# Patient Record
Sex: Male | Born: 1980 | Race: Black or African American | Hispanic: No | Marital: Married | Smoking: Current some day smoker
Health system: Southern US, Community
[De-identification: ages and names within clinical notes are randomized; demographics above are authoritative.]

## PROBLEM LIST (undated history)

## (undated) DIAGNOSIS — F419 Anxiety disorder, unspecified: Secondary | ICD-10-CM

## (undated) DIAGNOSIS — I10 Essential (primary) hypertension: Secondary | ICD-10-CM

## (undated) DIAGNOSIS — K219 Gastro-esophageal reflux disease without esophagitis: Secondary | ICD-10-CM

## (undated) DIAGNOSIS — M1990 Unspecified osteoarthritis, unspecified site: Secondary | ICD-10-CM

## (undated) DIAGNOSIS — J45909 Unspecified asthma, uncomplicated: Secondary | ICD-10-CM

## (undated) DIAGNOSIS — R748 Abnormal levels of other serum enzymes: Secondary | ICD-10-CM

## (undated) DIAGNOSIS — D649 Anemia, unspecified: Secondary | ICD-10-CM

## (undated) DIAGNOSIS — T4145XA Adverse effect of unspecified anesthetic, initial encounter: Secondary | ICD-10-CM

## (undated) DIAGNOSIS — F1010 Alcohol abuse, uncomplicated: Secondary | ICD-10-CM

## (undated) DIAGNOSIS — T8859XA Other complications of anesthesia, initial encounter: Secondary | ICD-10-CM

## (undated) HISTORY — PX: EYE SURGERY: SHX253

## (undated) HISTORY — PX: MOUTH SURGERY: SHX715

## (undated) HISTORY — PX: WISDOM TOOTH EXTRACTION: SHX21

## (undated) HISTORY — PX: KNEE SURGERY: SHX244

---

## 2007-09-08 ENCOUNTER — Emergency Department: Payer: Self-pay | Admitting: Unknown Physician Specialty

## 2009-12-26 ENCOUNTER — Inpatient Hospital Stay: Payer: Self-pay | Admitting: Psychiatry

## 2011-06-16 ENCOUNTER — Ambulatory Visit: Payer: Self-pay | Admitting: Sports Medicine

## 2011-11-01 ENCOUNTER — Ambulatory Visit: Payer: Self-pay | Admitting: Orthopedic Surgery

## 2015-02-15 NOTE — Op Note (Signed)
PATIENT NAME:  Russell Hansen, Russell Hansen MR#:  [id number] DATE OF BIRTH:  [birth year]  DATE OF PROCEDURE:  11/01/2011  PREOPERATIVE DIAGNOSIS: Right knee anterior cruciate ligament tear.   POSTOPERATIVE DIAGNOSIS: Right knee anterior cruciate ligament tear.   PROCEDURE: Arthroscopically assisted anterior cruciate ligament reconstruction using autograft patellar bone tendon bone.   SURGEON: Leitha Schuller, MD  ASSISTANT: Thompson Grayer, PA-C.   ANESTHESIA: General.    DESCRIPTION OF PROCEDURE: Patient brought to the Operating Room and after adequate anesthesia was obtained, the leg was placed in the [facility] legholder. After patient identification and timeout procedures were completed, an inferolateral portal was made and the arthroscope was introduced. Inspection revealed normal-appearing patellofemoral joint. No significant arthritis or cartilage wear. Coming around medially, an inferomedial portal was made and on probing the medial meniscus was intact as was the articular cartilage. The anterior cruciate ligament was torn off the femoral attachment with complete tear. It was stuck down to the posterior cruciate ligament and was totally incompetent and had complete rupture. The lateral compartment was also normal with intact lateral meniscus. Gutters had no loose bodies. At this point, the old anterior cruciate ligament was debrided with a small residual stump left for placement of the tibial tunnel subsequently. The lateral wall was also cleaned off of soft tissue attachments. At this point the arthroscope was withdrawn and the patellar tendon autograft obtained. A midline skin incision was made followed by elevation of the peritenon and exposure of the tendon, measured approximately 35 mm at the distal end. A 10 mm graft harvesting scalpel was used then to cut the tendon longitudinally and resection of approximately 20 mm of bone off the tibia and approximately 18 mm off the patella was obtained.  After contouring the graft it fit through 10 mm trial tunnels. Drill holes were placed in the graft for subsequent passage of the graft. Bone graft was obtained from the proximal tibia to be impacted in the space of the patella to try to minimize the defect. There is no penetration into the joint the patellar harvesting. The peritenon was repaired after loosely closing the gap in the tendon followed by 2-0 closure of the skin. After closure of the peritenon the inferior medial aspect of the incision was subsequently used for the tibial tunnel. No additional incision was needed for that. After measuring and seeing that the graft could pass through a 10 mm tunnel guidewire was inserted through the Arthrex guide to the center of the anterior cruciate ligament footprint on the tibia and a 10 mm hole made. The residual anterior cruciate ligament stump was removed to prevent impingement as the graft would be passed. Through the tibial tunnel transtibial guide was inserted and at about a 11 o'clock position going through the old footprint of the anterior cruciate ligament Beath needle was driven through the femur out the anterolateral thigh in an appropriate position. This was then reamed with a 10 mm acorn reamer and the graft then passed. An 8 x 23 mm Milagro interference screw was inserted and this gave stable fixation on the femoral side. On the tibial side a large portion of the bony graft was really sticking outside the bone tunnel and it was felt the interference screw fixation would not be appropriate and so a bone staple was used to fix the graft on the tibial side first using an osteotome to make a slightly recessed bed so that the bone would not be so prominent. With the graft tensioned in extension  the staple was applied and a suture from the tendon was passed around the staple for additional fixation as a post. The Lachman was very stable at this point and the anterior cruciate ligament did not show signs of  impingement in extension. On examination arthroscopically the graft appeared stable and appropriate. The knee was thoroughly irrigated. The skin incision was closed with using 2-0 Vicryl and then skin staples. 20 mL of 0.5% Sensorcaine with epinephrine was infiltrated in the area of the incisions and into the joint. The wound was then dressed with Xeroform, 4 x 4's, ABD, Webril, ace wrap and knee immobilizer along with a Polar Care. The patient was sent to recovery in stable condition. Tourniquet was let down at the close of the case. It was raised harvesting the patellar tendon. Total tourniquet time was 85 minutes at 300 mmHg.   ESTIMATED BLOOD LOSS: 50.   COMPLICATIONS: None.   SPECIMEN: None.   IMPLANTS: 8 x 23 Milagro interference screw from [employer] and one Zimmer bone staple.   ____________________________ Leitha Schuller, MD mjm:cms D: 11/01/2011 21:21:59 ET T: 11/02/2011 08:27:41 ET JOB#: [id number]  cc: Leitha Schuller, MD, <Dictator> Leitha Schuller MD ELECTRONICALLY SIGNED 11/02/2011 12:28

## 2016-08-02 ENCOUNTER — Emergency Department
Admission: EM | Admit: 2016-08-02 | Discharge: 2016-08-02 | Disposition: A | Discharge status: Home / Self Care | Payer: BC Managed Care – PPO | Attending: Emergency Medicine | Admitting: Emergency Medicine

## 2016-08-02 ENCOUNTER — Encounter: Payer: Self-pay | Admitting: Emergency Medicine

## 2016-08-02 DIAGNOSIS — R42 Dizziness and giddiness: Secondary | ICD-10-CM | POA: Diagnosis present

## 2016-08-02 DIAGNOSIS — I10 Essential (primary) hypertension: Secondary | ICD-10-CM | POA: Diagnosis not present

## 2016-08-02 HISTORY — DX: Anxiety disorder, unspecified: F41.9

## 2016-08-02 HISTORY — DX: Essential (primary) hypertension: I10

## 2016-08-02 HISTORY — DX: Alcohol abuse, uncomplicated: F10.10

## 2016-08-02 LAB — COMPREHENSIVE METABOLIC PANEL
ALK PHOS: 98 U/L (ref 38–126)
ALT: 181 U/L — AB (ref 17–63)
ANION GAP: 11 (ref 5–15)
AST: 498 U/L — AB (ref 15–41)
Albumin: 4.3 g/dL (ref 3.5–5.0)
BILIRUBIN TOTAL: 1.3 mg/dL — AB (ref 0.3–1.2)
BUN: 8 mg/dL (ref 6–20)
CHLORIDE: 105 mmol/L (ref 101–111)
CO2: 22 mmol/L (ref 22–32)
Calcium: 9.3 mg/dL (ref 8.9–10.3)
Creatinine, Ser: 1.03 mg/dL (ref 0.61–1.24)
GFR calc Af Amer: >60 mL/min (ref >60)
Glucose, Bld: 115 mg/dL — ABNORMAL HIGH (ref 65–99)
POTASSIUM: 3.2 mmol/L — AB (ref 3.5–5.1)
Sodium: 138 mmol/L (ref 135–145)
TOTAL PROTEIN: 7.7 g/dL (ref 6.5–8.1)

## 2016-08-02 LAB — CBC
HEMATOCRIT: 45.1 % (ref 40.0–52.0)
HEMOGLOBIN: 15.6 g/dL (ref 13.0–18.0)
MCH: 34.6 pg — ABNORMAL HIGH (ref 26.0–34.0)
MCHC: 34.6 g/dL (ref 32.0–36.0)
MCV: 100.2 fL — AB (ref 80.0–100.0)
Platelets: 333 10*3/uL (ref 150–440)
RBC: 4.5 MIL/uL (ref 4.40–5.90)
RDW: 13.2 % (ref 11.5–14.5)
WBC: 7.2 10*3/uL (ref 3.8–10.6)

## 2016-08-02 LAB — TROPONIN I: Troponin I: <0.03 ng/mL (ref <0.03)

## 2016-08-02 MED ORDER — LORAZEPAM 1 MG PO TABS
ORAL_TABLET | ORAL | Status: AC
  Administered 2016-08-02: 1 mg
  Filled 2016-08-02: qty 1

## 2016-08-02 MED ORDER — SODIUM CHLORIDE 0.9 % IV BOLUS (SEPSIS)
1000.0000 mL | Freq: Once | INTRAVENOUS | Status: AC
  Administered 2016-08-02: 1000 mL via INTRAVENOUS

## 2016-08-02 MED ORDER — SODIUM CHLORIDE 0.9 % IV SOLN
1000.0000 mL | Freq: Once | INTRAVENOUS | Status: AC
  Administered 2016-08-02: 1000 mL via INTRAVENOUS

## 2016-08-02 NOTE — ED Provider Notes (Signed)
[facility] Emergency Department Provider Note   ____________________________________________    I have reviewed the triage vital signs and the nursing notes.   HISTORY  Chief Complaint Dizziness    HPI Russell Hansen is a 35 y.o. male who presents with dizziness. Patient reports that over the last 2 days when he has stood up he has felt lightheaded and dizzy. He felt like he may lose his balance. He does have a history of alcohol abuse in the past but denies heavy drinking over the last several days. He reports he drank one beer 4 days ago. No chest pain, no nausea vomiting or palpitations. No history of significant alcohol withdrawal.   Past Medical History:  Diagnosis Date  . Alcohol abuse   . Anxiety   . Hypertension     There are no active problems to display for this patient.   Past Surgical History:  Procedure Laterality Date  . KNEE SURGERY      Prior to Admission medications   Not on File     Allergies Doxycycline  No family history on file.  Social History Social History  Substance Use Topics  . Smoking status: Never Smoker  . Smokeless tobacco: Never Used  . Alcohol use Yes    Review of Systems  Constitutional: No fever/chills Eyes: No visual changes.   Cardiovascular: Denies chest pain.No palpitations Respiratory: Denies shortness of breath. Gastrointestinal: No abdominal pain.  No nausea, no vomiting.   Genitourinary: Negative for hematuria Musculoskeletal: Negative for back pain. Skin: Negative for rash. Neurological: Negative for headaches or weakness  10-point ROS otherwise negative.  ____________________________________________   PHYSICAL EXAM:  VITAL SIGNS: ED Triage Vitals  Enc Vitals Group     BP 08/02/16 0713 (!) 136/118     Pulse Rate 08/02/16 0713 (!) 127     Resp 08/02/16 0713 16     Temp 08/02/16 0713 97.6 F (36.4 C)     Temp Source 08/02/16 0713 Oral     SpO2 08/02/16 0713 96  %     Weight 08/02/16 0714 150 lb (68 kg)     Height 08/02/16 0714 5\' 8"  (1.727 m)     Head Circumference --      Peak Flow --      Pain Score 08/02/16 0718 0     Pain Loc --      Pain Edu? --      Excl. in GC? --     Constitutional: Alert and oriented. No acute distress. Pleasant and interactive Eyes: Conjunctivae are normal. No nystagmus  Nose: No congestion/rhinnorhea. Mouth/Throat: Mucous membranes are moist.    Cardiovascular: Normal rate, regular rhythm. Grossly normal heart sounds.  Good peripheral circulation. Respiratory: Normal respiratory effort.  No retractions. Lungs CTAB. Gastrointestinal: Soft and nontender. No distention.  No CVA tenderness. Genitourinary: deferred Musculoskeletal: No lower extremity tenderness nor edema.  Warm and well perfused Neurologic:  Normal speech and language. No gross focal neurologic deficits are appreciated.  Skin:  Skin is warm, dry and intact. No rash noted. Psychiatric: Mood and affect are normal. Speech and behavior are normal.  ____________________________________________   LABS (all labs ordered are listed, but only abnormal results are displayed)  Labs Reviewed  CBC  COMPREHENSIVE METABOLIC PANEL  TROPONIN I   ____________________________________________  EKG  ED ECG REPORT I, Jene Every, the attending physician, personally viewed and interpreted this ECG.  Date: 08/02/2016 EKG Time: 7:47 AM Rate: 83 Rhythm: normal sinus rhythm  QRS Axis: normal Intervals: normal ST/T Wave abnormalities: normal Conduction Disturbances: none Narrative Interpretation: unremarkable  ____________________________________________  RADIOLOGY  None ____________________________________________   PROCEDURES  Procedure(s) performed: No    Critical Care performed:No ____________________________________________   INITIAL IMPRESSION / ASSESSMENT AND PLAN / ED COURSE  Pertinent labs & imaging results that were available  during my care of the patient were reviewed by me and considered in my medical decision making (see chart for details).  Patient well-appearing and in no distress. Initial heart rate was elevated however when lying in bed patient's heart rate is quite normal. Patient appears calm, no shaking. Blood pressure is elevated but he has a history of hypertension and takes amlodipine for this. We will check orthostatics, labs, give IV fluids and recheck.  Clinical Course  After IV fluids, patient felt significantly better. He no longer has any dizziness upon standing. He continues to have a heart rate which fluctuates rapidly especially when medical provider walks into the room which I suspect is related to anxiety. We discussed outpatient follow-up and need to return if any change in symptoms. He agrees with this plan. No evidence that his symptoms are related to alcohol withdrawal ____________________________________________   FINAL CLINICAL IMPRESSION(S) / ED DIAGNOSES  Final diagnoses:  Dizziness      NEW MEDICATIONS STARTED DURING THIS VISIT:  New Prescriptions   No medications on file     Note:  This document was prepared using Dragon voice recognition software and may include unintentional dictation errors.    Jene Everyobert Arretta Toenjes, MD 08/02/16 [phone]

## 2016-08-02 NOTE — ED Triage Notes (Signed)
Pt reports being in alcohol withdrawal. Pt reports last alcoholic drink. Pt requesting help with sx of withdrawal. Pt denies SI, HI or hallucinations.

## 2016-10-11 ENCOUNTER — Other Ambulatory Visit: Payer: Self-pay | Admitting: Student

## 2016-10-11 DIAGNOSIS — R7989 Other specified abnormal findings of blood chemistry: Secondary | ICD-10-CM

## 2016-10-11 DIAGNOSIS — R945 Abnormal results of liver function studies: Principal | ICD-10-CM

## 2016-10-13 ENCOUNTER — Ambulatory Visit: Payer: BC Managed Care – PPO

## 2016-10-14 ENCOUNTER — Ambulatory Visit
Admission: RE | Admit: 2016-10-14 | Discharge: 2016-10-14 | Disposition: A | Discharge status: Home / Self Care | Payer: BC Managed Care – PPO | Source: Ambulatory Visit | Attending: Student | Admitting: Student

## 2016-10-14 DIAGNOSIS — R7989 Other specified abnormal findings of blood chemistry: Secondary | ICD-10-CM

## 2016-10-14 DIAGNOSIS — R932 Abnormal findings on diagnostic imaging of liver and biliary tract: Secondary | ICD-10-CM | POA: Diagnosis not present

## 2016-10-14 DIAGNOSIS — R945 Abnormal results of liver function studies: Secondary | ICD-10-CM

## 2017-09-04 DIAGNOSIS — E559 Vitamin D deficiency, unspecified: Secondary | ICD-10-CM | POA: Insufficient documentation

## 2017-09-04 HISTORY — DX: Vitamin D deficiency, unspecified: E55.9

## 2017-09-05 DIAGNOSIS — R809 Proteinuria, unspecified: Secondary | ICD-10-CM | POA: Insufficient documentation

## 2017-09-05 HISTORY — DX: Proteinuria, unspecified: R80.9

## 2018-01-01 ENCOUNTER — Other Ambulatory Visit: Payer: Self-pay | Admitting: Orthopedic Surgery

## 2018-01-01 DIAGNOSIS — S83261A Peripheral tear of lateral meniscus, current injury, right knee, initial encounter: Secondary | ICD-10-CM

## 2018-01-17 ENCOUNTER — Ambulatory Visit
Admission: RE | Admit: 2018-01-17 | Discharge: 2018-01-17 | Disposition: A | Discharge status: Home / Self Care | Payer: BC Managed Care – PPO | Source: Ambulatory Visit | Attending: Orthopedic Surgery | Admitting: Orthopedic Surgery

## 2018-01-17 DIAGNOSIS — S83261A Peripheral tear of lateral meniscus, current injury, right knee, initial encounter: Secondary | ICD-10-CM | POA: Diagnosis not present

## 2018-01-17 DIAGNOSIS — Z9889 Other specified postprocedural states: Secondary | ICD-10-CM | POA: Diagnosis not present

## 2018-01-17 DIAGNOSIS — Z9689 Presence of other specified functional implants: Secondary | ICD-10-CM | POA: Insufficient documentation

## 2018-02-26 ENCOUNTER — Encounter
Admission: RE | Admit: 2018-02-26 | Discharge: 2018-02-26 | Disposition: A | Discharge status: Home / Self Care | Payer: BC Managed Care – PPO | Source: Ambulatory Visit | Attending: Orthopedic Surgery | Admitting: Orthopedic Surgery

## 2018-02-26 ENCOUNTER — Other Ambulatory Visit: Payer: Self-pay

## 2018-02-26 HISTORY — DX: Other complications of anesthesia, initial encounter: T88.59XA

## 2018-02-26 HISTORY — DX: Unspecified asthma, uncomplicated: J45.909

## 2018-02-26 HISTORY — DX: Adverse effect of unspecified anesthetic, initial encounter: T41.45XA

## 2018-02-26 HISTORY — DX: Gastro-esophageal reflux disease without esophagitis: K21.9

## 2018-02-26 NOTE — Patient Instructions (Signed)
Your procedure is scheduled on: 03-05-18 MONDAY Report to Same Day Surgery 2nd floor medical mall [facility] Entrance-take elevator on left to 2nd floor.  Check in with surgery information desk.) To find out your arrival time please call [phone] between 1PM - 3PM on 03-02-18 FRIDAY  Remember: Instructions that are not followed completely may result in serious medical risk, up to and including death, or upon the discretion of your surgeon and anesthesiologist your surgery may need to be rescheduled.    _x___ 1. Do not eat food after midnight the night before your procedure. NO GUM OR CANDY AFTER MIDNIGHT.  You may drink clear liquids up to 2 hours before you are scheduled to arrive at the hospital for your procedure.  Do not drink clear liquids within 2 hours of your scheduled arrival to the hospital.  Clear liquids include  --Water or Apple juice without pulp  --Clear carbohydrate beverage such as ClearFast or Gatorade  --Black Coffee or Clear Tea (No milk, no creamers, do not add anything to the coffee or Tea     __x__ 2. No Alcohol for 24 hours before or after surgery.   __x__3. No Smoking or e-cigarettes for 24 prior to surgery.  Do not use any chewable tobacco products for at least 6 hour prior to surgery   ____  4. Bring all medications with you on the day of surgery if instructed.    __x__ 5. Notify your doctor if there is any change in your medical condition     (cold, fever, infections).    x___6. On the morning of surgery brush your teeth with toothpaste and water.  You may rinse your mouth with mouth wash if you wish.  Do not swallow any toothpaste or mouthwash.   Do not wear jewelry, make-up, hairpins, clips or nail polish.  Do not wear lotions, powders, or perfumes. You may wear deodorant.  Do not shave 48 hours prior to surgery. Men may shave face and neck.  Do not bring valuables to the hospital.    [facility] is not responsible for any belongings or  valuables.               Contacts, dentures or bridgework may not be worn into surgery.  Leave your suitcase in the car. After surgery it may be brought to your room.  For patients admitted to the hospital, discharge time is determined by your treatment team.  _  Patients discharged the day of surgery will not be allowed to drive home.  You will need someone to drive you home and stay with you the night of your procedure.    Please read over the following fact sheets that you were given:   [facility] Preparing for Surgery and or MRSA Information   _x___ TAKE THE FOLLOWING MEDICATION THE MORNING OF SURGERY WITH A SMALL SIP OF WATER. These include:  1. AMLODIPINE (NORVASC)  2. PEPCID  3. TAKE A PEPCID THE NIGHT BEFORE YOUR SURGERY  4.  5.  6.  ____Fleets enema or Magnesium Citrate as directed.   _x___ Use CHG Soap or sage wipes as directed on instruction sheet   ____ Use inhalers on the day of surgery and bring to hospital day of surgery  ____ Stop Metformin and Janumet 2 days prior to surgery.    ____ Take 1/2 of usual insulin dose the night before surgery and none on the morning surgery.   ____ Follow recommendations from Cardiologist, Pulmonologist or PCP  regarding stopping Aspirin, Coumadin, Plavix ,Eliquis, Effient, or Pradaxa, and Pletal.  X____Stop Anti-inflammatories such as Advil, Aleve, Ibuprofen, Motrin, Naproxen, Naprosyn, Goodies powders or aspirin products NOW-OK to take Tylenol    ____ Stop supplements until after surgery.     ____ Bring C-Pap to the hospital.

## 2018-02-27 ENCOUNTER — Encounter
Admission: RE | Admit: 2018-02-27 | Discharge: 2018-02-27 | Disposition: A | Discharge status: Home / Self Care | Payer: BC Managed Care – PPO | Source: Ambulatory Visit | Attending: Orthopedic Surgery | Admitting: Orthopedic Surgery

## 2018-02-27 DIAGNOSIS — I119 Hypertensive heart disease without heart failure: Secondary | ICD-10-CM | POA: Insufficient documentation

## 2018-02-27 DIAGNOSIS — X58XXXA Exposure to other specified factors, initial encounter: Secondary | ICD-10-CM | POA: Insufficient documentation

## 2018-02-27 DIAGNOSIS — S83241A Other tear of medial meniscus, current injury, right knee, initial encounter: Secondary | ICD-10-CM | POA: Insufficient documentation

## 2018-03-02 ENCOUNTER — Encounter: Payer: Self-pay | Admitting: *Deleted

## 2018-03-04 MED ORDER — CEFAZOLIN SODIUM-DEXTROSE 1-4 GM/50ML-% IV SOLN
1.0000 g | Freq: Once | INTRAVENOUS | Status: AC
  Administered 2018-03-05: 1 g via INTRAVENOUS

## 2018-03-05 ENCOUNTER — Ambulatory Visit: Payer: BC Managed Care – PPO | Admitting: Anesthesiology

## 2018-03-05 ENCOUNTER — Ambulatory Visit: Payer: BC Managed Care – PPO

## 2018-03-05 ENCOUNTER — Encounter
Admission: RE | Disposition: A | Discharge status: Home / Self Care | Payer: Self-pay | Source: Ambulatory Visit | Attending: Orthopedic Surgery

## 2018-03-05 ENCOUNTER — Other Ambulatory Visit: Payer: Self-pay

## 2018-03-05 ENCOUNTER — Ambulatory Visit
Admission: RE | Admit: 2018-03-05 | Discharge: 2018-03-05 | Disposition: A | Discharge status: Home / Self Care | Payer: BC Managed Care – PPO | Source: Ambulatory Visit | Attending: Orthopedic Surgery | Admitting: Orthopedic Surgery

## 2018-03-05 DIAGNOSIS — K219 Gastro-esophageal reflux disease without esophagitis: Secondary | ICD-10-CM | POA: Insufficient documentation

## 2018-03-05 DIAGNOSIS — Z79899 Other long term (current) drug therapy: Secondary | ICD-10-CM | POA: Diagnosis not present

## 2018-03-05 DIAGNOSIS — M1711 Unilateral primary osteoarthritis, right knee: Secondary | ICD-10-CM | POA: Diagnosis not present

## 2018-03-05 DIAGNOSIS — I10 Essential (primary) hypertension: Secondary | ICD-10-CM | POA: Insufficient documentation

## 2018-03-05 DIAGNOSIS — F419 Anxiety disorder, unspecified: Secondary | ICD-10-CM | POA: Insufficient documentation

## 2018-03-05 DIAGNOSIS — Z881 Allergy status to other antibiotic agents status: Secondary | ICD-10-CM | POA: Diagnosis not present

## 2018-03-05 DIAGNOSIS — M23221 Derangement of posterior horn of medial meniscus due to old tear or injury, right knee: Secondary | ICD-10-CM | POA: Insufficient documentation

## 2018-03-05 DIAGNOSIS — J45990 Exercise induced bronchospasm: Secondary | ICD-10-CM | POA: Diagnosis not present

## 2018-03-05 DIAGNOSIS — R0902 Hypoxemia: Secondary | ICD-10-CM

## 2018-03-05 HISTORY — PX: CHONDROPLASTY: SHX5177

## 2018-03-05 HISTORY — DX: Abnormal levels of other serum enzymes: R74.8

## 2018-03-05 HISTORY — PX: KNEE ARTHROSCOPY WITH MENISCAL REPAIR: SHX5653

## 2018-03-05 SURGERY — ARTHROSCOPY, KNEE, WITH MENISCUS REPAIR
Anesthesia: General | Site: Knee | Laterality: Right | Wound class: Clean

## 2018-03-05 MED ORDER — HYDROMORPHONE HCL 1 MG/ML IJ SOLN
INTRAMUSCULAR | Status: AC
  Filled 2018-03-05: qty 1

## 2018-03-05 MED ORDER — FAMOTIDINE 20 MG PO TABS
ORAL_TABLET | ORAL | Status: AC
  Administered 2018-03-05: 20 mg via ORAL
  Filled 2018-03-05: qty 1

## 2018-03-05 MED ORDER — IPRATROPIUM-ALBUTEROL 0.5-2.5 (3) MG/3ML IN SOLN
RESPIRATORY_TRACT | Status: AC
  Filled 2018-03-05: qty 3

## 2018-03-05 MED ORDER — ALBUTEROL SULFATE HFA 108 (90 BASE) MCG/ACT IN AERS: 2.0000 | INHALATION_SPRAY | Freq: Four times a day (QID) | RESPIRATORY_TRACT | 0 refills | Status: DC | PRN

## 2018-03-05 MED ORDER — KETOROLAC TROMETHAMINE 30 MG/ML IJ SOLN
INTRAMUSCULAR | Status: DC | PRN
  Administered 2018-03-05: 30 mg via INTRAVENOUS

## 2018-03-05 MED ORDER — EPHEDRINE SULFATE 50 MG/ML IJ SOLN
INTRAMUSCULAR | Status: DC | PRN
  Administered 2018-03-05: 10 mg via INTRAVENOUS

## 2018-03-05 MED ORDER — OXYCODONE HCL 5 MG/5ML PO SOLN: 5.0000 mg | Freq: Once | ORAL | Status: DC | PRN

## 2018-03-05 MED ORDER — ONDANSETRON HCL 4 MG/2ML IJ SOLN
INTRAMUSCULAR | Status: AC
  Filled 2018-03-05: qty 2

## 2018-03-05 MED ORDER — HYDROCODONE-ACETAMINOPHEN 5-325 MG PO TABS: 1.0000 | ORAL_TABLET | ORAL | 0 refills | Status: DC | PRN

## 2018-03-05 MED ORDER — ONDANSETRON 4 MG PO TBDP: 4.0000 mg | ORAL_TABLET | Freq: Three times a day (TID) | ORAL | 0 refills | Status: DC | PRN

## 2018-03-05 MED ORDER — FENTANYL CITRATE (PF) 100 MCG/2ML IJ SOLN
INTRAMUSCULAR | Status: DC | PRN
  Administered 2018-03-05: 100 ug via INTRAVENOUS

## 2018-03-05 MED ORDER — BUPIVACAINE HCL (PF) 0.5 % IJ SOLN
INTRAMUSCULAR | Status: AC
  Filled 2018-03-05: qty 30

## 2018-03-05 MED ORDER — FENTANYL CITRATE (PF) 100 MCG/2ML IJ SOLN
INTRAMUSCULAR | Status: AC
  Filled 2018-03-05: qty 2

## 2018-03-05 MED ORDER — FUROSEMIDE 10 MG/ML IJ SOLN
INTRAMUSCULAR | Status: AC
  Filled 2018-03-05: qty 2

## 2018-03-05 MED ORDER — IBUPROFEN 800 MG PO TABS: 800.0000 mg | ORAL_TABLET | Freq: Three times a day (TID) | ORAL | 0 refills | Status: AC

## 2018-03-05 MED ORDER — MIDAZOLAM HCL 2 MG/2ML IJ SOLN
INTRAMUSCULAR | Status: DC | PRN
  Administered 2018-03-05: 2 mg via INTRAVENOUS

## 2018-03-05 MED ORDER — OXYCODONE HCL 5 MG PO TABS: 5.0000 mg | ORAL_TABLET | Freq: Once | ORAL | Status: DC | PRN

## 2018-03-05 MED ORDER — PHENYLEPHRINE HCL 10 MG/ML IJ SOLN
INTRAMUSCULAR | Status: AC
  Filled 2018-03-05: qty 1

## 2018-03-05 MED ORDER — FUROSEMIDE 10 MG/ML IJ SOLN
10.0000 mg | Freq: Once | INTRAMUSCULAR | Status: AC
  Administered 2018-03-05: 10 mg via INTRAVENOUS

## 2018-03-05 MED ORDER — PROPOFOL 10 MG/ML IV BOLUS
INTRAVENOUS | Status: AC
  Filled 2018-03-05: qty 20

## 2018-03-05 MED ORDER — LIDOCAINE-EPINEPHRINE 1 %-1:100000 IJ SOLN
INTRAMUSCULAR | Status: DC | PRN
  Administered 2018-03-05: 3 mL
  Administered 2018-03-05: 10 mL

## 2018-03-05 MED ORDER — LIDOCAINE HCL (CARDIAC) PF 100 MG/5ML IV SOSY
PREFILLED_SYRINGE | INTRAVENOUS | Status: DC | PRN
  Administered 2018-03-05: 100 mg via INTRAVENOUS

## 2018-03-05 MED ORDER — LACTATED RINGERS IV SOLN
INTRAVENOUS | Status: DC
  Administered 2018-03-05 (×2): via INTRAVENOUS

## 2018-03-05 MED ORDER — IPRATROPIUM-ALBUTEROL 0.5-2.5 (3) MG/3ML IN SOLN
3.0000 mL | Freq: Once | RESPIRATORY_TRACT | Status: AC
  Administered 2018-03-05: 3 mL via RESPIRATORY_TRACT

## 2018-03-05 MED ORDER — ASPIRIN EC 325 MG PO TBEC: 325.0000 mg | DELAYED_RELEASE_TABLET | Freq: Every day | ORAL | 0 refills | Status: AC

## 2018-03-05 MED ORDER — DEXAMETHASONE SODIUM PHOSPHATE 10 MG/ML IJ SOLN
INTRAMUSCULAR | Status: DC | PRN
  Administered 2018-03-05: 10 mg via INTRAVENOUS

## 2018-03-05 MED ORDER — FENTANYL CITRATE (PF) 100 MCG/2ML IJ SOLN: 25.0000 ug | INTRAMUSCULAR | Status: DC | PRN

## 2018-03-05 MED ORDER — ONDANSETRON HCL 4 MG/2ML IJ SOLN
INTRAMUSCULAR | Status: DC | PRN
  Administered 2018-03-05: 4 mg via INTRAVENOUS

## 2018-03-05 MED ORDER — LIDOCAINE-EPINEPHRINE 1 %-1:100000 IJ SOLN
INTRAMUSCULAR | Status: AC
  Filled 2018-03-05: qty 1

## 2018-03-05 MED ORDER — HYDROMORPHONE HCL 1 MG/ML IJ SOLN
INTRAMUSCULAR | Status: DC | PRN
  Administered 2018-03-05: 0.5 mg via INTRAVENOUS

## 2018-03-05 MED ORDER — LIDOCAINE HCL (PF) 2 % IJ SOLN
INTRAMUSCULAR | Status: AC
  Filled 2018-03-05: qty 10

## 2018-03-05 MED ORDER — EPHEDRINE SULFATE 50 MG/ML IJ SOLN
INTRAMUSCULAR | Status: AC
  Filled 2018-03-05: qty 1

## 2018-03-05 MED ORDER — ACETAMINOPHEN 10 MG/ML IV SOLN
INTRAVENOUS | Status: AC
  Filled 2018-03-05: qty 100

## 2018-03-05 MED ORDER — FAMOTIDINE 20 MG PO TABS
20.0000 mg | ORAL_TABLET | Freq: Once | ORAL | Status: AC
  Administered 2018-03-05: 20 mg via ORAL

## 2018-03-05 MED ORDER — ACETAMINOPHEN 10 MG/ML IV SOLN
INTRAVENOUS | Status: DC | PRN
  Administered 2018-03-05: 1000 mg via INTRAVENOUS

## 2018-03-05 MED ORDER — MIDAZOLAM HCL 2 MG/2ML IJ SOLN
INTRAMUSCULAR | Status: AC
  Filled 2018-03-05: qty 2

## 2018-03-05 MED ORDER — PROPOFOL 10 MG/ML IV BOLUS
INTRAVENOUS | Status: DC | PRN
  Administered 2018-03-05: 100 mg via INTRAVENOUS
  Administered 2018-03-05: 50 mg via INTRAVENOUS
  Administered 2018-03-05: 60 mg via INTRAVENOUS
  Administered 2018-03-05: 140 mg via INTRAVENOUS
  Administered 2018-03-05: 50 mg via INTRAVENOUS

## 2018-03-05 MED ORDER — DEXAMETHASONE SODIUM PHOSPHATE 10 MG/ML IJ SOLN
INTRAMUSCULAR | Status: AC
  Filled 2018-03-05: qty 1

## 2018-03-05 MED ORDER — SUCCINYLCHOLINE CHLORIDE 20 MG/ML IJ SOLN
INTRAMUSCULAR | Status: DC | PRN
  Administered 2018-03-05: 100 mg via INTRAVENOUS

## 2018-03-05 MED ORDER — CEFAZOLIN SODIUM-DEXTROSE 1-4 GM/50ML-% IV SOLN
INTRAVENOUS | Status: AC
  Filled 2018-03-05: qty 50

## 2018-03-05 MED ORDER — ONDANSETRON HCL 4 MG PO TABS
ORAL_TABLET | ORAL | Status: AC
  Administered 2018-03-05: 4 mg via ORAL
  Filled 2018-03-05: qty 1

## 2018-03-05 MED ORDER — ONDANSETRON HCL 4 MG PO TABS
4.0000 mg | ORAL_TABLET | Freq: Once | ORAL | Status: AC
Start: 2018-03-05 — End: 2018-03-05
  Administered 2018-03-05: 4 mg via ORAL

## 2018-03-05 MED ORDER — KETOROLAC TROMETHAMINE 30 MG/ML IJ SOLN
INTRAMUSCULAR | Status: AC
Start: 2018-03-05 — End: ?
  Filled 2018-03-05: qty 1

## 2018-03-05 SURGICAL SUPPLY — 58 items
ADAPTER IRRIG TUBE 2 SPIKE SOL (ADAPTER) ×6 IMPLANT
BLADE SURG SZ10 CARB STEEL (BLADE) ×3 IMPLANT
BLADE SURG SZ11 CARB STEEL (BLADE) ×3 IMPLANT
BNDG ESMARK 6X12 TAN STRL LF (GAUZE/BANDAGES/DRESSINGS) ×3 IMPLANT
BRUSH SCRUB EZ  4% CHG (MISCELLANEOUS)
BRUSH SCRUB EZ 4% CHG (MISCELLANEOUS) IMPLANT
BUR RADIUS 3.5 (BURR) ×3 IMPLANT
BUR RADIUS 4.0X18.5 (BURR) IMPLANT
CHLORAPREP W/TINT 26ML (MISCELLANEOUS) ×3 IMPLANT
CLOSURE WOUND 1/2 X4 (GAUZE/BANDAGES/DRESSINGS) ×1
COOLER POLAR GLACIER W/PUMP (MISCELLANEOUS) ×3 IMPLANT
CUFF TOURN 24 STER (MISCELLANEOUS) ×3 IMPLANT
CUFF TOURN 30 STER DUAL PORT (MISCELLANEOUS) IMPLANT
CUTTER SUT KNOT PUSHER AIR (CUTTER) ×3 IMPLANT
DEVICE MENISCAL CVD UP (Anchor) ×21 IMPLANT
DRAPE IMP U-DRAPE 54X76 (DRAPES) ×3 IMPLANT
DRAPE LEGGINS SURG 28X43 STRL (DRAPES) ×3 IMPLANT
ELECT REM PT RETURN 9FT ADLT (ELECTROSURGICAL) ×3
ELECTRODE REM PT RTRN 9FT ADLT (ELECTROSURGICAL) ×1 IMPLANT
GAUZE SPONGE 4X4 12PLY STRL (GAUZE/BANDAGES/DRESSINGS) ×3 IMPLANT
GLOVE BIOGEL PI IND STRL 8 (GLOVE) ×1 IMPLANT
GLOVE BIOGEL PI INDICATOR 8 (GLOVE) ×2
GLOVE SURG SYN 7.5  E (GLOVE) ×2
GLOVE SURG SYN 7.5 E (GLOVE) ×1 IMPLANT
GOWN STRL REUS W/ TWL LRG LVL3 (GOWN DISPOSABLE) ×1 IMPLANT
GOWN STRL REUS W/ TWL XL LVL3 (GOWN DISPOSABLE) ×1 IMPLANT
GOWN STRL REUS W/TWL LRG LVL3 (GOWN DISPOSABLE) ×2
GOWN STRL REUS W/TWL XL LVL3 (GOWN DISPOSABLE) ×2
IV LACTATED RINGER IRRG 3000ML (IV SOLUTION) ×8
IV LR IRRIG 3000ML ARTHROMATIC (IV SOLUTION) ×4 IMPLANT
KIT TURNOVER KIT A (KITS) ×3 IMPLANT
MANIFOLD NEPTUNE II (INSTRUMENTS) ×3 IMPLANT
MAT BLUE FLOOR 46X72 FLO (MISCELLANEOUS) ×3 IMPLANT
NEEDLE HYPO 22GX1.5 SAFETY (NEEDLE) ×3 IMPLANT
NEEDLE MAYO 6 CRC TAPER PT (NEEDLE) IMPLANT
PACK ARTHROSCOPY KNEE (MISCELLANEOUS) ×3 IMPLANT
PAD ABD DERMACEA PRESS 5X9 (GAUZE/BANDAGES/DRESSINGS) ×6 IMPLANT
PAD WRAPON POLAR KNEE (MISCELLANEOUS) ×1 IMPLANT
PADDING CAST 6X4YD NS (MISCELLANEOUS) ×2
PADDING CAST COTTON 6X4 NS (MISCELLANEOUS) ×1 IMPLANT
PENCIL ELECTRO HAND CTR (MISCELLANEOUS) IMPLANT
SET TUBE SUCT SHAVER OUTFL 24K (TUBING) ×3 IMPLANT
SET TUBE TIP INTRA-ARTICULAR (MISCELLANEOUS) ×3 IMPLANT
STRIP CLOSURE SKIN 1/2X4 (GAUZE/BANDAGES/DRESSINGS) ×2 IMPLANT
SUT ETHILON 3-0 FS-10 30 BLK (SUTURE) ×3
SUT MNCRL 4-0 (SUTURE) ×2
SUT MNCRL 4-0 27XMFL (SUTURE) ×1
SUT VIC AB 0 SH 27 (SUTURE) ×3 IMPLANT
SUT VIC AB 2-0 SH 27 (SUTURE) ×2
SUT VIC AB 2-0 SH 27XBRD (SUTURE) ×1 IMPLANT
SUTURE EHLN 3-0 FS-10 30 BLK (SUTURE) ×1 IMPLANT
SUTURE MNCRL 4-0 27XMF (SUTURE) ×1 IMPLANT
TAPE TRANSPORE STRL 2 31045 (GAUZE/BANDAGES/DRESSINGS) ×3 IMPLANT
TOWEL OR 17X26 4PK STRL BLUE (TOWEL DISPOSABLE) ×6 IMPLANT
TUBING ARTHRO INFLOW-ONLY STRL (TUBING) ×3 IMPLANT
WAND HAND CNTRL MULTIVAC 50 (MISCELLANEOUS) IMPLANT
WAND HAND CNTRL MULTIVAC 90 (MISCELLANEOUS) ×3 IMPLANT
WRAPON POLAR PAD KNEE (MISCELLANEOUS) ×3

## 2018-03-05 NOTE — H&P (Signed)
Paper H&P to be scanned into permanent record. H&P reviewed. No significant changes noted.  Heart: regular rate and rhythm Lungs: chest sounds clear   

## 2018-03-05 NOTE — Anesthesia Postprocedure Evaluation (Signed)
Anesthesia Post Note  Patient: Russell Hansen  Procedure(s) Performed: KNEE ARTHROSCOPY WITH MENISCAL REPAIR (Right Knee) CHONDROPLASTY (Right Knee)  Patient location during evaluation: PACU Anesthesia Type: General Level of consciousness: awake and alert Pain management: pain level controlled Vital Signs Assessment: post-procedure vital signs reviewed and stable Respiratory status: spontaneous breathing, nonlabored ventilation, respiratory function stable and patient connected to nasal cannula oxygen Cardiovascular status: blood pressure returned to baseline and stable Postop Assessment: no apparent nausea or vomiting Comments: Patient had been evaluated and treated for dyspnea and hypoxia in the PACU. Duoneb and Lasix given in PACU.  CXR normal.  Patient now asymptomatic, saturating 92% on room air during our conversation. Plan to transfer to Phase 2 and continue to evaluate there.     Last Vitals:  Vitals:   03/05/18 1030 03/05/18 1043  BP:  123/79  Pulse: (!) 106 97  Resp: 14 12  Temp:    SpO2: 92% 92%    Last Pain:  Vitals:   03/05/18 1043  TempSrc:   PainSc: 0-No pain                 Cleda Mccreedy Nafisa Olds

## 2018-03-05 NOTE — Transfer of Care (Signed)
Immediate Anesthesia Transfer of Care Note  Patient: Russell Hansen  Procedure(s) Performed: KNEE ARTHROSCOPY WITH MENISCAL REPAIR (Right Knee) CHONDROPLASTY (Right Knee)  Patient Location: PACU  Anesthesia Type:General  Level of Consciousness: awake  Airway & Oxygen Therapy: Patient Spontanous Breathing  Post-op Assessment: Report given to RN  Post vital signs: stable  Last Vitals:  Vitals Value Taken Time  BP 127/96 03/05/2018  9:28 AM  Temp    Pulse 96 03/05/2018  9:28 AM  Resp 17 03/05/2018  9:28 AM  SpO2 100 % 03/05/2018  9:28 AM  Vitals shown include unvalidated device data.  Last Pain:  Vitals:   03/05/18 0628  TempSrc: Tympanic  PainSc: 0-No pain         Complications: No apparent anesthesia complications

## 2018-03-05 NOTE — Anesthesia Procedure Notes (Addendum)
Procedure Name: Intubation Date/Time: 03/05/2018 7:48 AM Performed by: Carron Curie, CRNA Pre-anesthesia Checklist: Patient identified, Patient being monitored, Timeout performed, Emergency Drugs available and Suction available Patient Re-evaluated:Patient Re-evaluated prior to induction Oxygen Delivery Method: Circle system utilized Preoxygenation: Pre-oxygenation with 100% oxygen Induction Type: IV induction Ventilation: Mask ventilation without difficulty Laryngoscope Size: Mac and 3 Grade View: Grade I Tube type: Oral Tube size: 7.5 mm Number of attempts: 1 Airway Equipment and Method: Stylet Placement Confirmation: ETT inserted through vocal cords under direct vision,  positive ETCO2 and breath sounds checked- equal and bilateral Secured at: 22 cm Tube secured with: Tape Dental Injury: Teeth and Oropharynx as per pre-operative assessment

## 2018-03-05 NOTE — Op Note (Addendum)
Operative Note   SURGERY DATE: 03/05/2018  PRE-OP DIAGNOSIS:  1. Right medial meniscus tear  POST-OP DIAGNOSIS: 1. Right medial meniscus tear 2. Right trochlear osteoarthritis  PROCEDURES:  1. Right knee arthroscopy, medial meniscus repair 2. Chondroplasty of patellofemoral joint (trochlea)  SURGEON: Rosealee Albee, MD  ANESTHESIA: Gen  ESTIMATED BLOOD LOSS:minimal  TOTAL IV FLUIDS: per anesthesia  INDICATION(S): Russell Hansen is a 37 y.o. male who has had medial and posterior knee pain that is worse with knee in a flexed position and stairs. He may have had a twisting injury while working on his boat and felt his knee pop. Of note, he had a prior ACL reconstruction in 2013. He was recently given a corticosteroid injection that helped. Clinical exam and MRI were consistent with a medial meniscus tear. After discussion of risks, benefits, and alternatives to surgery, the patient elected to proceed. The patient understands that there is a higher risk of re-tear with meniscus repair, but would prefer repair to maintain the normal biomechanics and structure of the knee. The patient is willing to perform the appropriate rehab and maintain weight-bearing restrictions post-operatively.  OPERATIVE FINDINGS:   Examination under anesthesia:A careful examination under anesthesia was performed. Passive range of motion was: Hyperextension: 2. Extension: 0. Flexion: 140. Lachman: normal. Pivot Shift: subtle pivot shift present. Posterior drawer: normal. Varus stability in full extension: normal. Varus stability in 30 degrees of flexion: normal. Valgus stability in full extension: normal. Valgus stability in 30 degrees of flexion: normal.  Intra-operative findings:A thorough arthroscopic examination of the knee was performed. The findings are: 1. Suprapatellar pouch: Normal 2. Undersurface of median ridge: Normal 3. Medial patellar facet: Normal 4. Lateral patellar  facet: Normal 5. Trochlea: Focal area of grade 2-3 degenerative changes on medial trochlea 6. Lateral gutter/popliteus tendon: Normal 7. Hoffa's fat pad: Inflamed 8. Medial gutter/plica: Normal 9. ACL: degenerated/non-functional posterolateral bundle. Anteromedial bundle intact 10. PCL: Normal 11. Medial meniscus: Small vertical tear at the meniscocapsular junction of the posterior horn from just medial to the root to the posterior horn/body junction. 12. Medial compartment cartilage: Grade 1 changes to MFC 13. Lateral meniscus: Normal 14. Lateral compartment cartilage: Grade 1 changes to LFC  OPERATIVE REPORT:   I identified Russell Hansen in the pre-operative holding area. I marked theoperativeknee with my initials. I reviewed the risks and benefits of the proposed surgical intervention, and the patient (and/or patient's guardian) wished to proceed. The patient was transferred to the operative suite and placed in the supine position with all bony prominences padded. Anesthesia was administered. Appropriate IV antibioticswere administered within 30 minutes of incision. The extremity was then prepped and draped in standard fashion. A time out was performed confirming the correct extremity, correct patient, and correct procedure.  Arthroscopy portals were marked. Local anesthetic was injected to the planned portal sites. The anterolateral portalwasestablished with an 11blade. The arthroscope was placed in the anterolateral portal and theninto the suprapatellar pouch. Next the medial portal was established under needle localization. A spinal needle was used to pie-crust the MCL to allow for better visualization and protect the cartilage surfaces during instrumentation. The medial meniscus tear was identified. A diagnostic knee scope was completed with the above findings.   The edges of the meniscus tear and capsule were roughened with a rasp and shaver to create a more optimal  healing surface.  Stryker AIR all-inside device x 3 were used to reduce the torn meniscus to the stable rim and capsule. Two stitches  were placed on the femoral side, and one was placed on the tibial side. The meniscus was probed and felt to be stable. Chondroplasty was performed of the medial trochlea using an oscillating shaver such that loose edges of cartilage were debrided. Microfracture of the intercondylar notch was then performed to allow for improved meniscus healing. Tourniquet was released with time of 49 minutes. Arthroscopic fluid was removed from the joint.  The portals were closed with 3-0 Nylon suture. Sterile dressings included Xeroform, 4x4s, Sof-Rol, and Bias wrap. A Polarcare was placed. A T-scope hinged knee brace was applied.  The patient was then awakened and taken to the PACU hemodynamically stable without complication.   POSTOPERATIVE PLAN: The patient will be discharged home today once they meet PACU criteria. Aspirin 325 mg daily was prescribed for 2 weeks for DVT prophylaxis. Physical therapy will start on POD#3-4.50%WB x 4 weeks. F/U in 2 weeks.

## 2018-03-05 NOTE — Discharge Instructions (Signed)
Arthroscopic Knee Surgery - Meniscus Repair   Post-Op Instructions   1. Bracing or crutches: Crutches will be provided at the time of discharge from the surgery center. Keep brace locked in extension at all times except as directed by physical therapy.    2. Ice: You may be provided with a device [facility]) that allows you to ice the affected area effectively. Otherwise you can ice manually.    3. Driving:  Plan on not driving for at least four weeks. Please note that you are advised NOT to drive while taking narcotic pain medications as you may be impaired and unsafe to drive.   4. Activity: Ankle pumps several times an hour while awake to prevent blood clots. Weight bearing: 50% WEIGHT BEARING FOR 4 WEEKS. No weight bearing going from sit to stand. Do not bend knee more than 90 degrees. Use crutches for at least 4 weeks, if not 6 based on your surgery. Bending and straightening the knee is unlimited, but do not flex your knee past 90 degrees until cleared by your therapist. Elevate knee above heart level as much as possible for one week. Avoid standing more than 5 minutes (consecutively) for the first week. No exercise involving the knee until cleared by the surgeon or physical therapist.  Avoid long distance travel for 4 weeks.   5. Medications:  - You have been provided a prescription for narcotic pain medicine. After surgery, take 1-2 narcotic tablets every 4 hours if needed for severe pain.  - A prescription for anti-nausea medication will be provided in case the narcotic medicine causes nausea - take 1 tablet every 6 hours only if nauseated.  - Take ibuprofen 800 mg every 8 hours with food to reduce post-operative knee swelling. DO NOT STOP IBUPROFEN POST-OP UNTIL INSTRUCTED TO DO SO at first post-op office visit (10-14 days after surgery).  - Take enteric coated aspirin 325 mg once daily for 4 weeks to prevent blood clots.  -Take tylenol 1000 every 8 hours for pain.  May stop tylenol 3  days after surgery or when you are having minimal pain.   If you are taking prescription medication for anxiety, depression, insomnia, muscle spasm, chronic pain, or for attention deficit disorder you are advised that you are at a higher risk of adverse effects with use of narcotics post-op, including narcotic addiction/dependence, depressed breathing, death. If you use non-prescribed substances: alcohol, marijuana, cocaine, heroin, methamphetamines, etc., you are at a higher risk of adverse effects with use of narcotics post-op, including narcotic addiction/dependence, depressed breathing, death. You are advised that taking > 50 morphine milligram equivalents (MME) of narcotic pain medication per day results in twice the risk of overdose or death. For your prescription provided: oxycodone 5 mg - taking more than 6 tablets per day. Be advised that we will prescribe narcotics short-term, for acute post-operative pain only - 1 week for minor operations such as knee arthroscopy for meniscus tear resection, and 3 weeks for major operations such as knee repair/reconstruction surgeries.   6. Bandages: The physical therapist should change the bandages at the first post-op appointment. If needed, the dressing supplies have been provided to you. You may shower after this with waterproof bandaids covering the incisions.    7. Physical Therapy: 2 times per week for the first 4 weeks, then 1-2 times per week from weeks 4-8 post-op. Therapy typically starts on post operative Day 3 or 4. You have been provided an order for physical therapy. The therapist will  provide home exercises.   8. Work: May return to full work when off of crutches. May do light duty/desk job in approximately 1-2 weeks when off of narcotics, pain is well-controlled, and swelling has decreased.   9. Post-Op Appointments: Your first post-op appointment will be with Dr. Allena Katz in approximately 2 weeks time.    If you find that they have not been  scheduled please call the Orthopaedic Appointment front desk at [phone].    AMBULATORY SURGERY  DISCHARGE INSTRUCTIONS   1) The drugs that you were given will stay in your system until tomorrow so for the next 24 hours you should not:  A) Drive an automobile B) Make any legal decisions C) Drink any alcoholic beverage   2) You may resume regular meals tomorrow.  Today it is better to start with liquids and gradually work up to solid foods.  You may eat anything you prefer, but it is better to start with liquids, then soup and crackers, and gradually work up to solid foods.   3) Please notify your doctor immediately if you have any unusual bleeding, trouble breathing, redness and pain at the surgery site, drainage, fever, or pain not relieved by medication.    4) Additional Instructions:        Please contact your physician with any problems or Same Day Surgery at [phone], Monday through Friday 6 am to 4 pm, or [place] at [facility] number at [phone].

## 2018-03-05 NOTE — Anesthesia Preprocedure Evaluation (Signed)
Anesthesia Evaluation  Patient identified by MRN, date of birth, ID band Patient awake    Reviewed: Allergy & Precautions, H&P , NPO status , Patient's Chart, lab work & pertinent test results  History of Anesthesia Complications (+) history of anesthetic complications  Airway Mallampati: II  TM Distance: >3 FB Neck ROM: full    Dental  (+) Chipped, Poor Dentition, Missing, Partial Upper   Pulmonary neg shortness of breath, asthma ,           Cardiovascular Exercise Tolerance: Good hypertension, (-) angina(-) Past MI      Neuro/Psych PSYCHIATRIC DISORDERS Anxiety negative neurological ROS     GI/Hepatic negative GI ROS, Neg liver ROS, GERD  Medicated and Controlled,  Endo/Other  negative endocrine ROS  Renal/GU      Musculoskeletal   Abdominal   Peds  Hematology negative hematology ROS (+)   Anesthesia Other Findings Past Medical History: No date: Alcohol abuse No date: Anxiety No date: Asthma     Comment:  EXERCISE INDUCED-NO INHALERS No date: Complication of anesthesia     Comment:  PT STATES HE GETS REALLY ANXIOUS WITH ANESTHESIA  AND               HAS TO BE GIVEN SOMETHING PRIOR TO GOING BACK TO OR No date: Elevated liver enzymes No date: GERD (gastroesophageal reflux disease)     Comment:  OCC No date: Hypertension  Past Surgical History: 2003, 2013: KNEE SURGERY; Bilateral     Comment:  ACL REPAIR  BMI    Body Mass Index:  22.81 kg/m      Reproductive/Obstetrics negative OB ROS                             Anesthesia Physical Anesthesia Plan  ASA: III  Anesthesia Plan: General   Post-op Pain Management:    Induction: Intravenous  PONV Risk Score and Plan: Ondansetron, Dexamethasone and Midazolam  Airway Management Planned: LMA  Additional Equipment:   Intra-op Plan:   Post-operative Plan: Extubation in OR  Informed Consent: I have reviewed the patients  History and Physical, chart, labs and discussed the procedure including the risks, benefits and alternatives for the proposed anesthesia with the patient or authorized representative who has indicated his/her understanding and acceptance.   Dental Advisory Given  Plan Discussed with: Anesthesiologist, CRNA and Surgeon  Anesthesia Plan Comments: (Patient consented for risks of anesthesia including but not limited to:  - adverse reactions to medications - damage to teeth, lips or other oral mucosa - sore throat or hoarseness - Damage to heart, brain, lungs or loss of life  Patient voiced understanding.)        Anesthesia Quick Evaluation

## 2018-03-05 NOTE — Anesthesia Post-op Follow-up Note (Signed)
Anesthesia QCDR form completed.        

## 2018-03-06 ENCOUNTER — Emergency Department
Admission: EM | Admit: 2018-03-06 | Discharge: 2018-03-06 | Discharge status: Against Medical Advice | Payer: BC Managed Care – PPO | Attending: Emergency Medicine | Admitting: Emergency Medicine

## 2018-03-06 ENCOUNTER — Other Ambulatory Visit: Payer: Self-pay

## 2018-03-06 ENCOUNTER — Encounter: Payer: Self-pay | Admitting: Emergency Medicine

## 2018-03-06 DIAGNOSIS — Z5321 Procedure and treatment not carried out due to patient leaving prior to being seen by health care provider: Secondary | ICD-10-CM | POA: Insufficient documentation

## 2018-03-06 DIAGNOSIS — T7840XA Allergy, unspecified, initial encounter: Secondary | ICD-10-CM | POA: Insufficient documentation

## 2018-03-06 NOTE — ED Triage Notes (Signed)
Patient ambulatory to triage with steady gait, without difficulty or distress noted; pt reports used pre op clens yesterday, now with generalized itching; denies rash

## 2019-01-09 DIAGNOSIS — E538 Deficiency of other specified B group vitamins: Secondary | ICD-10-CM | POA: Insufficient documentation

## 2019-01-09 HISTORY — DX: Deficiency of other specified B group vitamins: E53.8

## 2019-01-11 ENCOUNTER — Other Ambulatory Visit: Payer: Self-pay | Admitting: Gerontology

## 2019-01-11 DIAGNOSIS — F1010 Alcohol abuse, uncomplicated: Secondary | ICD-10-CM

## 2019-01-11 DIAGNOSIS — R748 Abnormal levels of other serum enzymes: Secondary | ICD-10-CM

## 2019-01-14 ENCOUNTER — Other Ambulatory Visit: Payer: Self-pay | Admitting: Gerontology

## 2019-01-14 DIAGNOSIS — F1010 Alcohol abuse, uncomplicated: Secondary | ICD-10-CM

## 2019-01-14 DIAGNOSIS — R748 Abnormal levels of other serum enzymes: Secondary | ICD-10-CM

## 2019-02-18 ENCOUNTER — Ambulatory Visit
Admission: RE | Admit: 2019-02-18 | Discharge: 2019-02-18 | Disposition: A | Discharge status: Home / Self Care | Payer: BC Managed Care – PPO | Source: Ambulatory Visit | Attending: Neurology | Admitting: Neurology

## 2019-02-18 ENCOUNTER — Other Ambulatory Visit: Payer: Self-pay

## 2019-02-18 DIAGNOSIS — R748 Abnormal levels of other serum enzymes: Secondary | ICD-10-CM | POA: Diagnosis present

## 2019-02-18 DIAGNOSIS — F1010 Alcohol abuse, uncomplicated: Secondary | ICD-10-CM | POA: Diagnosis present

## 2020-11-20 DIAGNOSIS — N529 Male erectile dysfunction, unspecified: Secondary | ICD-10-CM | POA: Insufficient documentation

## 2021-02-15 ENCOUNTER — Other Ambulatory Visit: Payer: Self-pay

## 2021-02-15 ENCOUNTER — Encounter: Payer: Self-pay | Admitting: Emergency Medicine

## 2021-02-15 ENCOUNTER — Emergency Department: Payer: BC Managed Care – PPO

## 2021-02-15 ENCOUNTER — Emergency Department
Admission: EM | Admit: 2021-02-15 | Discharge: 2021-02-15 | Disposition: A | Discharge status: Home / Self Care | Payer: BC Managed Care – PPO | Attending: Emergency Medicine | Admitting: Emergency Medicine

## 2021-02-15 DIAGNOSIS — J45909 Unspecified asthma, uncomplicated: Secondary | ICD-10-CM | POA: Insufficient documentation

## 2021-02-15 DIAGNOSIS — R0781 Pleurodynia: Secondary | ICD-10-CM | POA: Diagnosis not present

## 2021-02-15 DIAGNOSIS — I10 Essential (primary) hypertension: Secondary | ICD-10-CM | POA: Insufficient documentation

## 2021-02-15 DIAGNOSIS — Y92410 Unspecified street and highway as the place of occurrence of the external cause: Secondary | ICD-10-CM | POA: Insufficient documentation

## 2021-02-15 DIAGNOSIS — Z79899 Other long term (current) drug therapy: Secondary | ICD-10-CM | POA: Diagnosis not present

## 2021-02-15 MED ORDER — MELOXICAM 7.5 MG PO TABS
15.0000 mg | ORAL_TABLET | Freq: Once | ORAL | Status: AC
  Administered 2021-02-15: 15 mg via ORAL
  Filled 2021-02-15: qty 2

## 2021-02-15 MED ORDER — LIDOCAINE 5 % EX PTCH
1.0000 | MEDICATED_PATCH | CUTANEOUS | Status: DC
  Administered 2021-02-15: 1 via TRANSDERMAL
  Filled 2021-02-15: qty 1

## 2021-02-15 MED ORDER — ACETAMINOPHEN 325 MG PO TABS
650.0000 mg | ORAL_TABLET | Freq: Once | ORAL | Status: AC
  Administered 2021-02-15: 650 mg via ORAL
  Filled 2021-02-15: qty 2

## 2021-02-15 MED ORDER — MELOXICAM 15 MG PO TABS: 15.0000 mg | ORAL_TABLET | Freq: Every day | ORAL | 0 refills | Status: AC

## 2021-02-15 NOTE — Discharge Instructions (Addendum)
Please take the Mobic  as prescribed.  You may also use Tylenol, up to 1000 mg 4 times daily for pain.  Please also apply extra strength Salonpas which are lidocaine patches that can help with pain.  These are available over-the-counter.  Return to the emergency department if you experience any worsening.  Otherwise, follow-up with primary care.
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
## 2021-07-27 ENCOUNTER — Other Ambulatory Visit: Payer: Self-pay | Admitting: Urology

## 2021-07-27 ENCOUNTER — Other Ambulatory Visit (HOSPITAL_COMMUNITY): Payer: Self-pay | Admitting: Urology

## 2021-07-27 DIAGNOSIS — R972 Elevated prostate specific antigen [PSA]: Secondary | ICD-10-CM

## 2021-08-04 ENCOUNTER — Ambulatory Visit
Admission: RE | Admit: 2021-08-04 | Discharge: 2021-08-04 | Disposition: A | Discharge status: Home / Self Care | Payer: BC Managed Care – PPO | Source: Ambulatory Visit | Attending: Urology | Admitting: Urology

## 2021-08-04 ENCOUNTER — Other Ambulatory Visit: Payer: Self-pay

## 2021-08-04 DIAGNOSIS — R972 Elevated prostate specific antigen [PSA]: Secondary | ICD-10-CM | POA: Diagnosis not present

## 2021-08-04 MED ORDER — GADOBUTROL 1 MMOL/ML IV SOLN
6.0000 mL | Freq: Once | INTRAVENOUS | Status: AC | PRN
  Administered 2021-08-04: 6 mL via INTRAVENOUS

## 2021-08-23 ENCOUNTER — Other Ambulatory Visit: Payer: Self-pay

## 2021-08-23 ENCOUNTER — Encounter
Admission: RE | Admit: 2021-08-23 | Discharge: 2021-08-23 | Disposition: A | Discharge status: Home / Self Care | Payer: BC Managed Care – PPO | Source: Ambulatory Visit | Attending: Urology | Admitting: Urology

## 2021-08-23 DIAGNOSIS — I10 Essential (primary) hypertension: Secondary | ICD-10-CM | POA: Diagnosis not present

## 2021-08-23 DIAGNOSIS — Z01810 Encounter for preprocedural cardiovascular examination: Secondary | ICD-10-CM | POA: Diagnosis not present

## 2021-08-23 DIAGNOSIS — Z01812 Encounter for preprocedural laboratory examination: Secondary | ICD-10-CM | POA: Insufficient documentation

## 2021-08-23 HISTORY — DX: Anemia, unspecified: D64.9

## 2021-08-23 HISTORY — DX: Unspecified osteoarthritis, unspecified site: M19.90

## 2021-08-23 NOTE — Patient Instructions (Signed)
Your procedure is scheduled on:09-02-21 Thursday Report to the Registration Desk on the 1st floor of the Medical Mall.Then proceed to the 2nd floor Surgery Desk  To find out your arrival time, please call [phone] between 1PM - 3PM on:09-01-21 Wednesday  REMEMBER: Instructions that are not followed completely may result in serious medical risk, up to and including death; or upon the discretion of your surgeon and anesthesiologist your surgery may need to be rescheduled.  Do not eat food after midnight the night before surgery.  No gum chewing, lozengers or hard candies.  You may however, drink CLEAR liquids up to 2 hours before you are scheduled to arrive for your surgery. Do not drink anything within 2 hours of your scheduled arrival time.  Clear liquids include: - water  - apple juice without pulp - gatorade (not RED, PURPLE, OR BLUE) - black coffee or tea (Do NOT add milk or creamers to the coffee or tea) Do NOT drink anything that is not on this list.  TAKE THESE MEDICATIONS THE MORNING OF SURGERY WITH A SIP OF WATER: -amLODipine (NORVASC) 10 MG tablet -Nexium (Esomeprazole Magnesium 20 MG )-take one the night before and one on the morning of surgery - helps to prevent nausea after surgery.)  Use your albuterol (PROVENTIL HFA;VENTOLIN HFA) 108 (90 Base) MCG/ACT inhaler the day of surgery and bring inhaler to the hospital  One week prior to surgery: Stop Anti-inflammatories (NSAIDS) such as Advil, Aleve, Ibuprofen, Motrin, Naproxen, Naprosyn and Aspirin based products such as Excedrin, Goodys Powder, BC Powder.You may however, continue to take Tylenol if needed for pain up until the day of surgery.  Stop ANY OVER THE COUNTER supplements/Vitamins 7 days prior to surgery (VITAMIN D PO, Multiple Vitamins-Minerals (MULTIVITAMIN WITH MINERALS) tablet, Cyanocobalamin (B-12 PO)  No Alcohol for 24 hours before or after surgery.  No Smoking including e-cigarettes for 24 hours prior to  surgery.  No chewable tobacco products for at least 6 hours prior to surgery.  No nicotine patches on the day of surgery.  Do not use any "recreational" drugs for at least a week prior to your surgery.  Please be advised that the combination of cocaine and anesthesia may have negative outcomes, up to and including death. If you test positive for cocaine, your surgery will be cancelled.  On the morning of surgery brush your teeth with toothpaste and water, you may rinse your mouth with mouthwash if you wish. Do not swallow any toothpaste or mouthwash.  Do not wear jewelry, make-up, hairpins, clips or nail polish.  Do not wear lotions, powders, or perfumes.   Do not shave body from the neck down 48 hours prior to surgery just in case you cut yourself which could leave a site for infection.  Also, freshly shaved skin may become irritated if using the CHG soap.  Contact lenses, hearing aids and dentures may not be worn into surgery.  Do not bring valuables to the hospital. [facility] is not responsible for any missing/lost belongings or valuables.   Do Fleet Enema at home the morning of surgery 1 hour prior to arrival time-May repeat until clear  Notify your doctor if there is any change in your medical condition (cold, fever, infection).  Wear comfortable clothing (specific to your surgery type) to the hospital.  After surgery, you can help prevent lung complications by doing breathing exercises.  Take deep breaths and cough every 1-2 hours. Your doctor may order a device called an Facilities manager to  help you take deep breaths. When coughing or sneezing, hold a pillow firmly against your incision with both hands. This is called "splinting." Doing this helps protect your incision. It also decreases belly discomfort.  If you are being admitted to the hospital overnight, leave your suitcase in the car. After surgery it may be brought to your room.  If you are being discharged the  day of surgery, you will not be allowed to drive home. You will need a responsible adult (18 years or older) to drive you home and stay with you that night.   If you are taking public transportation, you will need to have a responsible adult (18 years or older) with you. Please confirm with your physician that it is acceptable to use public transportation.   Please call the Pre-admissions Testing Dept. at [phone] if you have any questions about these instructions.  Surgery Visitation Policy:  Patients undergoing a surgery or procedure may have one family member or support person with them as long as that person is not COVID-19 positive or experiencing its symptoms.  That person may remain in the waiting area during the procedure and may rotate out with other people.  Inpatient Visitation:    Visiting hours are 7 a.m. to 8 p.m. Up to two visitors ages 16+ are allowed at one time in a patient room. The visitors may rotate out with other people during the day. Visitors must check out when they leave, or other visitors will not be allowed. One designated support person may remain overnight. The visitor must pass COVID-19 screenings, use hand sanitizer when entering and exiting the patient's room and wear a mask at all times, including in the patient's room. Patients must also wear a mask when staff or their visitor are in the room. Masking is required regardless of vaccination status.

## 2021-08-27 NOTE — H&P (Signed)
NAME: Russell Hansen, MAYBERRY MEDICAL RECORD NO: [id number] ACCOUNT NO: [id number] DATE OF BIRTH: [birth year] PHYSICIAN: Suszanne Conners. Evelene Croon, MD  History and Physical   DATE OF ADMISSION: 09/02/2021  Same day surgery is scheduled for 09/02/2021.  CHIEF COMPLAINT:  Elevated PSA and abnormal prostate gland MRI scan.  HISTORY OF PRESENT ILLNESS:  The patient is a 40 year old African American male, found to have an elevated PSA of 5.0 ng/mL on 07/26/2021.  He was evaluated with prostate gland MRI scan on 10/12 revealing a 21.76 mL prostate with 2 PI-RADS category  lesions involving the peripheral zone.  He comes in now for UroNav fusion prostate biopsy.  PAST MEDICAL HISTORY:  ALLERGIES:  THE PATIENT WAS ALLERGIC TO HYDROCODONE.  MEDICATIONS:  Included Jatenzo, sildenafil, amlodipine, vitamin D, Ventolin inhaler, vitamin C, albuterol inhaler.  PAST SURGICAL HISTORY:  Repair of a right ACL injury in 2013 and again in 2019.  PAST AND CURRENT MEDICAL CONDITIONS: 1.  Hypertension. 2.  Asthma. 3.  Depression with anxiety. 4.  History of elevated liver function studies. 5.  Chronic right knee pain. 6.  Hypogonadism. 7.  Erectile dysfunction.  SOCIAL HISTORY:  The patient denied tobacco use.  Consumes 15-20 alcoholic beverages per week.  FAMILY HISTORY:  Parents have hypertension and hyperlipidemia.  There is no family history of prostate cancer.  REVIEW OF SYSTEMS:  The patient denied chest pain, diabetes, stroke, or heart disease.  PHYSICAL EXAMINATION:   VITAL SIGNS:  Height 5 feet 8 inches, weight 144 pounds. GENERAL:  Well-nourished African American male in no acute distress. HEENT:  Sclerae were clear.  Pupils were equally round, reactive to light and accommodation.  Extraocular movements are intact. NECK:  No palpable masses or tenderness.  No audible carotid bruits. LYMPHATIC:  No palpable cervical or inguinal adenopathy. PULMONARY:  Lungs are clear to  auscultation. CARDIOVASCULAR:  Regular rhythm and rate without audible murmurs. ABDOMEN:  Soft, nontender.  No CVA tenderness. GENITOURINARY:  Circumcised.  Testes atrophic, approximately 12 mL in size each. RECTAL:  20 gram, smooth, nontender prostate. NEUROMUSCULAR:  Alert and oriented x3.  IMPRESSION:    1.  Elevated PSA. 2.  Abnormal prostate gland MRI scan with 2 PI-RADS category 3 lesions of the peripheral zone.  PLAN:  UroNav fusion biopsy of the prostate.   [facility] D: 08/26/2021 10:15:40 am T: 08/26/2021 10:48:00 am  JOB: [id number]/ [id number]

## 2021-09-02 ENCOUNTER — Encounter: Payer: Self-pay | Admitting: Urology

## 2021-09-02 ENCOUNTER — Other Ambulatory Visit: Payer: Self-pay

## 2021-09-02 ENCOUNTER — Encounter
Admission: RE | Disposition: A | Discharge status: Home / Self Care | Payer: Self-pay | Source: Ambulatory Visit | Attending: Urology

## 2021-09-02 ENCOUNTER — Ambulatory Visit: Payer: BC Managed Care – PPO | Admitting: Certified Registered"

## 2021-09-02 ENCOUNTER — Ambulatory Visit
Admission: RE | Admit: 2021-09-02 | Discharge: 2021-09-02 | Disposition: A | Discharge status: Home / Self Care | Payer: BC Managed Care – PPO | Source: Ambulatory Visit | Attending: Urology | Admitting: Urology

## 2021-09-02 ENCOUNTER — Ambulatory Visit: Payer: BC Managed Care – PPO | Admitting: Urgent Care

## 2021-09-02 DIAGNOSIS — R9389 Abnormal findings on diagnostic imaging of other specified body structures: Secondary | ICD-10-CM | POA: Insufficient documentation

## 2021-09-02 DIAGNOSIS — I10 Essential (primary) hypertension: Secondary | ICD-10-CM | POA: Diagnosis not present

## 2021-09-02 DIAGNOSIS — R972 Elevated prostate specific antigen [PSA]: Secondary | ICD-10-CM | POA: Insufficient documentation

## 2021-09-02 DIAGNOSIS — J45909 Unspecified asthma, uncomplicated: Secondary | ICD-10-CM | POA: Insufficient documentation

## 2021-09-02 HISTORY — PX: PROSTATE BIOPSY: SHX241

## 2021-09-02 SURGERY — BIOPSY, PROSTATE
Anesthesia: General

## 2021-09-02 MED ORDER — DEXAMETHASONE SODIUM PHOSPHATE 10 MG/ML IJ SOLN
INTRAMUSCULAR | Status: DC | PRN
  Administered 2021-09-02: 10 mg via INTRAVENOUS

## 2021-09-02 MED ORDER — GENTAMICIN SULFATE 40 MG/ML IJ SOLN
80.0000 mg | Freq: Once | INTRAVENOUS | Status: DC
  Filled 2021-09-02: qty 2

## 2021-09-02 MED ORDER — LACTATED RINGERS IV SOLN: INTRAVENOUS | Status: DC

## 2021-09-02 MED ORDER — OXYCODONE HCL 5 MG PO TABS: 5.0000 mg | ORAL_TABLET | Freq: Once | ORAL | Status: DC | PRN

## 2021-09-02 MED ORDER — ACETAMINOPHEN 10 MG/ML IV SOLN: 1000.0000 mg | Freq: Once | INTRAVENOUS | Status: DC | PRN

## 2021-09-02 MED ORDER — ONDANSETRON HCL 4 MG/2ML IJ SOLN
INTRAMUSCULAR | Status: DC | PRN
  Administered 2021-09-02: 4 mg via INTRAVENOUS

## 2021-09-02 MED ORDER — ONDANSETRON HCL 4 MG/2ML IJ SOLN: 4.0000 mg | Freq: Once | INTRAMUSCULAR | Status: DC | PRN

## 2021-09-02 MED ORDER — FENTANYL CITRATE (PF) 100 MCG/2ML IJ SOLN: 25.0000 ug | INTRAMUSCULAR | Status: DC | PRN

## 2021-09-02 MED ORDER — CEFAZOLIN SODIUM-DEXTROSE 2-4 GM/100ML-% IV SOLN
INTRAVENOUS | Status: AC
  Filled 2021-09-02: qty 100

## 2021-09-02 MED ORDER — ORAL CARE MOUTH RINSE: 15.0000 mL | Freq: Once | OROMUCOSAL | Status: AC

## 2021-09-02 MED ORDER — FENTANYL CITRATE (PF) 100 MCG/2ML IJ SOLN
INTRAMUSCULAR | Status: DC | PRN
  Administered 2021-09-02 (×2): 50 ug via INTRAVENOUS

## 2021-09-02 MED ORDER — DEXMEDETOMIDINE (PRECEDEX) IN NS 20 MCG/5ML (4 MCG/ML) IV SYRINGE
PREFILLED_SYRINGE | INTRAVENOUS | Status: DC | PRN
  Administered 2021-09-02: 8 ug via INTRAVENOUS
  Administered 2021-09-02: 12 ug via INTRAVENOUS

## 2021-09-02 MED ORDER — ACETAMINOPHEN 10 MG/ML IV SOLN
INTRAVENOUS | Status: AC
  Filled 2021-09-02: qty 100

## 2021-09-02 MED ORDER — CHLORHEXIDINE GLUCONATE 0.12 % MT SOLN: 15.0000 mL | Freq: Once | OROMUCOSAL | Status: AC

## 2021-09-02 MED ORDER — CEFAZOLIN SODIUM-DEXTROSE 1-4 GM/50ML-% IV SOLN
1.0000 g | Freq: Once | INTRAVENOUS | Status: AC
  Administered 2021-09-02: 1 g via INTRAVENOUS

## 2021-09-02 MED ORDER — MIDAZOLAM HCL 2 MG/2ML IJ SOLN
INTRAMUSCULAR | Status: DC | PRN
  Administered 2021-09-02: 2 mg via INTRAVENOUS

## 2021-09-02 MED ORDER — GENTAMICIN IN SALINE 1.6-0.9 MG/ML-% IV SOLN
80.0000 mg | INTRAVENOUS | Status: AC
  Administered 2021-09-02: 80 mg via INTRAVENOUS
  Filled 2021-09-02: qty 50

## 2021-09-02 MED ORDER — ACETAMINOPHEN 10 MG/ML IV SOLN
INTRAVENOUS | Status: DC | PRN
  Administered 2021-09-02: 1000 mg via INTRAVENOUS

## 2021-09-02 MED ORDER — LACTATED RINGERS IV SOLN: INTRAVENOUS | Status: DC | PRN

## 2021-09-02 MED ORDER — PROPOFOL 500 MG/50ML IV EMUL
INTRAVENOUS | Status: AC
  Filled 2021-09-02: qty 50

## 2021-09-02 MED ORDER — FENTANYL CITRATE (PF) 100 MCG/2ML IJ SOLN
INTRAMUSCULAR | Status: AC
  Filled 2021-09-02: qty 2

## 2021-09-02 MED ORDER — GLYCOPYRROLATE 0.2 MG/ML IJ SOLN
INTRAMUSCULAR | Status: DC | PRN
  Administered 2021-09-02: .2 mg via INTRAVENOUS

## 2021-09-02 MED ORDER — CEFAZOLIN SODIUM-DEXTROSE 1-4 GM/50ML-% IV SOLN
INTRAVENOUS | Status: AC
  Filled 2021-09-02: qty 50

## 2021-09-02 MED ORDER — LIDOCAINE HCL (CARDIAC) PF 100 MG/5ML IV SOSY
PREFILLED_SYRINGE | INTRAVENOUS | Status: DC | PRN
  Administered 2021-09-02: 100 mg via INTRAVENOUS

## 2021-09-02 MED ORDER — MIDAZOLAM HCL 2 MG/2ML IJ SOLN
INTRAMUSCULAR | Status: AC
  Filled 2021-09-02: qty 2

## 2021-09-02 MED ORDER — LEVOFLOXACIN 500 MG PO TABS: 500.0000 mg | ORAL_TABLET | Freq: Every day | ORAL | 0 refills | Status: DC

## 2021-09-02 MED ORDER — PROPOFOL 10 MG/ML IV BOLUS
INTRAVENOUS | Status: DC | PRN
  Administered 2021-09-02: 200 mg via INTRAVENOUS

## 2021-09-02 MED ORDER — FLEET ENEMA 7-19 GM/118ML RE ENEM: 1.0000 | ENEMA | Freq: Once | RECTAL | Status: DC

## 2021-09-02 MED ORDER — OXYCODONE HCL 5 MG/5ML PO SOLN: 5.0000 mg | Freq: Once | ORAL | Status: DC | PRN

## 2021-09-02 MED ORDER — CHLORHEXIDINE GLUCONATE 0.12 % MT SOLN
OROMUCOSAL | Status: AC
  Administered 2021-09-02: 15 mL via OROMUCOSAL
  Filled 2021-09-02: qty 15

## 2021-09-02 MED ORDER — SUGAMMADEX SODIUM 500 MG/5ML IV SOLN
INTRAVENOUS | Status: DC | PRN
  Administered 2021-09-02: 400 mg via INTRAVENOUS

## 2021-09-02 SURGICAL SUPPLY — 17 items
COVER MAYO STAND REUSABLE (DRAPES) ×2 IMPLANT
COVER TRANSDUCER ULTRASOUND (MISCELLANEOUS) ×1 IMPLANT
DRSG TELFA 3X8 NADH (GAUZE/BANDAGES/DRESSINGS) ×2 IMPLANT
GLOVE SURG ENC MOIS LTX SZ7 (GLOVE) ×4 IMPLANT
INST BIOPSY MAXCORE 18GX25 (NEEDLE) ×2 IMPLANT
MANIFOLD NEPTUNE II (INSTRUMENTS) ×1 IMPLANT
NDL BIO TRUPATH DISP 18GX25 (MISCELLANEOUS) IMPLANT
NEEDLE BIO TRUPATH DISP 18GX25 (MISCELLANEOUS) ×2 IMPLANT
PAD DRESSING TELFA 3X8 NADH (GAUZE/BANDAGES/DRESSINGS) IMPLANT
PROBE URONAV BK 8808E 8818 HLD (MISCELLANEOUS) IMPLANT
STRAP SAFETY 5IN WIDE (MISCELLANEOUS) ×2 IMPLANT
SURGILUBE 2OZ TUBE FLIPTOP (MISCELLANEOUS) ×2 IMPLANT
TOWEL OR 17X26 4PK STRL BLUE (TOWEL DISPOSABLE) ×2 IMPLANT
URONAV BK 8808E 8818 PROBE HLD (MISCELLANEOUS) ×2
URONAV MRI FUSION TWO PATIENTS (MISCELLANEOUS) ×1 IMPLANT
URONAV ULTRASOUND (MISCELLANEOUS) ×1 IMPLANT
WATER STERILE IRR 500ML POUR (IV SOLUTION) ×2 IMPLANT

## 2021-09-02 NOTE — Discharge Instructions (Addendum)
Transrectal Ultrasound-Guided Prostate Biopsy, Care After What can I expect after the procedure? After the procedure, it is common to have: Pain and discomfort near your butt (rectum), especially while sitting. Pink-colored pee (urine). This is due to small amounts of blood in your pee. A burning feeling while peeing. Blood in your poop (stool). Bleeding from your butt. Blood in your semen. Follow these instructions at home: Medicines Take over-the-counter and prescription medicines only as told by your doctor. If you were given a sedative during your procedure, do not drive or use machines until your doctor says that it is safe. A sedative is a medicine that helps you relax. If you were prescribed an antibiotic medicine, take it as told by your doctor. Do not stop taking it even if you start to feel better. Activity   AMBULATORY SURGERY  DISCHARGE INSTRUCTIONS   The drugs that you were given will stay in your system until tomorrow so for the next 24 hours you should not:  Drive an automobile Make any legal decisions Drink any alcoholic beverage   You may resume regular meals tomorrow.  Today it is better to start with liquids and gradually work up to solid foods.  You may eat anything you prefer, but it is better to start with liquids, then soup and crackers, and gradually work up to solid foods.   Please notify your doctor immediately if you have any unusual bleeding, trouble breathing, redness and pain at the surgery site, drainage, fever, or pain not relieved by medication.    Your post-operative visit with Dr.                                       is: Date:                        Time:    Please call to schedule your post-operative visit.  Additional Instructions:  

## 2021-09-02 NOTE — Anesthesia Procedure Notes (Signed)
Procedure Name: Intubation Date/Time: 09/02/2021 7:51 AM Performed by: Mohammed Kindle, CRNA Pre-anesthesia Checklist: Patient identified, Emergency Drugs available, Suction available and Patient being monitored Patient Re-evaluated:Patient Re-evaluated prior to induction Oxygen Delivery Method: Circle system utilized Preoxygenation: Pre-oxygenation with 100% oxygen Induction Type: IV induction Ventilation: Mask ventilation without difficulty Laryngoscope Size: McGraph and 3 Grade View: Grade I Tube type: Oral Tube size: 7.0 mm Number of attempts: 1 Airway Equipment and Method: Stylet and Oral airway Placement Confirmation: ETT inserted through vocal cords under direct vision, positive ETCO2, breath sounds checked- equal and bilateral and CO2 detector Secured at: 21 cm Tube secured with: Tape Dental Injury: Teeth and Oropharynx as per pre-operative assessment

## 2021-09-02 NOTE — Transfer of Care (Signed)
Immediate Anesthesia Transfer of Care Note  Patient: Russell Hansen  Procedure(s) Performed: PROSTATE BIOPSY URONAV  Patient Location: PACU  Anesthesia Type:General  Level of Consciousness: awake, drowsy and patient cooperative  Airway & Oxygen Therapy: Patient Spontanous Breathing and Patient connected to face mask oxygen  Post-op Assessment: Report given to RN and Post -op Vital signs reviewed and stable  Post vital signs: Reviewed and stable  Last Vitals:  Vitals Value Taken Time  BP 107/79 09/02/21 0816  Temp 36.6 C 09/02/21 0816  Pulse 71 09/02/21 0817  Resp 10 09/02/21 0817  SpO2 99 % 09/02/21 0817  Vitals shown include unvalidated device data.  Last Pain:  Vitals:   09/02/21 0816  TempSrc:   PainSc: Asleep      Patients Stated Pain Goal: 0 (09/02/21 1884)  Complications: No notable events documented.

## 2021-09-02 NOTE — Op Note (Signed)
Preoperative diagnosis: 1.  Elevated PSA (R97.2))                                           2.  Abnormal prostate gland MRI scan (D40.0)  Postoperative diagnosis: Same  Procedure: Uronav fusion transrectal ultrasound prostate biopsy (CPT 55700, [phone])  Surgeon: Suszanne Conners. Evelene Croon MD  Anesthesia: General  Indications:See the history and physical. After informed consent the above procedure(s) were requested     Technique and findings: After adequate general anesthesia been obtained the patient was placed into left lateral decubitus position and DRE was performed.  The rectum was noted to be clear.  The ultrasound probe was then placed and ultrasound images acquired.  The ultrasound images were then fused with the MRI images.  Region of interest #1 on the left side was identified and 4 core biopsies taken.  Region of interest #2 on the right side was identified and 4 core biopsies taken here.  At this point standard 12 core systematic biopsies were performed.  The ultrasound probe was then removed.  Blood loss was minimal.  The procedure was then terminated and patient transferred to the recovery room in stable condition.

## 2021-09-02 NOTE — Anesthesia Preprocedure Evaluation (Addendum)
Anesthesia Evaluation  Patient identified by MRN, date of birth, ID band Patient awake    Reviewed: Allergy & Precautions, NPO status , Patient's Chart, lab work & pertinent test results  History of Anesthesia Complications Negative for: history of anesthetic complications  Airway Mallampati: IV   Neck ROM: Full    Dental   Implant; missing molar x1:   Pulmonary asthma ,    Pulmonary exam normal breath sounds clear to auscultation       Cardiovascular hypertension, Normal cardiovascular exam Rhythm:Regular Rate:Normal  ECG 08/23/21:  Normal sinus rhythm Minimal voltage criteria for LVH, may be normal variant ( Sokolow-Lyon )   Neuro/Psych PSYCHIATRIC DISORDERS Anxiety Alcohol use disorder, approx 6 beers per day, last intake 09/01/21    GI/Hepatic GERD  ,  Endo/Other  negative endocrine ROS  Renal/GU negative Renal ROS     Musculoskeletal  (+) Arthritis ,   Abdominal   Peds  Hematology negative hematology ROS (+)   Anesthesia Other Findings   Reproductive/Obstetrics                            Anesthesia Physical Anesthesia Plan  ASA: 2  Anesthesia Plan: General   Post-op Pain Management:    Induction: Intravenous  PONV Risk Score and Plan: 1 and Ondansetron, Dexamethasone and Treatment may vary due to age or medical condition  Airway Management Planned: LMA  Additional Equipment:   Intra-op Plan:   Post-operative Plan: Extubation in OR  Informed Consent: I have reviewed the patients History and Physical, chart, labs and discussed the procedure including the risks, benefits and alternatives for the proposed anesthesia with the patient or authorized representative who has indicated his/her understanding and acceptance.     Dental advisory given  Plan Discussed with: CRNA  Anesthesia Plan Comments: (Patient consented for risks of anesthesia including but not limited to:   - adverse reactions to medications - damage to eyes, teeth, lips or other oral mucosa - nerve damage due to positioning  - sore throat or hoarseness - damage to heart, brain, nerves, lungs, other parts of body or loss of life  Informed patient about role of CRNA in peri- and intra-operative care.  Patient voiced understanding.)        Anesthesia Quick Evaluation

## 2021-09-02 NOTE — Anesthesia Postprocedure Evaluation (Signed)
Anesthesia Post Note  Patient: Russell Hansen  Procedure(s) Performed: PROSTATE BIOPSY URONAV  Patient location during evaluation: PACU Anesthesia Type: General Level of consciousness: awake and alert, oriented and patient cooperative Pain management: pain level controlled Vital Signs Assessment: post-procedure vital signs reviewed and stable Respiratory status: spontaneous breathing, nonlabored ventilation and respiratory function stable Cardiovascular status: blood pressure returned to baseline and stable Postop Assessment: adequate PO intake Anesthetic complications: no   No notable events documented.   Last Vitals:  Vitals:   09/02/21 0837 09/02/21 0848  BP: 116/88 118/80  Pulse: 99 72  Resp: 16 18  Temp: (!) 36.3 C (!) 36.2 C  SpO2: 100% 97%    Last Pain:  Vitals:   09/02/21 0848  TempSrc: Temporal  PainSc: 0-No pain                 Reed Breech

## 2021-09-02 NOTE — H&P (Signed)
Date of Initial H&P: 08/26/21  History reviewed, patient examined, no change in status, stable for surgery. 

## 2021-09-06 LAB — SURGICAL PATHOLOGY

## 2021-09-09 ENCOUNTER — Encounter: Payer: Self-pay | Admitting: Urology

## 2021-10-22 ENCOUNTER — Other Ambulatory Visit: Payer: Self-pay

## 2021-10-22 ENCOUNTER — Emergency Department
Admission: EM | Admit: 2021-10-22 | Discharge: 2021-10-22 | Disposition: A | Discharge status: Home / Self Care | Payer: BC Managed Care – PPO | Attending: Emergency Medicine | Admitting: Emergency Medicine

## 2021-10-22 ENCOUNTER — Encounter: Payer: Self-pay | Admitting: Emergency Medicine

## 2021-10-22 DIAGNOSIS — I10 Essential (primary) hypertension: Secondary | ICD-10-CM | POA: Insufficient documentation

## 2021-10-22 DIAGNOSIS — Z79899 Other long term (current) drug therapy: Secondary | ICD-10-CM | POA: Insufficient documentation

## 2021-10-22 DIAGNOSIS — J45909 Unspecified asthma, uncomplicated: Secondary | ICD-10-CM | POA: Insufficient documentation

## 2021-10-22 DIAGNOSIS — F17290 Nicotine dependence, other tobacco product, uncomplicated: Secondary | ICD-10-CM | POA: Diagnosis not present

## 2021-10-22 DIAGNOSIS — R252 Cramp and spasm: Secondary | ICD-10-CM | POA: Diagnosis not present

## 2021-10-22 DIAGNOSIS — R112 Nausea with vomiting, unspecified: Secondary | ICD-10-CM | POA: Diagnosis present

## 2021-10-22 LAB — MAGNESIUM: Magnesium: 1.5 mg/dL — ABNORMAL LOW (ref 1.7–2.4)

## 2021-10-22 LAB — COMPREHENSIVE METABOLIC PANEL
ALT: 88 U/L — ABNORMAL HIGH (ref 0–44)
AST: 217 U/L — ABNORMAL HIGH (ref 15–41)
Albumin: 4 g/dL (ref 3.5–5.0)
Alkaline Phosphatase: 126 U/L (ref 38–126)
Anion gap: 10 (ref 5–15)
BUN: 8 mg/dL (ref 6–20)
CO2: 24 mmol/L (ref 22–32)
Calcium: 9.1 mg/dL (ref 8.9–10.3)
Chloride: 97 mmol/L — ABNORMAL LOW (ref 98–111)
Creatinine, Ser: 0.83 mg/dL (ref 0.61–1.24)
GFR, Estimated: >60 mL/min (ref >60)
Glucose, Bld: 109 mg/dL — ABNORMAL HIGH (ref 70–99)
Potassium: 3.3 mmol/L — ABNORMAL LOW (ref 3.5–5.1)
Sodium: 131 mmol/L — ABNORMAL LOW (ref 135–145)
Total Bilirubin: 0.9 mg/dL (ref 0.3–1.2)
Total Protein: 6.7 g/dL (ref 6.5–8.1)

## 2021-10-22 LAB — URINE DRUG SCREEN, QUALITATIVE (ARMC ONLY)
Amphetamines, Ur Screen: NOT DETECTED
Barbiturates, Ur Screen: NOT DETECTED
Benzodiazepine, Ur Scrn: NOT DETECTED
Cannabinoid 50 Ng, Ur ~~LOC~~: POSITIVE — AB
Cocaine Metabolite,Ur ~~LOC~~: NOT DETECTED
MDMA (Ecstasy)Ur Screen: NOT DETECTED
Methadone Scn, Ur: NOT DETECTED
Opiate, Ur Screen: NOT DETECTED
Phencyclidine (PCP) Ur S: NOT DETECTED
Tricyclic, Ur Screen: NOT DETECTED

## 2021-10-22 LAB — CBC
HCT: 37.2 % — ABNORMAL LOW (ref 39.0–52.0)
Hemoglobin: 13.1 g/dL (ref 13.0–17.0)
MCH: 35.9 pg — ABNORMAL HIGH (ref 26.0–34.0)
MCHC: 35.2 g/dL (ref 30.0–36.0)
MCV: 101.9 fL — ABNORMAL HIGH (ref 80.0–100.0)
Platelets: 211 10*3/uL (ref 150–400)
RBC: 3.65 MIL/uL — ABNORMAL LOW (ref 4.22–5.81)
RDW: 11.4 % — ABNORMAL LOW (ref 11.5–15.5)
WBC: 6.2 10*3/uL (ref 4.0–10.5)
nRBC: 0 % (ref 0.0–0.2)

## 2021-10-22 LAB — ETHANOL: Alcohol, Ethyl (B): 86 mg/dL — ABNORMAL HIGH (ref <10)

## 2021-10-22 MED ORDER — MAGNESIUM OXIDE 400 MG PO CAPS: 400.0000 mg | ORAL_CAPSULE | Freq: Two times a day (BID) | ORAL | 0 refills | Status: AC

## 2021-10-22 MED ORDER — THIAMINE HCL 100 MG PO TABS: 100.0000 mg | ORAL_TABLET | Freq: Every day | ORAL | 0 refills | Status: AC

## 2021-10-22 MED ORDER — MAGNESIUM OXIDE -MG SUPPLEMENT 400 (240 MG) MG PO TABS
400.0000 mg | ORAL_TABLET | Freq: Once | ORAL | Status: AC
Start: 2021-10-22 — End: 2021-10-22
  Administered 2021-10-22: 14:00:00 400 mg via ORAL
  Filled 2021-10-22: qty 1

## 2021-10-22 NOTE — Discharge Instructions (Addendum)
Your magnesium is low.  Please take the magnesium supplements twice daily for the next 5 days.  Please follow-up with your primary care provider to have a repeat level checked within about 1 week.  Please also start taking the thiamine daily.

## 2021-10-22 NOTE — ED Notes (Signed)
See triage note  presents with some n/v  states feels dehydrated  has been drinking heavily recently

## 2021-10-22 NOTE — ED Provider Notes (Signed)
[facility]  ____________________________________________   Event Date/Time   First MD Initiated Contact with Patient 10/22/21 1207     (approximate)  I have reviewed the triage vital signs and the nursing notes.   HISTORY  Chief Complaint Vomiting    HPI Russell Hansen is a 40 y.o. male with past medical history of alcohol use disorder, asthma, anemia who presents with leg cramps.  Drinks alcohol daily.  He is trying to quit and has scheduled for outpatient rehab next week.  He has been slowly trying to cut back but not stopping completely to avoid withdrawal.  He notes that over the last several days he has had lower extremity cramping and intermittent numbness tingling.  He says he had a similar sensation after surgery several years ago when he had hydrocodone.  He also has intermittent nausea and vomiting but not daily has no nausea vomiting currently.  Denies abdominal pain.  No fevers chills chest pain shortness of breath.  Patient has had difficulty sleeping due to the lower extremity cramping.         Past Medical History:  Diagnosis Date   Alcohol abuse    Anemia    Anxiety    Arthritis    Asthma    EXERCISE INDUCED-NO INHALERS   Complication of anesthesia    PT STATES HE GETS REALLY ANXIOUS WITH ANESTHESIA  AND HAS TO BE GIVEN SOMETHING PRIOR TO GOING BACK TO OR   Elevated liver enzymes    GERD (gastroesophageal reflux disease)    OCC   Hypertension     There are no problems to display for this patient.   Past Surgical History:  Procedure Laterality Date   CHONDROPLASTY Right 03/05/2018   Procedure: CHONDROPLASTY;  Surgeon: Signa Kell, MD;  Location: [facility] ORS;  Service: Orthopedics;  Laterality: Right;   KNEE ARTHROSCOPY WITH MENISCAL REPAIR Right 03/05/2018   Procedure: KNEE ARTHROSCOPY WITH MENISCAL REPAIR;  Surgeon: Signa Kell, MD;  Location: [facility] ORS;  Service: Orthopedics;  Laterality: Right;   KNEE SURGERY Bilateral  2003, 2013   ACL REPAIR   MOUTH SURGERY     PROSTATE BIOPSY N/A 09/02/2021   Procedure: PROSTATE BIOPSY [name];  Surgeon: Orson Ape, MD;  Location: [facility] ORS;  Service: Urology;  Laterality: N/A;   WISDOM TOOTH EXTRACTION      Prior to Admission medications   Medication Sig Start Date End Date Taking? Authorizing Provider  Magnesium Oxide 400 MG CAPS Take 1 capsule (400 mg total) by mouth in the morning and at bedtime for 5 days. 10/22/21 10/27/21 Yes Georga Hacking, MD  thiamine 100 MG tablet Take 1 tablet (100 mg total) by mouth daily. 10/22/21 11/21/21 Yes Georga Hacking, MD  acetaminophen (TYLENOL) 500 MG tablet Take 1,000 mg by mouth every 6 (six) hours as needed for moderate pain.    [provider]  albuterol (PROVENTIL HFA;VENTOLIN HFA) 108 (90 Base) MCG/ACT inhaler Inhale 2 puffs into the lungs every 6 (six) hours as needed for wheezing or shortness of breath. 03/05/18   Signa Kell, MD  amLODipine (NORVASC) 10 MG tablet Take 10 mg by mouth every morning.    [provider]  Cyanocobalamin (B-12 PO) Take 1 tablet by mouth daily.    [provider]  Esomeprazole Magnesium 20 MG TBEC Take 20 mg by mouth daily as needed (acid reflux).    [provider]  HYDROcodone-acetaminophen (NORCO) 5-325 MG tablet Take 1-2 tablets by mouth every  4 (four) hours as needed for moderate pain or severe pain. Patient not taking: No sig reported 03/05/18   Signa Kell, MD  levofloxacin (LEVAQUIN) 500 MG tablet Take 1 tablet (500 mg total) by mouth daily. 09/02/21   Orson Ape, MD  Multiple Vitamins-Minerals (MULTIVITAMIN WITH MINERALS) tablet Take 1 tablet by mouth daily.    [provider]  ondansetron (ZOFRAN ODT) 4 MG disintegrating tablet Take 1 tablet (4 mg total) by mouth every 8 (eight) hours as needed for nausea or vomiting. Patient not taking: No sig reported 03/05/18   Signa Kell, MD  sildenafil (VIAGRA) 100 MG tablet Take 100 mg by  mouth daily as needed for erectile dysfunction.     [provider]  VITAMIN D PO Take 1 tablet by mouth daily.    [provider]    Allergies Doxycycline  No family history on file.  Social History Social History   Tobacco Use   Smoking status: Some Days    Types: Cigars   Smokeless tobacco: Never  Vaping Use   Vaping Use: Former  Substance Use Topics   Alcohol use: Yes    Comment: BEER AND WINE OCC-STOPPED HARD LIQOUR MARCH 2018   Drug use: Yes    Types: Marijuana    Comment: only in the vape    Review of Systems   Review of Systems  Constitutional:  Positive for appetite change. Negative for chills and fever.  Respiratory:  Negative for shortness of breath.   Cardiovascular:  Negative for chest pain.  Gastrointestinal:  Positive for nausea and vomiting. Negative for abdominal pain.  Musculoskeletal:  Positive for arthralgias and myalgias.  All other systems reviewed and are negative.  Physical Exam Updated Vital Signs BP 110/78    Pulse 100    Temp 98.4 F (36.9 C) (Oral)    Resp 18    Ht 5\' 8"  (1.727 m)    Wt 66.7 kg    SpO2 97%    BMI 22.35 kg/m   Physical Exam Vitals and nursing note reviewed.  Constitutional:      General: He is not in acute distress.    Appearance: Normal appearance.  HENT:     Head: Normocephalic and atraumatic.  Eyes:     General: No scleral icterus.    Conjunctiva/sclera: Conjunctivae normal.  Cardiovascular:     Rate and Rhythm: Normal rate and regular rhythm.  Pulmonary:     Effort: Pulmonary effort is normal. No respiratory distress.     Breath sounds: Normal breath sounds. No wheezing.  Abdominal:     General: Abdomen is flat. There is no distension.     Palpations: Abdomen is soft.     Tenderness: There is no abdominal tenderness.  Musculoskeletal:        General: No tenderness, deformity or signs of injury.     Cervical back: Normal range of motion.  Skin:    Coloration: Skin is not jaundiced or pale.   Neurological:     General: No focal deficit present.     Mental Status: He is alert and oriented to person, place, and time. Mental status is at baseline.  Psychiatric:        Mood and Affect: Mood normal.        Behavior: Behavior normal.     LABS (all labs ordered are listed, but only abnormal results are displayed)  Labs Reviewed  COMPREHENSIVE METABOLIC PANEL - Abnormal; Notable for the following components:  Result Value   Sodium 131 (*)    Potassium 3.3 (*)    Chloride 97 (*)    Glucose, Bld 109 (*)    AST 217 (*)    ALT 88 (*)    All other components within normal limits  ETHANOL - Abnormal; Notable for the following components:   Alcohol, Ethyl (B) 86 (*)    All other components within normal limits  CBC - Abnormal; Notable for the following components:   RBC 3.65 (*)    HCT 37.2 (*)    MCV 101.9 (*)    MCH 35.9 (*)    RDW 11.4 (*)    All other components within normal limits  URINE DRUG SCREEN, QUALITATIVE ([facility] ONLY) - Abnormal; Notable for the following components:   Cannabinoid 50 Ng, Ur  POSITIVE (*)    All other components within normal limits  MAGNESIUM - Abnormal; Notable for the following components:   Magnesium 1.5 (*)    All other components within normal limits   ____________________________________________  EKG   ____________________________________________  RADIOLOGY I, Randol Kern, personally viewed and evaluated these images (plain radiographs) as part of my medical decision making, as well as reviewing the written report by the radiologist.  ED MD interpretation:      ____________________________________________   PROCEDURES  Procedure(s) performed (including Critical Care):  Procedures   ____________________________________________   INITIAL IMPRESSION / ASSESSMENT AND PLAN / ED COURSE     Patient is a 40 year old male with a history of alcohol use disorder presents with lower abdominal cramping intermittent  nausea and vomiting over the last several days.  Patient is scheduled to go to outpatient detox next week and has been slowly cutting back his alcohol.  Has had difficulty sleeping due to cramping which she describes as a intermittent pricking sensation in his lower extremities, which she describes as similar when he had hydrocodone several years ago.  He appears comfortable on exam.  Heart rate slightly elevated on arrival but normalized.  Does not appear tremulous or having active withdrawal.  His abdomen is benign.  Extremities are nontender throughout.  Labs are notable for mildly elevated AST and ALT actually improved from prior.  His potassium is mildly low at 3.3.  Sodium is normal.  We will check magnesium as this could be causing his cramping.  Otherwise I advised that he continue to take his multivitamin and will prescribe thiamine.  We discussed not stopping alcohol intake completely in order to avoid withdrawal symptoms prior to getting into rehab next week.  Patient's mag is slightly low at 1.5.  We will supplement twice daily for the next 5 days.  Advised that he follow-up with his primary care provider to have a repeat level done in about 1 week.      ____________________________________________   FINAL CLINICAL IMPRESSION(S) / ED DIAGNOSES  Final diagnoses:  Muscle cramping  Hypomagnesemia     ED Discharge Orders          Ordered    Magnesium Oxide 400 MG CAPS  2 times daily        10/22/21 1347    thiamine 100 MG tablet  Daily        10/22/21 1347             Note:  This document was prepared using Dragon voice recognition software and may include unintentional dictation errors.    Georga Hacking, MD 10/22/21 [phone]

## 2021-10-22 NOTE — ED Triage Notes (Addendum)
Patient to ER for c/o "dehydration". Patient states he has had the same for approx one month, been seen in the past for the same. States he knows the cause, that he has had "a rough last few months and has been drinking more than usual". Patient reports drinking 1/2 a fifth of liquor per day some days. Denies any h/o seizure activity with cessation. Last drink was day before yesterday, had some "shakes and balance problems" yesterday. Reports vomiting many mornings (one per day max).

## 2022-11-04 IMAGING — MR MR PROSTATE WO/W CM
56 series · 56 of 56 positions shown · IV contrast (6ml Gadavist)
Comparison: None.

CLINICAL DATA: Elevated PSA level of 5.0 07/26/2021

EXAM:
MR PROSTATE WITHOUT AND WITH CONTRAST
TECHNIQUE: Multiplanar multisequence MRI images were obtained of the pelvis
centered about the prostate. Pre and post contrast images were
obtained.
CONTRAST:  6mL GADAVIST GADOBUTROL 1 MMOL/ML IV SOLN

[Series 4: ax in&out whole · axial · 3.0mm · 1.19mm/px · 1 of 88 slices shown (1 of 2)]
[im 1/88]
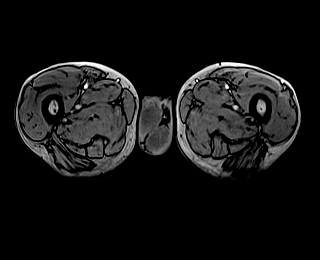

[Series 5: ax in&out whole · axial · 3.0mm · 1.19mm/px · 1 of 88 slices shown (2 of 2)]
[im 1/88]
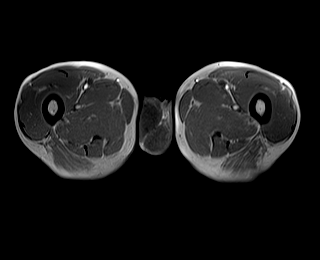

[Series 6: T2 · coronal · 3.0mm · 0.70mm/px · 1 of 35 slices shown (1 of 3)]
[im 1/35]
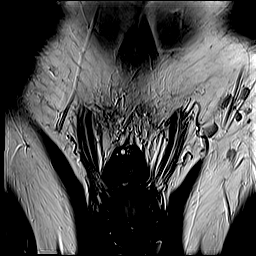

[Series 7: T2 · axial · 3.0mm · 0.56mm/px · 1 of 25 slices shown (2 of 3)]
[im 1/25]
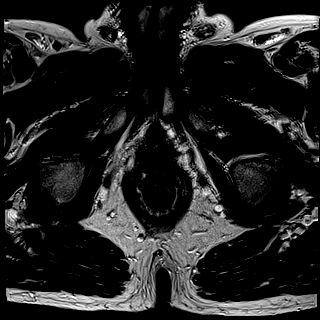

[Series 8: DWI · axial · 3.0mm · 0.86mm/px · 1 of 75 slices shown (1 of 3)]
[im 1/75]
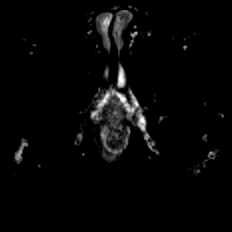

[Series 9: DWI · axial · 3.0mm · 0.86mm/px · 1 of 25 slices shown (2 of 3)]
[im 1/25]
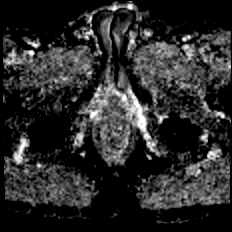

[Series 10: DWI · axial · 3.0mm · 0.86mm/px · 1 of 25 slices shown (3 of 3)]
[im 1/25]
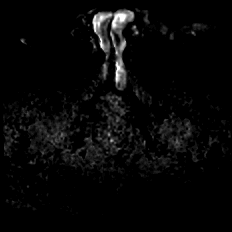

[Series 11: T2 · axial · 1.0mm · 1.04mm/px · 1 of 72 slices shown (3 of 3)]
[im 1/72]
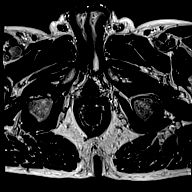

[Series 13: T1 · axial · 3.0mm · 1.15mm/px · 1 of 28 slices shown (1 of 48)]
[im 1/28]
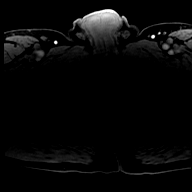

[Series 14: T1 · axial · 3.0mm · 1.15mm/px · 1 of 28 slices shown (2 of 48)]
[im 1/28]
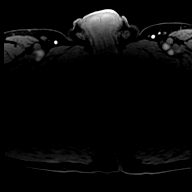

[Series 15: T1 · axial · 3.0mm · 1.15mm/px · 1 of 28 slices shown (3 of 48)]
[im 1/28]
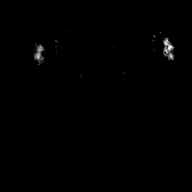

[Series 16: T1 · axial · 3.0mm · 1.15mm/px · 1 of 28 slices shown (4 of 48)]
[im 1/28]
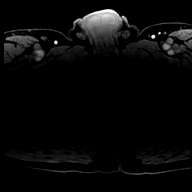

[Series 17: T1 · axial · 3.0mm · 1.15mm/px · 1 of 28 slices shown (5 of 48)]
[im 1/28]
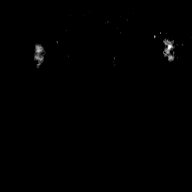

[Series 18: T1 · axial · 3.0mm · 1.15mm/px · 1 of 28 slices shown (6 of 48)]
[im 1/28]
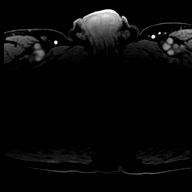

[Series 19: T1 · axial · 3.0mm · 1.15mm/px · 1 of 28 slices shown (7 of 48)]
[im 1/28]
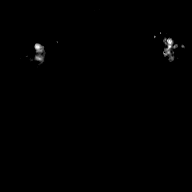

[Series 20: T1 · axial · 3.0mm · 1.15mm/px · 1 of 28 slices shown (8 of 48)]
[im 1/28]
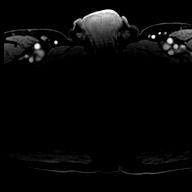

[Series 21: T1 · axial · 3.0mm · 1.15mm/px · 1 of 28 slices shown (9 of 48)]
[im 1/28]
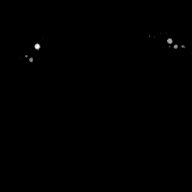

[Series 22: T1 · axial · 3.0mm · 1.15mm/px · 1 of 28 slices shown (10 of 48)]
[im 1/28]
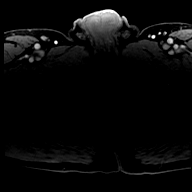

[Series 23: T1 · axial · 3.0mm · 1.15mm/px · 1 of 28 slices shown (11 of 48)]
[im 1/28]
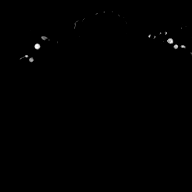

[Series 24: T1 · axial · 3.0mm · 1.15mm/px · 1 of 28 slices shown (12 of 48)]
[im 1/28]
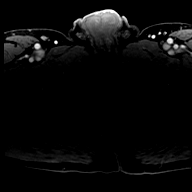

[Series 25: T1 · axial · 3.0mm · 1.15mm/px · 1 of 28 slices shown (13 of 48)]
[im 1/28]
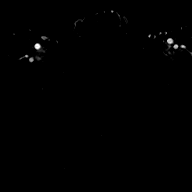

[Series 26: T1 · axial · 3.0mm · 1.15mm/px · 1 of 28 slices shown (14 of 48)]
[im 1/28]
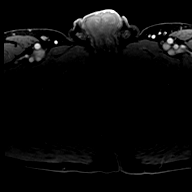

[Series 27: T1 · axial · 3.0mm · 1.15mm/px · 1 of 28 slices shown (15 of 48)]
[im 1/28]
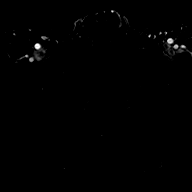

[Series 28: T1 · axial · 3.0mm · 1.15mm/px · 1 of 28 slices shown (16 of 48)]
[im 1/28]
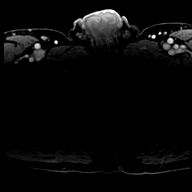

[Series 29: T1 · axial · 3.0mm · 1.15mm/px · 1 of 28 slices shown (17 of 48)]
[im 1/28]
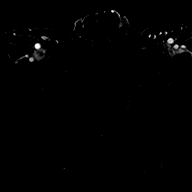

[Series 30: T1 · axial · 3.0mm · 1.15mm/px · 1 of 28 slices shown (18 of 48)]
[im 1/28]
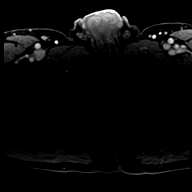

[Series 31: T1 · axial · 3.0mm · 1.15mm/px · 1 of 28 slices shown (19 of 48)]
[im 1/28]
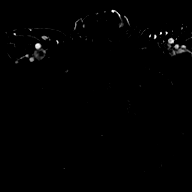

[Series 32: T1 · axial · 3.0mm · 1.15mm/px · 1 of 28 slices shown (20 of 48)]
[im 1/28]
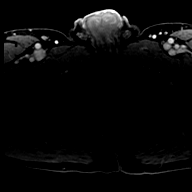

[Series 33: T1 · axial · 3.0mm · 1.15mm/px · 1 of 28 slices shown (21 of 48)]
[im 1/28]
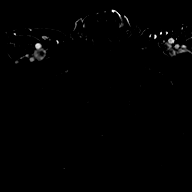

[Series 34: T1 · axial · 3.0mm · 1.15mm/px · 1 of 28 slices shown (22 of 48)]
[im 1/28]
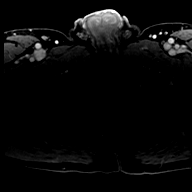

[Series 35: T1 · axial · 3.0mm · 1.15mm/px · 1 of 28 slices shown (23 of 48)]
[im 1/28]
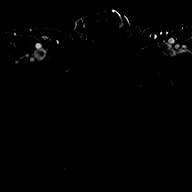

[Series 36: T1 · axial · 3.0mm · 1.15mm/px · 1 of 28 slices shown (24 of 48)]
[im 1/28]
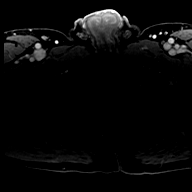

[Series 37: T1 · axial · 3.0mm · 1.15mm/px · 1 of 28 slices shown (25 of 48)]
[im 1/28]
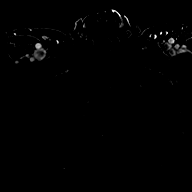

[Series 38: T1 · axial · 3.0mm · 1.15mm/px · 1 of 28 slices shown (26 of 48)]
[im 1/28]
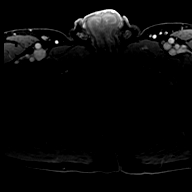

[Series 39: T1 · axial · 3.0mm · 1.15mm/px · 1 of 28 slices shown (27 of 48)]
[im 1/28]
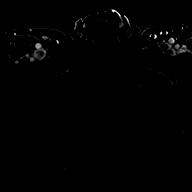

[Series 40: T1 · axial · 3.0mm · 1.15mm/px · 1 of 28 slices shown (28 of 48)]
[im 1/28]
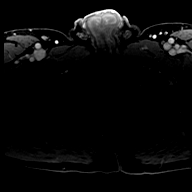

[Series 41: T1 · axial · 3.0mm · 1.15mm/px · 1 of 28 slices shown (29 of 48)]
[im 1/28]
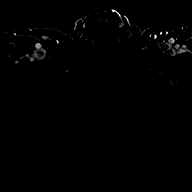

[Series 42: T1 · axial · 3.0mm · 1.15mm/px · 1 of 28 slices shown (30 of 48)]
[im 1/28]
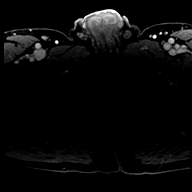

[Series 43: T1 · axial · 3.0mm · 1.15mm/px · 1 of 28 slices shown (31 of 48)]
[im 1/28]
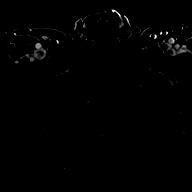

[Series 44: T1 · axial · 3.0mm · 1.15mm/px · 1 of 28 slices shown (32 of 48)]
[im 1/28]
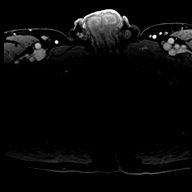

[Series 45: T1 · axial · 3.0mm · 1.15mm/px · 1 of 28 slices shown (33 of 48)]
[im 1/28]
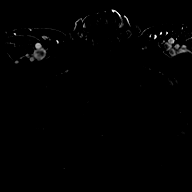

[Series 46: T1 · axial · 3.0mm · 1.15mm/px · 1 of 28 slices shown (34 of 48)]
[im 1/28]
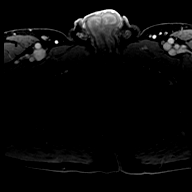

[Series 47: T1 · axial · 3.0mm · 1.15mm/px · 1 of 28 slices shown (35 of 48)]
[im 1/28]
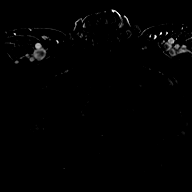

[Series 48: T1 · axial · 3.0mm · 1.15mm/px · 1 of 28 slices shown (36 of 48)]
[im 1/28]
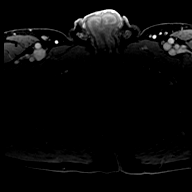

[Series 49: T1 · axial · 3.0mm · 1.15mm/px · 1 of 28 slices shown (37 of 48)]
[im 1/28]
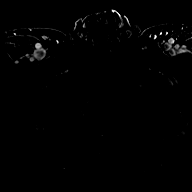

[Series 50: T1 · axial · 3.0mm · 1.15mm/px · 1 of 28 slices shown (38 of 48)]
[im 1/28]
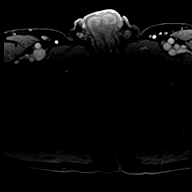

[Series 51: T1 · axial · 3.0mm · 1.15mm/px · 1 of 28 slices shown (39 of 48)]
[im 1/28]
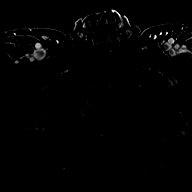

[Series 52: T1 · axial · 3.0mm · 1.15mm/px · 1 of 28 slices shown (40 of 48)]
[im 1/28]
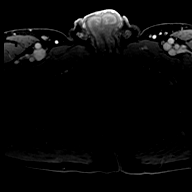

[Series 53: T1 · axial · 3.0mm · 1.15mm/px · 1 of 28 slices shown (41 of 48)]
[im 1/28]
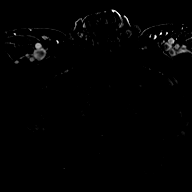

[Series 54: T1 · axial · 3.0mm · 1.15mm/px · 1 of 28 slices shown (42 of 48)]
[im 1/28]
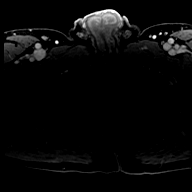

[Series 55: T1 · axial · 3.0mm · 1.15mm/px · 1 of 28 slices shown (43 of 48)]
[im 1/28]
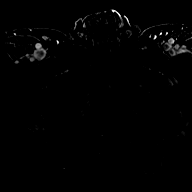

[Series 56: T1 · axial · 3.0mm · 1.15mm/px · 1 of 28 slices shown (44 of 48)]
[im 1/28]
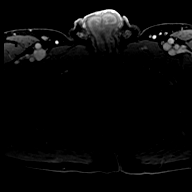

[Series 57: T1 · axial · 3.0mm · 1.15mm/px · 1 of 28 slices shown (45 of 48)]
[im 1/28]
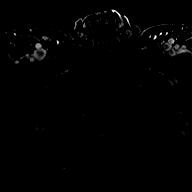

[Series 58: T1 · axial · 3.0mm · 1.15mm/px · 1 of 28 slices shown (46 of 48)]
[im 1/28]
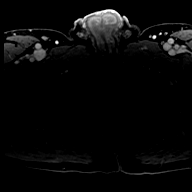

[Series 59: T1 · axial · 3.0mm · 1.15mm/px · 1 of 28 slices shown (47 of 48)]
[im 1/28]
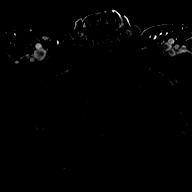

[Series 60: T1 · axial · 3.0mm · 1.15mm/px · 1 of 28 slices shown (48 of 48)]
[im 1/28]
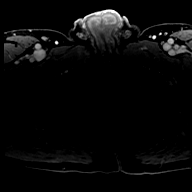

[56 of 56 positions shown; findings below may reference images not displayed]

FINDINGS: Prostate:

There is diffuse low T2 signal stranding throughout the peripheral
zones is likely postinflammatory considered PI-RADS category 2.

Region of interest # 1: PI-RADS category 3 lesion of the left
posterolateral peripheral zone mid gland apex, with reduced T2
signal no focal early enhancement. This measures 0.94 cc (1.4 by
by 1.4 cm) and is shown for example on image 48 series 11.

Region of interest # 2: PI-RADS category 3 lesion the posteromedial
peripheral zones, right greater than, in the mid gland and apex
without associated focal early enhancement. This measures 0.45 cc
(1.4 by 0.4 by 0.6 cm) and is shown for example on image 55 series
11.

Volume: 3D volumetric analysis: Prostate volume 21.76 cc (4.9 by
by 3.2 cm).

Transcapsular spread:  Absent

Seminal vesicle involvement: Absent

Neurovascular bundle involvement: Absent

Pelvic adenopathy: Absent

Bone metastasis: Absent

Other findings: No supplemental non-categorized findings.
IMPRESSION: 1. Two PI-RADS category 3 lesions are present in the peripheral
zone. Targeting data sent to [facility].
2. Background low T2 signal stranding in the peripheral zone is
considered PI-RADS category 2 and likely postinflammatory.

## 2023-02-16 DIAGNOSIS — G4739 Other sleep apnea: Secondary | ICD-10-CM

## 2023-02-16 HISTORY — DX: Other sleep apnea: G47.39

## 2023-05-25 ENCOUNTER — Other Ambulatory Visit: Payer: Self-pay | Admitting: Orthopedic Surgery

## 2023-05-25 DIAGNOSIS — M4807 Spinal stenosis, lumbosacral region: Secondary | ICD-10-CM

## 2023-05-25 DIAGNOSIS — G8929 Other chronic pain: Secondary | ICD-10-CM

## 2023-05-25 DIAGNOSIS — M5416 Radiculopathy, lumbar region: Secondary | ICD-10-CM

## 2023-06-05 ENCOUNTER — Encounter: Payer: Self-pay | Admitting: Orthopedic Surgery

## 2023-06-07 ENCOUNTER — Ambulatory Visit
Admission: RE | Admit: 2023-06-07 | Discharge: 2023-06-07 | Disposition: A | Discharge status: Home / Self Care | Payer: BC Managed Care – PPO | Source: Ambulatory Visit | Attending: Orthopedic Surgery | Admitting: Orthopedic Surgery

## 2023-06-07 DIAGNOSIS — M5416 Radiculopathy, lumbar region: Secondary | ICD-10-CM

## 2023-06-07 DIAGNOSIS — M4807 Spinal stenosis, lumbosacral region: Secondary | ICD-10-CM

## 2023-06-07 DIAGNOSIS — G8929 Other chronic pain: Secondary | ICD-10-CM

## 2023-06-15 ENCOUNTER — Other Ambulatory Visit: Payer: Self-pay | Admitting: Orthopedic Surgery

## 2023-06-15 DIAGNOSIS — M7061 Trochanteric bursitis, right hip: Secondary | ICD-10-CM

## 2023-06-15 DIAGNOSIS — G8929 Other chronic pain: Secondary | ICD-10-CM

## 2023-06-22 ENCOUNTER — Other Ambulatory Visit: Payer: Self-pay | Admitting: Orthopedic Surgery

## 2023-07-01 ENCOUNTER — Ambulatory Visit
Admission: RE | Admit: 2023-07-01 | Discharge: 2023-07-01 | Disposition: A | Discharge status: Home / Self Care | Payer: BC Managed Care – PPO | Source: Ambulatory Visit | Attending: Orthopedic Surgery | Admitting: Orthopedic Surgery

## 2023-07-01 DIAGNOSIS — M7061 Trochanteric bursitis, right hip: Secondary | ICD-10-CM

## 2023-07-01 DIAGNOSIS — G8929 Other chronic pain: Secondary | ICD-10-CM

## 2023-07-14 ENCOUNTER — Other Ambulatory Visit: Payer: Self-pay

## 2023-07-14 ENCOUNTER — Encounter: Payer: Self-pay | Admitting: Emergency Medicine

## 2023-07-14 ENCOUNTER — Emergency Department: Payer: BC Managed Care – PPO

## 2023-07-14 ENCOUNTER — Emergency Department
Admission: EM | Admit: 2023-07-14 | Discharge: 2023-07-14 | Disposition: A | Discharge status: Home / Self Care | Payer: BC Managed Care – PPO | Attending: Emergency Medicine | Admitting: Emergency Medicine

## 2023-07-14 DIAGNOSIS — D696 Thrombocytopenia, unspecified: Secondary | ICD-10-CM | POA: Diagnosis not present

## 2023-07-14 DIAGNOSIS — F1090 Alcohol use, unspecified, uncomplicated: Secondary | ICD-10-CM | POA: Insufficient documentation

## 2023-07-14 DIAGNOSIS — Z789 Other specified health status: Secondary | ICD-10-CM

## 2023-07-14 DIAGNOSIS — J069 Acute upper respiratory infection, unspecified: Secondary | ICD-10-CM | POA: Insufficient documentation

## 2023-07-14 DIAGNOSIS — R059 Cough, unspecified: Secondary | ICD-10-CM | POA: Diagnosis present

## 2023-07-14 DIAGNOSIS — Z1152 Encounter for screening for COVID-19: Secondary | ICD-10-CM | POA: Insufficient documentation

## 2023-07-14 LAB — PROTIME-INR
INR: 1 (ref 0.8–1.2)
Prothrombin Time: 12.8 seconds (ref 11.4–15.2)

## 2023-07-14 LAB — URINALYSIS, ROUTINE W REFLEX MICROSCOPIC
Bacteria, UA: NONE SEEN
Bilirubin Urine: NEGATIVE
Glucose, UA: NEGATIVE mg/dL
Hgb urine dipstick: NEGATIVE
Ketones, ur: NEGATIVE mg/dL
Leukocytes,Ua: NEGATIVE
Nitrite: NEGATIVE
Protein, ur: 100 mg/dL — AB
Specific Gravity, Urine: 1.025 (ref 1.005–1.030)
pH: 6 (ref 5.0–8.0)

## 2023-07-14 LAB — CBC WITH DIFFERENTIAL/PLATELET
Abs Immature Granulocytes: 0.04 10*3/uL (ref 0.00–0.07)
Basophils Absolute: 0 10*3/uL (ref 0.0–0.1)
Basophils Relative: 0 %
Eosinophils Absolute: 0 10*3/uL (ref 0.0–0.5)
Eosinophils Relative: 0 %
HCT: 36.3 % — ABNORMAL LOW (ref 39.0–52.0)
Hemoglobin: 13 g/dL (ref 13.0–17.0)
Immature Granulocytes: 0 %
Lymphocytes Relative: 8 %
Lymphs Abs: 0.8 10*3/uL (ref 0.7–4.0)
MCH: 36.9 pg — ABNORMAL HIGH (ref 26.0–34.0)
MCHC: 35.8 g/dL (ref 30.0–36.0)
MCV: 103.1 fL — ABNORMAL HIGH (ref 80.0–100.0)
Monocytes Absolute: 0.6 10*3/uL (ref 0.1–1.0)
Monocytes Relative: 6 %
Neutro Abs: 8.5 10*3/uL — ABNORMAL HIGH (ref 1.7–7.7)
Neutrophils Relative %: 86 %
Platelets: 142 10*3/uL — ABNORMAL LOW (ref 150–400)
RBC: 3.52 MIL/uL — ABNORMAL LOW (ref 4.22–5.81)
RDW: 12.1 % (ref 11.5–15.5)
WBC: 9.9 10*3/uL (ref 4.0–10.5)
nRBC: 0 % (ref 0.0–0.2)

## 2023-07-14 LAB — BASIC METABOLIC PANEL
Anion gap: 17 — ABNORMAL HIGH (ref 5–15)
BUN: 15 mg/dL (ref 6–20)
CO2: 23 mmol/L (ref 22–32)
Calcium: 9.5 mg/dL (ref 8.9–10.3)
Chloride: 96 mmol/L — ABNORMAL LOW (ref 98–111)
Creatinine, Ser: 0.72 mg/dL (ref 0.61–1.24)
GFR, Estimated: >60 mL/min (ref >60)
Glucose, Bld: 108 mg/dL — ABNORMAL HIGH (ref 70–99)
Potassium: 3.7 mmol/L (ref 3.5–5.1)
Sodium: 136 mmol/L (ref 135–145)

## 2023-07-14 LAB — TROPONIN I (HIGH SENSITIVITY): Troponin I (High Sensitivity): 8 ng/L (ref <18)

## 2023-07-14 LAB — LIPASE, BLOOD: Lipase: 46 U/L (ref 11–51)

## 2023-07-14 LAB — RESP PANEL BY RT-PCR (RSV, FLU A&B, COVID)  RVPGX2
Influenza A by PCR: NEGATIVE
Influenza B by PCR: NEGATIVE
Resp Syncytial Virus by PCR: NEGATIVE
SARS Coronavirus 2 by RT PCR: NEGATIVE

## 2023-07-14 MED ORDER — SODIUM CHLORIDE 0.9 % IV BOLUS
1000.0000 mL | Freq: Once | INTRAVENOUS | Status: AC
  Administered 2023-07-14: 1000 mL via INTRAVENOUS

## 2023-07-14 MED ORDER — ONDANSETRON 4 MG PO TBDP: 4.0000 mg | ORAL_TABLET | Freq: Three times a day (TID) | ORAL | 0 refills | Status: DC | PRN

## 2023-07-14 MED ORDER — BENZONATATE 100 MG PO CAPS: 100.0000 mg | ORAL_CAPSULE | Freq: Three times a day (TID) | ORAL | 0 refills | Status: DC | PRN

## 2023-07-14 MED ORDER — THIAMINE HCL 100 MG PO TABS: 100.0000 mg | ORAL_TABLET | Freq: Every day | ORAL | 11 refills | Status: DC

## 2023-07-14 MED ORDER — OMEPRAZOLE MAGNESIUM 20 MG PO TBEC
20.0000 mg | DELAYED_RELEASE_TABLET | Freq: Every day | ORAL | 0 refills | Status: DC
Start: 2023-07-14 — End: 2023-11-08

## 2023-07-14 MED ORDER — PANTOPRAZOLE SODIUM 40 MG IV SOLR
40.0000 mg | Freq: Once | INTRAVENOUS | Status: AC
  Administered 2023-07-14: 40 mg via INTRAVENOUS
  Filled 2023-07-14: qty 10

## 2023-07-14 MED ORDER — FOLIC ACID 1 MG PO TABS
1.0000 mg | ORAL_TABLET | Freq: Every day | ORAL | 0 refills | Status: DC
Start: 2023-07-14 — End: 2024-05-17

## 2023-07-14 NOTE — ED Notes (Signed)
Pt had blood work at [facility] this morning.

## 2023-07-14 NOTE — ED Provider Notes (Signed)
[facility] Provider Note    Event Date/Time   First MD Initiated Contact with Patient 07/14/23 1108     (approximate)   History   Cough and Hemoptysis   HPI  Russell Hansen is a 42 y.o. male past medical history significant for tobacco use, daily alcohol use, who presents to the emergency department with cough sent from urgent care.  Patient states over the past couple of days has been having a cough with sinus congestion.  Denies any significant chest pain or shortness of breath.  No fever or chills.  States that yesterday had an episode whenever he was coughing so hard and then he threw up and noted a small amount of blood in his vomit.  No episodes of vomiting since that time.  Denies any abdominal pain.  Evaluated at urgent care and sent to the emergency department given his low platelet count which was new.  Does have an significant amount of alcohol use on a regular basis.  Denies any blood in his stool or black dark tarry stool.  Denies any history of cirrhosis.  Prior endoscopy in 2018 that was unremarkable.  Denies any history of DVT or PE.  No leg swelling.  No recent travel or surgery.  Denies any shortness of breath or chest pain at this time.  States that whenever he coughs he has coughs up white phlegm but has not coughed up any blood.     Physical Exam   Triage Vital Signs: ED Triage Vitals  Encounter Vitals Group     BP 07/14/23 1006 123/88     Systolic BP Percentile --      Diastolic BP Percentile --      Pulse Rate 07/14/23 1006 (!) 105     Resp 07/14/23 1006 17     Temp 07/14/23 1008 98.2 F (36.8 C)     Temp src --      SpO2 07/14/23 1006 100 %     Weight 07/14/23 1007 147 lb 0.8 oz (66.7 kg)     Height 07/14/23 1007 5\' 8"  (1.727 m)     Head Circumference --      Peak Flow --      Pain Score 07/14/23 1006 6     Pain Loc --      Pain Education --      Exclude from Growth Chart --     Most recent vital signs: Vitals:    07/14/23 1006 07/14/23 1008  BP: 123/88   Pulse: (!) 105   Resp: 17   Temp:  98.2 F (36.8 C)  SpO2: 100%     Physical Exam Constitutional:      Appearance: He is well-developed.  HENT:     Head: Atraumatic.  Eyes:     Conjunctiva/sclera: Conjunctivae normal.  Cardiovascular:     Rate and Rhythm: Regular rhythm. Tachycardia present.  Pulmonary:     Effort: No respiratory distress.     Breath sounds: No wheezing or rales.  Abdominal:     Tenderness: There is no abdominal tenderness.  Musculoskeletal:        General: Normal range of motion.     Cervical back: Normal range of motion.     Right lower leg: No edema.     Left lower leg: No edema.  Skin:    General: Skin is warm.     Capillary Refill: Capillary refill takes less than 2 seconds.  Neurological:  Mental Status: He is alert. Mental status is at baseline.  Psychiatric:        Mood and Affect: Mood normal.     IMPRESSION / MDM / ASSESSMENT AND PLAN / ED COURSE  I reviewed the triage vital signs and the nursing notes.  Differential diagnosis including bronchitis, pneumonia, viral illness including COVID/influenza, gastritis/PUD, progression to cirrhosis, electrolyte abnormality, pancreatitis  On chart review elevated anion gap and thrombocytopenia.  EKG  I, Corena Herter, the attending physician, personally viewed and interpreted this ECG.  Sinus tachycardia with heart rate of 108.  Normal intervals.  No obvious chamber enlargement.  T waves that are inverted to the lateral and inferior leads.  No significant ST elevation.  This is change when compared to prior EKG  Sinus tachycardia while on cardiac telemetry.  RADIOLOGY I independently reviewed imaging, my interpretation of imaging: Chest x-ray with no signs of pneumonia  LABS (all labs ordered are listed, but only abnormal results are displayed) Labs interpreted as -    Labs Reviewed  URINALYSIS, ROUTINE W REFLEX MICROSCOPIC - Abnormal; Notable  for the following components:      Result Value   Color, Urine YELLOW (*)    APPearance CLEAR (*)    Protein, ur 100 (*)    All other components within normal limits  CBC WITH DIFFERENTIAL/PLATELET - Abnormal; Notable for the following components:   RBC 3.52 (*)    HCT 36.3 (*)    MCV 103.1 (*)    MCH 36.9 (*)    Platelets 142 (*)    Neutro Abs 8.5 (*)    All other components within normal limits  BASIC METABOLIC PANEL - Abnormal; Notable for the following components:   Chloride 96 (*)    Glucose, Bld 108 (*)    Anion gap 17 (*)    All other components within normal limits  RESP PANEL BY RT-PCR (RSV, FLU A&B, COVID)  RVPGX2  LIPASE, BLOOD  PROTIME-INR  CBG MONITORING, ED  TROPONIN I (HIGH SENSITIVITY)     MDM  COVID and influenza testing are negative.  Patient with no leukocytosis.  No significant anemia.  Does have an elevated MCV.  Platelets are low at 142 which is decreased when compared to his platelets of 200s 1 year ago.  Creatinine at baseline.  Does have an elevated anion gap but no signs of acidosis.  No significant electrolyte abnormalities.  Did have diarrhea, likely the cause of his elevated anion gap some starvation ketosis given that he has not been eating.  Low risk Wells criteria, no active shortness of breath or chest pain, have a low suspicion for acute pulmonary embolism, no hemoptysis.  Troponin is negative, no chest pain at this time, have a low suspicion for ACS.  Heart score of 3.  Most likely gastritis/PUD in the setting of alcohol use, also recently had a steroid injection.  Given IV fluids, IV Protonix  On reevaluation patient is feeling better.  Long discussion about alcohol cessation.  Will restart the patient on a PPI.  Given a prescription for vitamins, PPI, Zofran and Tessalon Perles.  Discussed return precautions for any worsening symptoms, shortness of breath or chest pain.  Given information to follow-up with gastroenterology given concern for  peptic ulcer disease and possible cirrhosis.  Discussed close follow-up with primary care physician and further workup for thrombocytopenia.  Given outpatient resources for detox programs.     PROCEDURES:  Critical Care performed: No  Procedures  Patient's presentation  is most consistent with acute presentation with potential threat to life or bodily function.   MEDICATIONS ORDERED IN ED: Medications  sodium chloride 0.9 % bolus 1,000 mL (1,000 mLs Intravenous New Bag/Given 07/14/23 1305)  pantoprazole (PROTONIX) injection 40 mg (40 mg Intravenous Given 07/14/23 1306)    FINAL CLINICAL IMPRESSION(S) / ED DIAGNOSES   Final diagnoses:  Viral URI with cough  Thrombocytopenia (HCC)  Alcohol use     Rx / DC Orders   ED Discharge Orders          Ordered    omeprazole (PRILOSEC OTC) 20 MG tablet  Daily        07/14/23 1242    ondansetron (ZOFRAN-ODT) 4 MG disintegrating tablet  Every 8 hours PRN        07/14/23 1242    folic acid (FOLVITE) 1 MG tablet  Daily        07/14/23 1335    thiamine (VITAMIN B1) 100 MG tablet  Daily        07/14/23 1335    benzonatate (TESSALON PERLES) 100 MG capsule  3 times daily PRN        07/14/23 1346             Note:  This document was prepared using Dragon voice recognition software and may include unintentional dictation errors.   Corena Herter, MD 07/14/23 1348

## 2023-07-14 NOTE — ED Triage Notes (Signed)
Pt here from [facility] with abnormal labs and coughing, Pt states he coughed up blood once today. Pt platelet count low, 137. Pt states he has been a little SOB on yesterday. Pt endorsed chills as well.

## 2023-07-14 NOTE — Discharge Instructions (Addendum)
You were seen in the emergency department for cough and not feeling well.  You had a chest x-ray did not show any signs of pneumonia.  Your COVID and influenza testing were negative.  Most likely have a viral illness.  Do not feel that you need antibiotics at this time.  On lab work you are found to have a low platelet count, concerned this is from the significant amount of alcohol that you drink.  It is importantly follow-up with your primary care physician so they can continue to workup her platelet disorder.  You are given information for some resources to try to decrease/stop drinking alcohol.  You are also started on an acid reducing medication given concern for gastritis/peptic ulcer disease.  You are given information to follow-up with gastroenterology for possible endoscopy given your blood in your vomit and upper abdominal pain.  Return to the emergency department if you have worsening symptoms.  Stay hydrated and drink plenty of fluids.  Return to the emergency department if you have worsening shortness of breath, vomiting or not feeling well.  Thank you for choosing [place] for your health care, it was my pleasure to care for you today!  Corena Herter, MD

## 2023-07-26 ENCOUNTER — Encounter: Payer: Self-pay | Admitting: Oncology

## 2023-07-26 ENCOUNTER — Inpatient Hospital Stay: Payer: BC Managed Care – PPO | Attending: Oncology | Admitting: Oncology

## 2023-07-26 ENCOUNTER — Inpatient Hospital Stay: Payer: BC Managed Care – PPO

## 2023-07-26 ENCOUNTER — Other Ambulatory Visit: Payer: Self-pay | Admitting: Orthopedic Surgery

## 2023-07-26 VITALS — BP 107/86 | HR 105 | Temp 97.6°F | Resp 18 | Wt 132.6 lb

## 2023-07-26 DIAGNOSIS — Z79899 Other long term (current) drug therapy: Secondary | ICD-10-CM | POA: Diagnosis not present

## 2023-07-26 DIAGNOSIS — R7989 Other specified abnormal findings of blood chemistry: Secondary | ICD-10-CM

## 2023-07-26 DIAGNOSIS — D696 Thrombocytopenia, unspecified: Secondary | ICD-10-CM

## 2023-07-26 DIAGNOSIS — F17290 Nicotine dependence, other tobacco product, uncomplicated: Secondary | ICD-10-CM

## 2023-07-26 DIAGNOSIS — Z789 Other specified health status: Secondary | ICD-10-CM | POA: Insufficient documentation

## 2023-07-26 LAB — HEPATITIS PANEL, ACUTE
HCV Ab: NONREACTIVE
Hep A IgM: NONREACTIVE
Hep B C IgM: NONREACTIVE
Hepatitis B Surface Ag: NONREACTIVE

## 2023-07-26 LAB — CBC WITH DIFFERENTIAL/PLATELET
Abs Immature Granulocytes: 0.11 10*3/uL — ABNORMAL HIGH (ref 0.00–0.07)
Basophils Absolute: 0.1 10*3/uL (ref 0.0–0.1)
Basophils Relative: 0 %
Eosinophils Absolute: 0.1 10*3/uL (ref 0.0–0.5)
Eosinophils Relative: 0 %
HCT: 35.2 % — ABNORMAL LOW (ref 39.0–52.0)
Hemoglobin: 12.4 g/dL — ABNORMAL LOW (ref 13.0–17.0)
Immature Granulocytes: 1 %
Lymphocytes Relative: 12 %
Lymphs Abs: 1.8 10*3/uL (ref 0.7–4.0)
MCH: 37.2 pg — ABNORMAL HIGH (ref 26.0–34.0)
MCHC: 35.2 g/dL (ref 30.0–36.0)
MCV: 105.7 fL — ABNORMAL HIGH (ref 80.0–100.0)
Monocytes Absolute: 1.3 10*3/uL — ABNORMAL HIGH (ref 0.1–1.0)
Monocytes Relative: 8 %
Neutro Abs: 11.9 10*3/uL — ABNORMAL HIGH (ref 1.7–7.7)
Neutrophils Relative %: 79 %
Platelets: 470 10*3/uL — ABNORMAL HIGH (ref 150–400)
RBC: 3.33 MIL/uL — ABNORMAL LOW (ref 4.22–5.81)
RDW: 11.7 % (ref 11.5–15.5)
WBC: 15.2 10*3/uL — ABNORMAL HIGH (ref 4.0–10.5)
nRBC: 0 % (ref 0.0–0.2)

## 2023-07-26 LAB — HEPATIC FUNCTION PANEL
ALT: 121 U/L — ABNORMAL HIGH (ref 0–44)
AST: 108 U/L — ABNORMAL HIGH (ref 15–41)
Albumin: 4.3 g/dL (ref 3.5–5.0)
Alkaline Phosphatase: 71 U/L (ref 38–126)
Bilirubin, Direct: 0.1 mg/dL (ref 0.0–0.2)
Indirect Bilirubin: 0.5 mg/dL (ref 0.3–0.9)
Total Bilirubin: 0.6 mg/dL (ref 0.3–1.2)
Total Protein: 7.1 g/dL (ref 6.5–8.1)

## 2023-07-26 LAB — APTT: aPTT: 28 s (ref 24–36)

## 2023-07-26 LAB — LACTATE DEHYDROGENASE: LDH: 133 U/L (ref 98–192)

## 2023-07-26 LAB — HIV ANTIBODY (ROUTINE TESTING W REFLEX): HIV Screen 4th Generation wRfx: NONREACTIVE

## 2023-07-26 LAB — FOLATE: Folate: 10.1 ng/mL (ref >5.9)

## 2023-07-26 LAB — TECHNOLOGIST SMEAR REVIEW: Plt Morphology: INCREASED

## 2023-07-26 LAB — IMMATURE PLATELET FRACTION: Immature Platelet Fraction: 2.1 % (ref 1.2–8.6)

## 2023-07-26 LAB — VITAMIN B12: Vitamin B-12: 457 pg/mL (ref 180–914)

## 2023-07-26 NOTE — Progress Notes (Signed)
Hematology/Oncology Consult Note [phone] [phone]     REFERRING PROVIDER: Luciana Axe, NP  CHIEF COMPLAINTS/REASON FOR VISIT:  Evaluation of thrombocytopenia  ASSESSMENT & PLAN:   Thrombocytopenia (HCC) New onset, mild thrombocytopenia.  For the work up of patient's thrombocytopenia, I recommend checking CBC;CMP, LDH; smear review, folate, Vitamin B12, hepatitis, HIV, flowcytometry Check US abdomen  Alcohol use Recommend alcohol cessation.   Elevated LFTs Likely due to alcohol use.  Check US abdomen   Orders Placed This Encounter  Procedures   US Abdomen Complete    Standing Status:   Future    Standing Expiration Date:   07/25/2024    Order Specific Question:   Reason for Exam (SYMPTOM  OR DIAGNOSIS REQUIRED)    Answer:   thrombocytopenia, alcohol use.    Order Specific Question:   Preferred imaging location?    Answer:   [facility]   CBC with Differential/Platelet    Standing Status:   Future    Number of Occurrences:   1    Standing Expiration Date:   07/25/2024   Immature Platelet Fraction    Standing Status:   Future    Number of Occurrences:   1    Standing Expiration Date:   07/25/2024   Vitamin B12    Standing Status:   Future    Number of Occurrences:   1    Standing Expiration Date:   07/25/2024   Lactate dehydrogenase    Standing Status:   Future    Number of Occurrences:   1    Standing Expiration Date:   07/25/2024   Flow cytometry panel-leukemia/lymphoma work-up    Standing Status:   Future    Number of Occurrences:   1    Standing Expiration Date:   07/25/2024   Hepatic function panel    Standing Status:   Future    Number of Occurrences:   1    Standing Expiration Date:   07/25/2024   Hepatitis panel, acute    Standing Status:   Future    Number of Occurrences:   1    Standing Expiration Date:   07/25/2024   HIV Antibody (routine testing w rflx)    Standing Status:   Future    Number of Occurrences:   1     Standing Expiration Date:   07/25/2024   APTT    Standing Status:   Future    Number of Occurrences:   1    Standing Expiration Date:   07/25/2024   Folate    Standing Status:   Future    Number of Occurrences:   1    Standing Expiration Date:   07/25/2024   Technologist smear review    Standing Status:   Future    Number of Occurrences:   1    Standing Expiration Date:   07/25/2024    Order Specific Question:   Clinical information:    Answer:   t   Follow up in 3-4 weeks  All questions were answered. The patient knows to call the clinic with any problems, questions or concerns.  Rickard Patience, MD, PhD [facility] Hematology Oncology 07/26/2023   HISTORY OF PRESENTING ILLNESS:  Russell Hansen is a 42 y.o. male who was seen in consultation at the request of Luciana Axe, NP for evaluation of thrombocytopenia  07/14/23 He recently presented to ER for evaluation of cough, sinus congestion, one episode of vomiting, with small amount of  blood in his vomit. Patient had abnormal Labs which showed decreased platelet counts at  142,000, Hb 13, mcv 103.1, normal wbc with elevated ANC.   Reviewed patient's previous labs. Thrombocytopenia is new onset.   Denies weight loss, fever, chills, fatigue, night sweats.  Patient has no easy bruising.    Denies history hepatitis or HIV infection Denies history of chronic liver disease Patient drinks alcohol daily, currently 4 shots of hard liquor mixed with juice. He used to drink about 6 shots.  He is Engineer, site.    MEDICAL HISTORY:  Past Medical History:  Diagnosis Date   Alcohol abuse    Anemia    Anxiety    Arthritis    Asthma    EXERCISE INDUCED-NO INHALERS   Complication of anesthesia    PT STATES HE GETS REALLY ANXIOUS WITH ANESTHESIA  AND HAS TO BE GIVEN SOMETHING PRIOR TO GOING BACK TO OR   Elevated liver enzymes    GERD (gastroesophageal reflux disease)    OCC   Hypertension     SURGICAL HISTORY: Past Surgical  History:  Procedure Laterality Date   CHONDROPLASTY Right 03/05/2018   Procedure: CHONDROPLASTY;  Surgeon: Signa Kell, MD;  Location: [facility] ORS;  Service: Orthopedics;  Laterality: Right;   KNEE ARTHROSCOPY WITH MENISCAL REPAIR Right 03/05/2018   Procedure: KNEE ARTHROSCOPY WITH MENISCAL REPAIR;  Surgeon: Signa Kell, MD;  Location: [facility] ORS;  Service: Orthopedics;  Laterality: Right;   KNEE SURGERY Bilateral 2003, 2013   ACL REPAIR   MOUTH SURGERY     PROSTATE BIOPSY N/A 09/02/2021   Procedure: PROSTATE BIOPSY [name];  Surgeon: Orson Ape, MD;  Location: [facility] ORS;  Service: Urology;  Laterality: N/A;   WISDOM TOOTH EXTRACTION      SOCIAL HISTORY: Social History   Socioeconomic History   Marital status: Married    Spouse name: Not on file   Number of children: Not on file   Years of education: Not on file   Highest education level: Not on file  Occupational History   Not on file  Tobacco Use   Smoking status: Some Days    Types: Cigars   Smokeless tobacco: Never  Vaping Use   Vaping status: Never Used  Substance and Sexual Activity   Alcohol use: Yes    Comment: BEER AND WINE OCC-STOPPED HARD LIQOUR MARCH 2018   Drug use: Not Currently    Types: Marijuana    Comment: only in the vape   Sexual activity: Not on file  Other Topics Concern   Not on file  Social History Narrative   Not on file   Social Determinants of Health   Financial Resource Strain: Low Risk  (09/04/2017)   Received from [facility], [facility]   Overall Financial Resource Strain (CARDIA)    Difficulty of Paying Living Expenses: Not hard at all  Food Insecurity: No Food Insecurity (07/26/2023)   Hunger Vital Sign    Worried About Running Out of Food in the Last Year: Never true    Ran Out of Food in the Last Year: Never true  Transportation Needs: No Transportation Needs (07/26/2023)   PRAPARE - Administrator, Civil Service (Medical): No     Lack of Transportation (Non-Medical): No  Physical Activity: Sufficiently Active (09/04/2017)   Received from [facility], [facility]   Exercise Vital Sign    Days of Exercise per Week: 7 days  Minutes of Exercise per Session: 30 min  Stress: No Stress Concern Present (09/04/2017)   Received from [facility], [facility]   [employer] of Occupational Health - Occupational Stress Questionnaire    Feeling of Stress : Only a little  Social Connections: Moderately Integrated (09/04/2017)   Received from [facility], [facility]   Social Connection and Isolation Panel [NHANES]    Frequency of Communication with Friends and Family: More than three times a week    Frequency of Social Gatherings with Friends and Family: More than three times a week    Attends Religious Services: More than 4 times per year    Active Member of [employer] or Organizations: Yes    Attends Engineer, structural: More than 4 times per year    Marital Status: Divorced  Intimate Partner Violence: Not At Risk (07/26/2023)   Humiliation, Afraid, Rape, and Kick questionnaire    Fear of Current or Ex-Partner: No    Emotionally Abused: No    Physically Abused: No    Sexually Abused: No    FAMILY HISTORY: Family History  Problem Relation Age of Onset   Kidney disease Father     ALLERGIES:  is allergic to doxycycline.  MEDICATIONS:  Current Outpatient Medications  Medication Sig Dispense Refill   acetaminophen (TYLENOL) 500 MG tablet Take 1,000 mg by mouth every 6 (six) hours as needed for moderate pain.     albuterol (PROVENTIL HFA;VENTOLIN HFA) 108 (90 Base) MCG/ACT inhaler Inhale 2 puffs into the lungs every 6 (six) hours as needed for wheezing or shortness of breath. 1 Inhaler 0   amLODipine (NORVASC) 10 MG tablet Take 10 mg by mouth every morning.     benzonatate (TESSALON PERLES) 100 MG  capsule Take 1 capsule (100 mg total) by mouth 3 (three) times daily as needed for cough. 30 capsule 0   Cyanocobalamin (B-12 PO) Take 1 tablet by mouth daily.     Esomeprazole Magnesium 20 MG TBEC Take 20 mg by mouth daily as needed (acid reflux).     folic acid (FOLVITE) 1 MG tablet Take 1 tablet (1 mg total) by mouth daily. 360 tablet 0   Multiple Vitamins-Minerals (MULTIVITAMIN WITH MINERALS) tablet Take 1 tablet by mouth daily.     omeprazole (PRILOSEC OTC) 20 MG tablet Take 1 tablet (20 mg total) by mouth daily. 90 tablet 0   ondansetron (ZOFRAN-ODT) 4 MG disintegrating tablet Take 1 tablet (4 mg total) by mouth every 8 (eight) hours as needed for nausea or vomiting. 30 tablet 0   sildenafil (VIAGRA) 100 MG tablet Take 100 mg by mouth daily as needed for erectile dysfunction.      thiamine (VITAMIN B1) 100 MG tablet Take 1 tablet (100 mg total) by mouth daily. 30 tablet 11   VITAMIN D PO Take 1 tablet by mouth daily.     No current facility-administered medications for this visit.    Review of Systems  Constitutional:  Negative for appetite change, chills, fatigue, fever and unexpected weight change.  HENT:   Negative for hearing loss and voice change.   Eyes:  Negative for eye problems and icterus.  Respiratory:  Negative for chest tightness, cough and shortness of breath.   Cardiovascular:  Negative for chest pain and leg swelling.  Gastrointestinal:  Negative for abdominal distention and abdominal pain.  Endocrine: Negative for hot flashes.  Genitourinary:  Negative for difficulty urinating, dysuria and frequency.  Musculoskeletal:  Negative for arthralgias.  Skin:  Negative for itching and rash.  Neurological:  Negative for light-headedness and numbness.  Hematological:  Negative for adenopathy. Does not bruise/bleed easily.  Psychiatric/Behavioral:  Negative for confusion.     PHYSICAL EXAMINATION: ECOG PERFORMANCE STATUS: 1 - Symptomatic but completely ambulatory Vitals:    07/26/23 1127  BP: 107/86  Pulse: (!) 105  Resp: 18  Temp: 97.6 F (36.4 C)   Filed Weights   07/26/23 1127  Weight: 132 lb 9.6 oz (60.1 kg)    Physical Exam Constitutional:      General: He is not in acute distress. HENT:     Head: Normocephalic and atraumatic.  Eyes:     General: No scleral icterus. Cardiovascular:     Rate and Rhythm: Normal rate and regular rhythm.     Heart sounds: Normal heart sounds.  Pulmonary:     Effort: Pulmonary effort is normal. No respiratory distress.     Breath sounds: No wheezing.  Abdominal:     General: Bowel sounds are normal. There is no distension.     Palpations: Abdomen is soft.  Musculoskeletal:        General: No deformity. Normal range of motion.     Cervical back: Normal range of motion and neck supple.  Skin:    General: Skin is warm and dry.     Findings: No erythema or rash.  Neurological:     Mental Status: He is alert and oriented to person, place, and time. Mental status is at baseline.     Cranial Nerves: No cranial nerve deficit.     Coordination: Coordination normal.  Psychiatric:        Mood and Affect: Mood normal.      LABORATORY DATA:  I have reviewed the data as listed    Latest Ref Rng & Units 07/26/2023   11:51 AM 07/14/2023   12:42 PM 10/22/2021    8:40 AM  CBC  WBC 4.0 - 10.5 K/uL 15.2  9.9  6.2   Hemoglobin 13.0 - 17.0 g/dL 41.3  24.4  01.0   Hematocrit 39.0 - 52.0 % 35.2  36.3  37.2   Platelets 150 - 400 K/uL 470  142  211       Latest Ref Rng & Units 07/26/2023   11:51 AM 07/14/2023   12:42 PM 10/22/2021    8:40 AM  CMP  Glucose 70 - 99 mg/dL  272  536   BUN 6 - 20 mg/dL  15  8   Creatinine 6.44 - 1.24 mg/dL  0.34  7.42   Sodium 595 - 145 mmol/L  136  131   Potassium 3.5 - 5.1 mmol/L  3.7  3.3   Chloride 98 - 111 mmol/L  96  97   CO2 22 - 32 mmol/L  23  24   Calcium 8.9 - 10.3 mg/dL  9.5  9.1   Total Protein 6.5 - 8.1 g/dL 7.1   6.7   Total Bilirubin 0.3 - 1.2 mg/dL 0.6   0.9    Alkaline Phos 38 - 126 U/L 71   126   AST 15 - 41 U/L 108   217   ALT 0 - 44 U/L 121   88      RADIOGRAPHIC STUDIES: I have personally reviewed the radiological images as listed and agreed with the findings in the report.  DG Chest 2 View  Result Date: 07/14/2023 CLINICAL DATA:  Cough. Hemoptysis. Intermittent shortness  of breath. EXAM: CHEST - 2 VIEW COMPARISON:  02/15/2021 FINDINGS: Unchanged cardiac silhouette and mediastinal contours. No focal parenchymal opacities. No pleural effusion or pneumothorax. No evidence of edema. No acute osseous abnormalities. IMPRESSION: No acute cardiopulmonary disease. Specifically, no evidence of pneumonia. Electronically Signed   By: Simonne Come M.D.   On: 07/14/2023 12:46   MR HIP RIGHT WO CONTRAST  Result Date: 07/06/2023 CLINICAL DATA:  Right hip pain EXAM: MR OF THE RIGHT HIP WITHOUT CONTRAST TECHNIQUE: Multiplanar, multisequence MR imaging was performed. No intravenous contrast was administered. COMPARISON:  None Available. FINDINGS: Bones: No acute fracture. No dislocation. No femoral head avascular necrosis. Bony pelvis intact without diastasis. No significant arthropathy of the contralateral left hip. SI joints and pubic symphysis within normal limits. No bone marrow edema. No marrow replacing bone lesion. Articular cartilage and labrum Articular cartilage: No focal cartilage defect. No subchondral marrow signal changes. Labrum: Anterosuperior labrum appears torn (series 8, images 12-13), evaluation of which is somewhat limited in the absence of intra-articular contrast. No paralabral cyst. Joint or bursal effusion Joint effusion:  Trace right hip joint effusion. Bursae: Mild bilateral peritrochanteric bursal edema. 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 The hamstring, iliopsoas, rectus femoris, and adductor tendons appear intact without tear or significant tendinosis. Grade 2  muscle tear at the left iliopsoas myotendinous junction (series 6, image 19). Otherwise normal muscle bulk and signal intensity without edema, atrophy, or fatty infiltration. Other findings Miscellaneous: No soft tissue edema or fluid collection. No inguinal lymphadenopathy. Large amount of stool within the rectal vault. IMPRESSION: 1. Anterosuperior labral tear of the right hip. 2. 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 3. Grade 2 muscle tear at the LEFT iliopsoas myotendinous junction, likely acute or subacute. Electronically Signed   By: Duanne Guess D.O.   On: 07/06/2023 10:02   MR LUMBAR SPINE WO CONTRAST  Result Date: 06/07/2023 CLINICAL DATA:  Left-sided low back pain. Bilateral sciatica. Changes in bowel and bladder. EXAM: MRI LUMBAR SPINE WITHOUT CONTRAST TECHNIQUE: Multiplanar, multisequence MR imaging of the lumbar spine was performed. No intravenous contrast was administered. COMPARISON:  None Available. FINDINGS: Segmentation:  Standard. Alignment:  Physiologic. Vertebrae: No fracture, evidence of discitis, or bone lesion. L5 limbus vertebra the anterior superior corner. Conus medullaris and cauda equina: Conus extends to the L1 level. Conus and cauda equina appear normal. Paraspinal and other soft tissues: Unremarkable Disc levels: Diffusely preserved disc height and hydration. No herniation or facet spurring. IMPRESSION: No specific cause for symptoms. No degeneration or impingement noted throughout the lumbar spine. Electronically Signed   By: Tiburcio Pea M.D.   On: 06/07/2023 08:03

## 2023-07-26 NOTE — Assessment & Plan Note (Addendum)
New onset, mild thrombocytopenia.  For the work up of patient's thrombocytopenia, I recommend checking CBC;CMP, LDH; smear review, folate, Vitamin B12, hepatitis, HIV, flowcytometry Check US abdomen

## 2023-07-26 NOTE — Assessment & Plan Note (Signed)
Recommend alcohol cessation. 

## 2023-07-26 NOTE — Assessment & Plan Note (Signed)
Likely due to alcohol use.  Check US abdomen

## 2023-07-27 ENCOUNTER — Encounter: Payer: BC Managed Care – PPO | Admitting: Oncology

## 2023-07-27 ENCOUNTER — Other Ambulatory Visit: Payer: BC Managed Care – PPO

## 2023-07-28 ENCOUNTER — Other Ambulatory Visit: Payer: Self-pay

## 2023-07-28 ENCOUNTER — Encounter
Admission: RE | Admit: 2023-07-28 | Discharge: 2023-07-28 | Disposition: A | Discharge status: Home / Self Care | Payer: BC Managed Care – PPO | Source: Ambulatory Visit | Attending: Orthopedic Surgery | Admitting: Orthopedic Surgery

## 2023-07-28 NOTE — Patient Instructions (Addendum)
Your procedure is scheduled on: Tuesday 08/01/23 To find out your arrival time, please call [phone] between 1PM - 3PM on:   Monday 07/31/23 Report to the Registration Desk on the 1st floor of the [facility]. FREE Valet parking is available.  If your arrival time is 6:00 am, do not arrive before that time as the [facility] entrance doors do not open until 6:00 am.  REMEMBER: Instructions that are not followed completely may result in serious medical risk, up to and including death; or upon the discretion of your surgeon and anesthesiologist your surgery may need to be rescheduled.  Do not eat food after midnight the night before surgery.  No gum chewing or hard candies.  You may however, drink CLEAR liquids up to 2 hours before you are scheduled to arrive for your surgery. Do not drink anything within 2 hours of your scheduled arrival time.  Clear liquids include: - water  - apple juice without pulp - gatorade (not RED colors) - black coffee or tea (Do NOT add milk or creamers to the coffee or tea) Do NOT drink anything that is not on this list.  Type 1 and Type 2 diabetics should only drink water.  One week prior to surgery: Stop Anti-inflammatories (NSAIDS) such as Advil, Aleve, Ibuprofen, Motrin, Naproxen, Naprosyn and Aspirin based products such as Excedrin, Goody's Powder, BC Powder. You may however, continue to take Tylenol if needed for pain up until the day of surgery.  Stop ANY OVER THE COUNTER supplements or vitamins until after surgery.  Continue taking all prescribed medications.   TAKE ONLY THESE MEDICATIONS THE MORNING OF SURGERY WITH A SIP OF WATER:  amLODipine (NORVASC) 10 MG tablet   Use inhalers on the day of surgery and bring to the hospital.  No Alcohol for 24 hours before or after surgery.  No Smoking including e-cigarettes for 24 hours before surgery.  No chewable tobacco products for at least 6 hours before surgery.  No nicotine patches on the  day of surgery.  Do not use any "recreational" drugs for at least a week (preferably 2 weeks) before your surgery.  Please be advised that the combination of cocaine and anesthesia may have negative outcomes, up to and including death. If you test positive for cocaine, your surgery will be cancelled.  On the morning of surgery brush your teeth with toothpaste and water, you may rinse your mouth with mouthwash if you wish. Do not swallow any toothpaste or mouthwash.  Shower prior to arrival for surgery..  Do not wear lotions, powders, or perfumes.   Do not shave body hair from the neck down 48 hours before surgery. You may shave facial hair  Wear comfortable clothing (specific to your surgery type) to the hospital.  Do not wear jewelry, make-up, hairpins, clips or nail polish.  For welded (permanent) jewelry: bracelets, anklets, waist bands, etc.  Please have this removed prior to surgery.  If it is not removed, there is a chance that hospital personnel will need to cut it off on the day of surgery. Contact lenses, hearing aids and dentures may not be worn into surgery.  Do not bring valuables to the hospital. [facility] is not responsible for any missing/lost belongings or valuables.   Notify your doctor if there is any change in your medical condition (cold, fever, infection).  If you are being discharged the day of surgery, you will not be allowed to drive home. You will need a responsible individual  to drive you home and stay with you for 24 hours after surgery.   If you are taking public transportation, you will need to have a responsible individual with you.  If you are being admitted to the hospital overnight, leave your suitcase in the car. After surgery it may be brought to your room.  In case of increased patient census, it may be necessary for you, the patient, to continue your postoperative care in the Same Day Surgery department.  After surgery, you can help prevent  lung complications by doing breathing exercises.  Take deep breaths and cough every 1-2 hours. Your doctor may order a device called an Incentive Spirometer to help you take deep breaths. When coughing or sneezing, hold a pillow firmly against your incision with both hands. This is called "splinting." Doing this helps protect your incision. It also decreases belly discomfort.  Surgery Visitation Policy:  Patients undergoing a surgery or procedure may have two family members or support persons with them as long as the person is not COVID-19 positive or experiencing its symptoms.   Inpatient Visitation:    Visiting hours are 7 a.m. to 8 p.m. Up to four visitors are allowed at one time in a patient room. The visitors may rotate out with other people during the day. One designated support person (adult) may remain overnight.  Please call the Pre-admissions Testing Dept. at [phone] if you have any questions about these instructions.

## 2023-07-31 LAB — COMP PANEL: LEUKEMIA/LYMPHOMA

## 2023-07-31 MED ORDER — LACTATED RINGERS IV SOLN: INTRAVENOUS | Status: DC

## 2023-07-31 MED ORDER — CHLORHEXIDINE GLUCONATE 0.12 % MT SOLN
15.0000 mL | Freq: Once | OROMUCOSAL | Status: AC
  Administered 2023-08-01: 15 mL via OROMUCOSAL

## 2023-07-31 MED ORDER — ORAL CARE MOUTH RINSE: 15.0000 mL | Freq: Once | OROMUCOSAL | Status: AC

## 2023-07-31 MED ORDER — CEFAZOLIN SODIUM-DEXTROSE 2-4 GM/100ML-% IV SOLN
2.0000 g | INTRAVENOUS | Status: AC
  Administered 2023-08-01: 2 g via INTRAVENOUS

## 2023-07-31 MED ORDER — FAMOTIDINE 20 MG PO TABS
20.0000 mg | ORAL_TABLET | Freq: Once | ORAL | Status: AC
  Administered 2023-08-01: 20 mg via ORAL

## 2023-08-01 ENCOUNTER — Ambulatory Visit: Payer: BC Managed Care – PPO

## 2023-08-01 ENCOUNTER — Other Ambulatory Visit: Payer: Self-pay

## 2023-08-01 ENCOUNTER — Encounter
Admission: RE | Disposition: A | Discharge status: Home / Self Care | Payer: Self-pay | Source: Home / Self Care | Attending: Orthopedic Surgery

## 2023-08-01 ENCOUNTER — Ambulatory Visit: Payer: BC Managed Care – PPO | Admitting: Certified Registered"

## 2023-08-01 ENCOUNTER — Ambulatory Visit
Admission: RE | Admit: 2023-08-01 | Discharge: 2023-08-01 | Disposition: A | Discharge status: Home / Self Care | Payer: BC Managed Care – PPO | Attending: Orthopedic Surgery | Admitting: Orthopedic Surgery

## 2023-08-01 ENCOUNTER — Encounter: Payer: Self-pay | Admitting: Orthopedic Surgery

## 2023-08-01 ENCOUNTER — Encounter: Payer: BC Managed Care – PPO | Admitting: Oncology

## 2023-08-01 DIAGNOSIS — Z79899 Other long term (current) drug therapy: Secondary | ICD-10-CM | POA: Diagnosis not present

## 2023-08-01 DIAGNOSIS — F17210 Nicotine dependence, cigarettes, uncomplicated: Secondary | ICD-10-CM | POA: Diagnosis not present

## 2023-08-01 DIAGNOSIS — M7062 Trochanteric bursitis, left hip: Secondary | ICD-10-CM | POA: Diagnosis present

## 2023-08-01 DIAGNOSIS — I10 Essential (primary) hypertension: Secondary | ICD-10-CM | POA: Insufficient documentation

## 2023-08-01 DIAGNOSIS — K219 Gastro-esophageal reflux disease without esophagitis: Secondary | ICD-10-CM | POA: Insufficient documentation

## 2023-08-01 SURGERY — RELEASE, ILIOTIBIAL BAND, ARTHROSCOPIC
Anesthesia: Spinal | Laterality: Left

## 2023-08-01 MED ORDER — ACETAMINOPHEN 500 MG PO TABS
1000.0000 mg | ORAL_TABLET | Freq: Three times a day (TID) | ORAL | 2 refills | Status: DC
Start: 2023-08-01 — End: 2024-05-15

## 2023-08-01 MED ORDER — KETAMINE HCL 50 MG/5ML IJ SOSY
PREFILLED_SYRINGE | INTRAMUSCULAR | Status: AC
  Filled 2023-08-01: qty 5

## 2023-08-01 MED ORDER — FAMOTIDINE 20 MG PO TABS
ORAL_TABLET | ORAL | Status: AC
  Filled 2023-08-01: qty 1

## 2023-08-01 MED ORDER — PROPOFOL 500 MG/50ML IV EMUL
INTRAVENOUS | Status: DC | PRN
  Administered 2023-08-01: 150 ug/kg/min via INTRAVENOUS

## 2023-08-01 MED ORDER — OXYCODONE HCL 5 MG/5ML PO SOLN: 5.0000 mg | Freq: Once | ORAL | Status: DC | PRN

## 2023-08-01 MED ORDER — ONDANSETRON 4 MG PO TBDP: 4.0000 mg | ORAL_TABLET | Freq: Three times a day (TID) | ORAL | 0 refills | Status: DC | PRN

## 2023-08-01 MED ORDER — KETAMINE HCL 50 MG/5ML IJ SOSY
PREFILLED_SYRINGE | INTRAMUSCULAR | Status: DC | PRN
Start: 2023-08-01 — End: 2023-08-01
  Administered 2023-08-01 (×3): 10 mg via INTRAVENOUS

## 2023-08-01 MED ORDER — ONDANSETRON HCL 4 MG/2ML IJ SOLN
INTRAMUSCULAR | Status: DC | PRN
  Administered 2023-08-01: 4 mg via INTRAVENOUS

## 2023-08-01 MED ORDER — LACTATED RINGERS IV SOLN: INTRAVENOUS | Status: DC | PRN

## 2023-08-01 MED ORDER — CEFAZOLIN SODIUM-DEXTROSE 2-4 GM/100ML-% IV SOLN
INTRAVENOUS | Status: AC
  Filled 2023-08-01: qty 100

## 2023-08-01 MED ORDER — MIDAZOLAM HCL 2 MG/2ML IJ SOLN
INTRAMUSCULAR | Status: DC | PRN
  Administered 2023-08-01: 2 mg via INTRAVENOUS

## 2023-08-01 MED ORDER — CHLORHEXIDINE GLUCONATE 0.12 % MT SOLN
OROMUCOSAL | Status: AC
  Filled 2023-08-01: qty 15

## 2023-08-01 MED ORDER — ASPIRIN 325 MG PO TBEC: 325.0000 mg | DELAYED_RELEASE_TABLET | Freq: Every day | ORAL | 0 refills | Status: AC

## 2023-08-01 MED ORDER — FENTANYL CITRATE (PF) 100 MCG/2ML IJ SOLN
INTRAMUSCULAR | Status: DC | PRN
  Administered 2023-08-01: 50 ug via INTRAVENOUS
  Administered 2023-08-01 (×2): 25 ug via INTRAVENOUS

## 2023-08-01 MED ORDER — EPINEPHRINE PF 1 MG/ML IJ SOLN
INTRAMUSCULAR | Status: AC
  Filled 2023-08-01: qty 4

## 2023-08-01 MED ORDER — DIPHENHYDRAMINE HCL 25 MG PO CAPS
25.0000 mg | ORAL_CAPSULE | Freq: Four times a day (QID) | ORAL | Status: DC | PRN
  Administered 2023-08-01: 25 mg via ORAL

## 2023-08-01 MED ORDER — PROPOFOL 10 MG/ML IV BOLUS
INTRAVENOUS | Status: AC
  Filled 2023-08-01: qty 20

## 2023-08-01 MED ORDER — DEXAMETHASONE SODIUM PHOSPHATE 10 MG/ML IJ SOLN
INTRAMUSCULAR | Status: DC | PRN
  Administered 2023-08-01: 10 mg via INTRAVENOUS

## 2023-08-01 MED ORDER — PHENYLEPHRINE HCL (PRESSORS) 10 MG/ML IV SOLN
INTRAVENOUS | Status: DC | PRN
Start: 2023-08-01 — End: 2023-08-01
  Administered 2023-08-01: 80 ug via INTRAVENOUS

## 2023-08-01 MED ORDER — TRAMADOL HCL 50 MG PO TABS
50.0000 mg | ORAL_TABLET | ORAL | 0 refills | Status: DC | PRN
Start: 2023-08-01 — End: 2024-05-15

## 2023-08-01 MED ORDER — MIDAZOLAM HCL 2 MG/2ML IJ SOLN
INTRAMUSCULAR | Status: AC
  Filled 2023-08-01: qty 2

## 2023-08-01 MED ORDER — DIPHENHYDRAMINE HCL 25 MG PO CAPS
ORAL_CAPSULE | ORAL | Status: AC
  Filled 2023-08-01: qty 1

## 2023-08-01 MED ORDER — BUPIVACAINE HCL (PF) 0.5 % IJ SOLN
INTRAMUSCULAR | Status: AC
  Filled 2023-08-01: qty 30

## 2023-08-01 MED ORDER — FENTANYL CITRATE (PF) 100 MCG/2ML IJ SOLN: 25.0000 ug | INTRAMUSCULAR | Status: DC | PRN

## 2023-08-01 MED ORDER — BUPIVACAINE HCL (PF) 0.5 % IJ SOLN
INTRAMUSCULAR | Status: DC | PRN
Start: 2023-08-01 — End: 2023-08-01
  Administered 2023-08-01: 3 mL

## 2023-08-01 MED ORDER — OXYCODONE HCL 5 MG PO TABS: 5.0000 mg | ORAL_TABLET | Freq: Once | ORAL | Status: DC | PRN

## 2023-08-01 MED ORDER — LIDOCAINE-EPINEPHRINE (PF) 1 %-1:200000 IJ SOLN
INTRAMUSCULAR | Status: DC | PRN
  Administered 2023-08-01: 10 mL

## 2023-08-01 MED ORDER — LACTATED RINGERS IR SOLN
Status: DC | PRN
  Administered 2023-08-01: 12004 mL

## 2023-08-01 MED ORDER — LIDOCAINE-EPINEPHRINE 1 %-1:100000 IJ SOLN
INTRAMUSCULAR | Status: AC
  Filled 2023-08-01: qty 1

## 2023-08-01 MED ORDER — FENTANYL CITRATE (PF) 100 MCG/2ML IJ SOLN
INTRAMUSCULAR | Status: AC
  Filled 2023-08-01: qty 2

## 2023-08-01 SURGICAL SUPPLY — 44 items
ADAPTER IRRIG TUBE 2 SPIKE SOL (ADAPTER) ×2 IMPLANT
ADPR TBG 2 SPK PMP STRL ASCP (ADAPTER) ×2
APL PRP STRL LF DISP 70% ISPRP (MISCELLANEOUS) ×1
BLADE FULL RADIUS 3.5 (BLADE) IMPLANT
BLADE SHAVER 4.5X7 STR FR (MISCELLANEOUS) IMPLANT
BUR BR 5.5 WIDE MOUTH (BURR) IMPLANT
CANNULA TWIST IN 8.25X7CM (CANNULA) ×1 IMPLANT
CHLORAPREP W/TINT 26 (MISCELLANEOUS) ×1 IMPLANT
COOLER POLAR GLACIER W/PUMP (MISCELLANEOUS) IMPLANT
DRAPE C-ARM XRAY 36X54 (DRAPES) ×1 IMPLANT
DRAPE IMP U-DRAPE 54X76 (DRAPES) ×1 IMPLANT
DRAPE SURG 17X11 SM STRL (DRAPES) ×2 IMPLANT
DRAPE U-SHAPE 47X51 STRL (DRAPES) ×2 IMPLANT
GAUZE SPONGE 4X4 12PLY STRL (GAUZE/BANDAGES/DRESSINGS) ×1 IMPLANT
GAUZE XEROFORM 1X8 LF (GAUZE/BANDAGES/DRESSINGS) ×1 IMPLANT
GLOVE BIO SURGEON STRL SZ7.5 (GLOVE) ×2 IMPLANT
GLOVE BIO SURGEON STRL SZ8 (GLOVE) ×2 IMPLANT
GLOVE BIOGEL PI IND STRL 8 (GLOVE) ×1 IMPLANT
GLOVE INDICATOR 8.0 STRL GRN (GLOVE) ×1 IMPLANT
GLOVE SURG ORTHO 8.0 STRL STRW (GLOVE) ×1 IMPLANT
GLOVE SURG SYN 7.5 E (GLOVE) ×1 IMPLANT
GLOVE SURG SYN 7.5 PF PI (GLOVE) ×1 IMPLANT
GOWN STRL REUS W/ TWL LRG LVL3 (GOWN DISPOSABLE) ×1 IMPLANT
GOWN STRL REUS W/ TWL XL LVL3 (GOWN DISPOSABLE) ×1 IMPLANT
GOWN STRL REUS W/TWL LRG LVL3 (GOWN DISPOSABLE) ×1
GOWN STRL REUS W/TWL XL LVL3 (GOWN DISPOSABLE) ×1
IV NS IRRIG 3000ML ARTHROMATIC (IV SOLUTION) ×4 IMPLANT
KIT TURNOVER KIT A (KITS) ×1 IMPLANT
MANIFOLD NEPTUNE II (INSTRUMENTS) ×1 IMPLANT
MAT ABSORB FLUID 56X50 GRAY (MISCELLANEOUS) ×1 IMPLANT
NDL HYPO 22X1.5 SAFETY MO (MISCELLANEOUS) ×1 IMPLANT
NEEDLE HYPO 22X1.5 SAFETY MO (MISCELLANEOUS) ×1 IMPLANT
PACK ARTHROSCOPY SHOULDER (MISCELLANEOUS) ×1 IMPLANT
PAD ABD DERMACEA PRESS 5X9 (GAUZE/BANDAGES/DRESSINGS) ×1 IMPLANT
PAD WRAPON POLAR SHDR UNIV (MISCELLANEOUS) IMPLANT
SPONGE T-LAP 18X18 ~~LOC~~+RFID (SPONGE) ×1 IMPLANT
SUT ETHILON 3-0 FS-10 30 BLK (SUTURE) ×1
SUT VIC AB 2-0 CT2 27 (SUTURE) ×1 IMPLANT
SUTURE EHLN 3-0 FS-10 30 BLK (SUTURE) ×1 IMPLANT
SYR 10ML LL (SYRINGE) ×1 IMPLANT
SYR 20ML LL LF (SYRINGE) ×1 IMPLANT
TUBE SET DOUBLEFLO INFLOW (TUBING) ×1 IMPLANT
TUBE SET DOUBLEFLO OUTFLOW (TUBING) ×1 IMPLANT
WRAPON POLAR PAD SHDR UNIV (MISCELLANEOUS) ×1

## 2023-08-01 NOTE — Discharge Instructions (Addendum)
Endoscopic Trochanteric Bursectomy   Post-Op Instructions   1. Bracing or crutches: Crutches will be provided at the time of discharge from the surgery center if you do not already have them. You can use a walker if you prefer.    2. Ice: You may be provided with a device [facility]) that allows you to ice the affected area effectively. Otherwise you can ice manually.    3. Driving:  Plan on not driving for at least one week. Please note that you are advised NOT to drive while taking narcotic pain medications as you may be impaired and unsafe to drive.   4. Activity: Ankle pumps several times an hour while awake to prevent blood clots. Weight bearing: as tolerated. Use crutches/walker (for 1-2 weeks) until pain allows you to ambulate without a limp. Avoid standing more than 5 minutes (consecutively) for the first week.  Avoid long distance travel for 2 weeks.   5. Medications:  - You have been provided a prescription for narcotic pain medicine. After surgery, take 1 tablet every 4 hours if needed for severe pain.  - You may take up to 3000mg /day of tylenol (acetaminophen). You can take 1000mg  3x/day. Please check your narcotic. If you have acetaminophen in your narcotic (each tablet will be 325mg ), be careful not to exceed a total of 3000mg /day of acetaminophen.  - A prescription for anti-nausea medication will be provided in case the narcotic medicine causes nausea - take 1 tablet every 6 hours only if nauseated.  - Take enteric coated aspirin 325 mg once daily for 2 weeks to prevent blood clots.   6. Bandages: You may remove the bandages after 3 days. Drainage from the bandages (clear/reddish) can frequently occur. If this does occur, you may remove the dressing and apply another sterile dressing. You can shower after bandages are removed.    7. Physical Therapy: not needed   8. Work: May return to full work usually around 2 weeks after 1st post-operative visit. May do light duty/desk job in  approximately 1-2 weeks when off of narcotics, pain is well-controlled, and swelling has decreased. Labor intensive jobs may require 4-6 weeks to return.      9. Post-Op Appointments: Your first post-op appointment will be in approximately 2 weeks time.    If you find that they have not been scheduled please call the Orthopaedic Appointment front desk at [phone].   AMBULATORY SURGERY  DISCHARGE INSTRUCTIONS   The drugs that you were given will stay in your system until tomorrow so for the next 24 hours you should not:  Drive an automobile Make any legal decisions Drink any alcoholic beverage   You may resume regular meals tomorrow.  Today it is better to start with liquids and gradually work up to solid foods.  You may eat anything you prefer, but it is better to start with liquids, then soup and crackers, and gradually work up to solid foods.   Please notify your doctor immediately if you have any unusual bleeding, trouble breathing, redness and pain at the surgery site, drainage, fever, or pain not relieved by medication.    Additional Instructions:   POLAR CARE INFORMATION  MassAdvertisement.it  How to use [employer] Polar Care [facility] Therapy System?  YouTube   [email or web address].co.[email or web address]  OPERATING INSTRUCTIONS  Start the product With dry hands, connect the transformer to the electrical connection located on the top of the cooler. Next, plug the transformer into an appropriate electrical outlet. The unit will automatically  start running at this point.  To stop the pump, disconnect electrical power.  Unplug to stop the product when not in use. Unplugging the Polar Care unit turns it off. Always unplug immediately after use. Never leave it plugged in while unattended. Remove pad.    FIRST ADD WATER TO FILL LINE, THEN ICE---Replace ice when existing ice is almost melted  1 Discuss Treatment with your Licensed Health Care Practitioner and Use Only as  Prescribed 2 Apply Insulation Barrier & Cold Therapy Pad 3 Check for Moisture 4 Inspect Skin Regularly  Tips and Trouble Shooting Usage Tips 1. Use cubed or chunked ice for optimal performance. 2. It is recommended to drain the Pad between uses. To drain the pad, hold the Pad upright with the hose pointed toward the ground. Depress the black plunger and allow water to drain out. 3. You may disconnect the Pad from the unit without removing the pad from the affected area by depressing the silver tabs on the hose coupling and gently pulling the hoses apart. The Pad and unit will seal itself and will not leak. Note: Some dripping during release is normal. 4. DO NOT RUN PUMP WITHOUT WATER! The pump in this unit is designed to run with water. Running the unit without water will cause permanent damage to the pump. 5. Unplug unit before removing lid.  TROUBLESHOOTING GUIDE Pump not running, Water not flowing to the pad, Pad is not getting cold 1. Make sure the transformer is plugged into the wall outlet. 2. Confirm that the ice and water are filled to the indicated levels. 3. Make sure there are no kinks in the pad. 4. Gently pull on the blue tube to make sure the tube/pad junction is straight. 5. Remove the pad from the treatment site and ll it while the pad is lying at; then reapply. 6. Confirm that the pad couplings are securely attached to the unit. Listen for the double clicks (Figure 1) to confirm the pad couplings are securely attached.  Leaks    Note: Some condensation on the lines, controller, and pads is unavoidable, especially in warmer climates. 1. If using a B[employer] Polar Care Cold Therapy unit with a detachable Cold Therapy Pad, and a leak exists (other than condensation on the lines) disconnect the pad couplings. Make sure the silver tabs on the couplings are depressed before reconnecting the pad to the pump hose; then confirm both sides of the coupling are properly clicked in. 2. If the  coupling continues to leak or a leak is detected in the pad itself, stop using it and call [employer] Customer Care at [phone].  Cleaning After use, empty and dry the unit with a soft cloth. Warm water and mild detergent may be used occasionally to clean the pump and tubes.  WARNING: The Polar Care Cube can be cold enough to cause serious injury, including full skin necrosis. Follow these Operating Instructions, and carefully read the Product Insert (see pouch on side of unit) and the Cold Therapy Pad Fitting Instructions (provided with each Cold Therapy Pad) prior to use.

## 2023-08-01 NOTE — Op Note (Signed)
DATE OF SURGERY:  08/01/2023   PREOPERATIVE DIAGNOSIS:  1. Left hip trochanteric bursitis 2. Left hip iliotibial band tightness   POSTOPERATIVE DIAGNOSIS:  1. Left hip trochanteric bursitis 2. Left hip iliotibial band tightness   PROCEDURE:  1. Left hip endoscopic trochanteric bursectomy 2. Left hip endoscopic iliotibial band release  SURGEON: Rosealee Albee, MD  ASSISTANT: Sonny Dandy, PA    EBL: 5cc  ANESTHESIA: Gen  IMPLANTS: none  INDICATION(S): The patient is a 42 y.o. male who presents with persistent lateral sided hip pain. The MRI revealed significant trochanteric bursitis without significant hip abductor tearing.  The patient has failed all non-operative care to date including multiple corticosteroid injections, physical therapy/exercises, medications, and activity modification.  Please see the preoperative notes for further detail.   The patient elected to undergo the above mentioned procedure after detailed explanation of the expected outcomes and recovery path and after discussion of risks, benefits, and alternatives to surgery  OPERATIVE FINDINGS:  Significant trochanteric bursitis. Intact gluteus medius/minimus tendon insertion.  OPERATIVE REPORT:   [name] was brought to the operating room and underwent anesthesia. The patient was placed in a supine fashion on the Hana table.  The operative extremity was flexed approximately 10 degrees and abducted approximately 30 degrees.  The foot was internally rotated. All bony prominences were padded.  Appropriate IV antibiotics were administered.  The patient was prepped and draped in a sterile fashion.  Time-out was performed and landmarks were identified with fluoroscopic assistance.  Needle localization with fluoroscopy was used to make an anterior portal.  A standard anterolateral portal at the level of the tip of the greater trochanter was made.  The blunt trocar was inserted deep to the IT band and along the  greater trochanter.  The peritrochanteric space was opened with a blunt trocar.  Appropriate positioning was confirmed with fluoroscopy.  A 70 degree knee arthroscope was used for this procedure, and it was inserted.  Next a distal anterolateral portal was established approximately 6 cm distal to the anterior portal just anterior to the IT band and greater trochanter. This was also done under needle localization to ensure appropriate trajectory.  A switching stick was inserted and appropriate positioning was confirmed with fluoroscopy. A shaver was introduced.  Using combination of oscillating shaver and electrocautery wand, the significant amount of trochanteric bursa was excised.  After excision and debridement of the bursa, the gluteal sling, vastus lateralis, and gluteus medius/minimus insertion were well visualized.  There was no significant gluteus medius tear upon probing the insertion.  The leg was then placed in an adducted position.  The most prominent portion of the greater trochanter was adjacent to the IT band.  The IT band was then released via a cruciate type incision using an ArthroCare wand to reduce friction and irritation in this region of prominence.  Arthroscopic fluid was then evacuated from the joint.    Local anesthetic was injected.  Portal sites were closed with 2-0 Vicryl and 3-0 nylon sutures.  Sterile dressing was applied.  The patient was awakened from anesthesia without difficulty and transferred to PACU in stable condition.    POSTOPERATIVE PLAN:  FFWB x 1 week, WBAT with walker/crutches for 2nd week.  Can wean from assistive device afterwards.  ASA 325 mg daily x2 weeks for DVT prophylaxis.  Follow-up in approximately 2 weeks for postoperative visit.

## 2023-08-01 NOTE — H&P (Signed)
Paper H&P to be scanned into permanent record. H&P reviewed. No significant changes noted.  

## 2023-08-01 NOTE — Anesthesia Postprocedure Evaluation (Deleted)
Anesthesia Post Note  Patient: CRISTOPHER CICCARELLI  Procedure(s) Performed: Left hip endoscopic trochanteric bursectomy, IT band release (Left)  Patient location during evaluation: PACU Anesthesia Type: Spinal Level of consciousness: awake and alert Pain management: pain level controlled Vital Signs Assessment: post-procedure vital signs reviewed and stable Respiratory status: spontaneous breathing, nonlabored ventilation, respiratory function stable and patient connected to nasal cannula oxygen Cardiovascular status: blood pressure returned to baseline and stable Postop Assessment: no apparent nausea or vomiting Anesthetic complications: no   There were no known notable events for this encounter.   Last Vitals:  Vitals:   08/01/23 0915 08/01/23 0944  BP: (!) 122/94 (!) 164/86  Pulse: 87 65  Resp: 14 16  Temp: (!) 36.4 C 36.5 C  SpO2: 100% 100%    Last Pain:  Vitals:   08/01/23 0944  TempSrc: Temporal  PainSc: 0-No pain                 Louie Boston

## 2023-08-01 NOTE — Anesthesia Preprocedure Evaluation (Addendum)
Anesthesia Evaluation  Patient identified by MRN, date of birth, ID band Patient awake    Reviewed: Allergy & Precautions, NPO status , Patient's Chart, lab work & pertinent test results  History of Anesthesia Complications Negative for: history of anesthetic complications  Airway Mallampati: I  TM Distance: >3 FB Neck ROM: full    Dental no notable dental hx.    Pulmonary asthma , Current Smoker   Pulmonary exam normal        Cardiovascular hypertension, On Medications Normal cardiovascular exam     Neuro/Psych  PSYCHIATRIC DISORDERS Anxiety     negative neurological ROS     GI/Hepatic Neg liver ROS,GERD  Controlled and Medicated,,  Endo/Other  negative endocrine ROS    Renal/GU      Musculoskeletal  (+) Arthritis ,    Abdominal   Peds  Hematology  (+) Blood dyscrasia, anemia   Anesthesia Other Findings Past Medical History: No date: Alcohol abuse No date: Anemia No date: Anxiety No date: Arthritis No date: Asthma     Comment:  EXERCISE INDUCED-NO INHALERS No date: Complication of anesthesia     Comment:  PT STATES HE GETS REALLY ANXIOUS WITH ANESTHESIA  AND               HAS TO BE GIVEN SOMETHING PRIOR TO GOING BACK TO OR No date: Elevated liver enzymes No date: GERD (gastroesophageal reflux disease)     Comment:  OCC No date: Hypertension  Past Surgical History: 03/05/2018: CHONDROPLASTY; Right     Comment:  Procedure: CHONDROPLASTY;  Surgeon: Signa Kell, MD;                Location: [facility] ORS;  Service: Orthopedics;  Laterality:               Right; No date: EYE SURGERY 03/05/2018: KNEE ARTHROSCOPY WITH MENISCAL REPAIR; Right     Comment:  Procedure: KNEE ARTHROSCOPY WITH MENISCAL REPAIR;                Surgeon: Signa Kell, MD;  Location: [facility] ORS;  Service:              Orthopedics;  Laterality: Right; 2003, 2013: KNEE SURGERY; Bilateral     Comment:  ACL REPAIR No date: MOUTH SURGERY      Comment:  dental implant 09/02/2021: PROSTATE BIOPSY; N/A     Comment:  Procedure: PROSTATE BIOPSY URONAV;  Surgeon: Orson Ape, MD;  Location: [facility] ORS;  Service: Urology;                Laterality: N/A; No date: WISDOM TOOTH EXTRACTION     Reproductive/Obstetrics negative OB ROS                             Anesthesia Physical Anesthesia Plan  ASA: 2  Anesthesia Plan: Spinal   Post-op Pain Management: Regional block*, Toradol IV (intra-op)* and Ofirmev IV (intra-op)*   Induction: Intravenous  PONV Risk Score and Plan: 1 and Propofol infusion, TIVA and Treatment may vary due to age or medical condition  Airway Management Planned: Natural Airway and Nasal Cannula  Additional Equipment:   Intra-op Plan:   Post-operative Plan:   Informed Consent: I have reviewed the patients History and Physical, chart, labs and discussed the procedure including the risks, benefits and alternatives for the proposed anesthesia with the  patient or authorized representative who has indicated his/her understanding and acceptance.     Dental Advisory Given  Plan Discussed with: Anesthesiologist, CRNA and Surgeon  Anesthesia Plan Comments: (Patient reports no bleeding problems and no anticoagulant use.  Plan for spinal with backup GA  Patient consented for risks of anesthesia including but not limited to:  - adverse reactions to medications - damage to eyes, teeth, lips or other oral mucosa - nerve damage due to positioning  - risk of bleeding, infection and or nerve damage from spinal that could lead to paralysis - risk of headache or failed spinal - damage to teeth, lips or other oral mucosa - sore throat or hoarseness - damage to heart, brain, nerves, lungs, other parts of body or loss of life  Patient voiced understanding and assent.)       Anesthesia Quick Evaluation

## 2023-08-01 NOTE — Transfer of Care (Signed)
Immediate Anesthesia Transfer of Care Note  Patient: Russell Hansen  Procedure(s) Performed: Left hip endoscopic trochanteric bursectomy, IT band release (Left)  Patient Location: PACU  Anesthesia Type:General  Level of Consciousness: awake, alert , and oriented  Airway & Oxygen Therapy: Patient Spontanous Breathing  Post-op Assessment: Report given to RN and Post -op Vital signs reviewed and stable  Post vital signs: stable  Last Vitals:  Vitals Value Taken Time  BP 97/77 08/01/23 0853  Temp    Pulse 93 08/01/23 0855  Resp 11 08/01/23 0855  SpO2 100 % 08/01/23 0855  Vitals shown include unfiled device data.  Last Pain:  Vitals:   08/01/23 0618  TempSrc: Temporal  PainSc: 6          Complications: No notable events documented.

## 2023-08-01 NOTE — Anesthesia Procedure Notes (Signed)
Spinal  Patient location during procedure: OR Start time: 08/01/2023 7:36 AM End time: 08/01/2023 7:42 AM Reason for block: surgical anesthesia Staffing Anesthesiologist: Louie Boston, MD Resident/CRNA: Maryla Morrow., CRNA Performed by: Maryla Morrow., CRNA Authorized by: Louie Boston, MD   Preanesthetic Checklist Completed: patient identified, IV checked, site marked, risks and benefits discussed, surgical consent, monitors and equipment checked, pre-op evaluation and timeout performed Spinal Block Patient position: sitting Prep: Betadine Patient monitoring: heart rate, continuous pulse ox, blood pressure and cardiac monitor Approach: midline Location: L4-5 Injection technique: single-shot Needle Needle type: Whitacre and Introducer  Needle gauge: 24 G Needle length: 9 cm Assessment Events: CSF return Additional Notes Negative paresthesia. Negative blood return. Positive free-flowing CSF. Expiration date of kit checked and confirmed. Patient tolerated procedure well, without complications.

## 2023-08-02 ENCOUNTER — Ambulatory Visit: Payer: BC Managed Care – PPO

## 2023-08-03 NOTE — Anesthesia Postprocedure Evaluation (Signed)
Anesthesia Post Note  Patient: Russell Hansen  Procedure(s) Performed: Left hip endoscopic trochanteric bursectomy, IT band release (Left)  Patient location during evaluation: PACU Anesthesia Type: Spinal Level of consciousness: awake and alert Pain management: pain level controlled Vital Signs Assessment: post-procedure vital signs reviewed and stable Respiratory status: spontaneous breathing and respiratory function stable Cardiovascular status: blood pressure returned to baseline and stable Postop Assessment: spinal receding Anesthetic complications: no   There were no known notable events for this encounter.   Last Vitals:  Vitals:   08/01/23 0944 08/01/23 1042  BP: (!) 164/86 (!) 132/94  Pulse: 65 78  Resp: 16 16  Temp: 36.5 C   SpO2: 100% 100%    Last Pain:  Vitals:   08/02/23 0821  TempSrc:   PainSc: 0-No pain                 Louie Boston

## 2023-08-11 ENCOUNTER — Ambulatory Visit
Admission: RE | Admit: 2023-08-11 | Discharge: 2023-08-11 | Disposition: A | Discharge status: Home / Self Care | Payer: BC Managed Care – PPO | Source: Ambulatory Visit | Attending: Oncology

## 2023-08-11 DIAGNOSIS — D696 Thrombocytopenia, unspecified: Secondary | ICD-10-CM | POA: Insufficient documentation

## 2023-08-23 ENCOUNTER — Inpatient Hospital Stay: Payer: BC Managed Care – PPO | Admitting: Oncology

## 2023-08-23 ENCOUNTER — Encounter: Payer: Self-pay | Admitting: Oncology

## 2023-08-23 DIAGNOSIS — D696 Thrombocytopenia, unspecified: Secondary | ICD-10-CM

## 2023-08-23 DIAGNOSIS — Z789 Other specified health status: Secondary | ICD-10-CM

## 2023-08-23 DIAGNOSIS — N289 Disorder of kidney and ureter, unspecified: Secondary | ICD-10-CM

## 2023-08-23 DIAGNOSIS — K760 Fatty (change of) liver, not elsewhere classified: Secondary | ICD-10-CM

## 2023-08-23 DIAGNOSIS — R7989 Other specified abnormal findings of blood chemistry: Secondary | ICD-10-CM | POA: Diagnosis not present

## 2023-08-23 DIAGNOSIS — R972 Elevated prostate specific antigen [PSA]: Secondary | ICD-10-CM

## 2023-08-23 NOTE — Progress Notes (Addendum)
HEMATOLOGY-ONCOLOGY TeleHEALTH VISIT PROGRESS NOTE  I connected with Russell Hansen on 08/23/23  at  3:30 PM EDT by video enabled telemedicine visit and verified that I am speaking with the correct person using two identifiers. I discussed the limitations, risks, security and privacy concerns of performing an evaluation and management service by telemedicine and the availability of in-person appointments. The patient expressed understanding and agreed to proceed.   Other persons participating in the visit and their role in the encounter:  None  Patient's location: Home  Provider's location: office Chief Complaint: Thrombocytopenia   INTERVAL HISTORY Russell Hansen is a 42 y.o. male who has above history reviewed by me today presents for follow up visit for management of Thrombocytopenia He presents to discuss results.   Review of Systems  Constitutional:  Negative for appetite change, chills, fatigue, fever and unexpected weight change.  HENT:   Negative for hearing loss and voice change.   Eyes:  Negative for eye problems and icterus.  Respiratory:  Negative for chest tightness, cough and shortness of breath.   Cardiovascular:  Negative for chest pain and leg swelling.  Gastrointestinal:  Negative for abdominal distention and abdominal pain.  Endocrine: Negative for hot flashes.  Genitourinary:  Negative for difficulty urinating, dysuria and frequency.   Musculoskeletal:  Negative for arthralgias.  Skin:  Negative for itching and rash.  Neurological:  Negative for light-headedness and numbness.  Hematological:  Negative for adenopathy. Does not bruise/bleed easily.  Psychiatric/Behavioral:  Negative for confusion.     Past Medical History:  Diagnosis Date   Alcohol abuse    Anemia    Anxiety    Arthritis    Asthma    EXERCISE INDUCED-NO INHALERS   Complication of anesthesia    PT STATES HE GETS REALLY ANXIOUS WITH ANESTHESIA  AND HAS TO BE GIVEN SOMETHING PRIOR TO  GOING BACK TO OR   Elevated liver enzymes    GERD (gastroesophageal reflux disease)    OCC   Hypertension    Past Surgical History:  Procedure Laterality Date   CHONDROPLASTY Right 03/05/2018   Procedure: CHONDROPLASTY;  Surgeon: Signa Kell, MD;  Location: [facility];  Service: Orthopedics;  Laterality: Right;   EYE SURGERY     KNEE ARTHROSCOPY WITH MENISCAL REPAIR Right 03/05/2018   Procedure: KNEE ARTHROSCOPY WITH MENISCAL REPAIR;  Surgeon: Signa Kell, MD;  Location: [facility];  Service: Orthopedics;  Laterality: Right;   KNEE SURGERY Bilateral 2003, 2013   ACL REPAIR   MOUTH SURGERY     dental implant   PROSTATE BIOPSY N/A 09/02/2021   Procedure: PROSTATE BIOPSY [name];  Surgeon: Orson Ape, MD;  Location: [facility];  Service: Urology;  Laterality: N/A;   WISDOM TOOTH EXTRACTION      Family History  Problem Relation Age of Onset   Kidney disease Father     Social History   Socioeconomic History   Marital status: Married    Spouse name: Not on file   Number of children: Not on file   Years of education: Not on file   Highest education level: Not on file  Occupational History   Not on file  Tobacco Use   Smoking status: Some Days    Types: Cigars   Smokeless tobacco: Never  Vaping Use   Vaping status: Never Used  Substance and Sexual Activity   Alcohol use: Yes    Alcohol/week: 5.0 standard drinks of alcohol    Types: 5 Shots of liquor per week  Comment: BEER AND WINE OCC-STOPPED HARD LIQOUR MARCH 2018   Drug use: Not Currently    Types: Marijuana    Comment: only in the vape   Sexual activity: Not on file  Other Topics Concern   Not on file  Social History Narrative   Not on file   Social Determinants of Health   Financial Resource Strain: Low Risk  (09/04/2017)   Received from [facility], [facility]   Overall Financial Resource Strain (CARDIA)    Difficulty of Paying Living Expenses: Not hard at all  Food  Insecurity: No Food Insecurity (07/26/2023)   Hunger Vital Sign    Worried About Running Out of Food in the Last Year: Never true    Ran Out of Food in the Last Year: Never true  Transportation Needs: No Transportation Needs (07/26/2023)   PRAPARE - Administrator, Civil Service (Medical): No    Lack of Transportation (Non-Medical): No  Physical Activity: Sufficiently Active (09/04/2017)   Received from [facility], [facility]   Exercise Vital Sign    Days of Exercise per Week: 7 days    Minutes of Exercise per Session: 30 min  Stress: No Stress Concern Present (09/04/2017)   Received from [facility], [facility]   [employer] of Occupational Health - Occupational Stress Questionnaire    Feeling of Stress : Only a little  Social Connections: Moderately Integrated (09/04/2017)   Received from [facility], [facility]   Social Connection and Isolation Panel [NHANES]    Frequency of Communication with Friends and Family: More than three times a week    Frequency of Social Gatherings with Friends and Family: More than three times a week    Attends Religious Services: More than 4 times per year    Active Member of [employer] or Organizations: Yes    Attends Engineer, structural: More than 4 times per year    Marital Status: Divorced  Intimate Partner Violence: Not At Risk (07/26/2023)   Humiliation, Afraid, Rape, and Kick questionnaire    Fear of Current or Ex-Partner: No    Emotionally Abused: No    Physically Abused: No    Sexually Abused: No    Current Outpatient Medications on File Prior to Visit  Medication Sig Dispense Refill   acetaminophen (TYLENOL) 500 MG tablet Take 1,000 mg by mouth every 6 (six) hours as needed for moderate pain.     acetaminophen (TYLENOL) 500 MG tablet Take 2 tablets (1,000 mg total) by mouth every 8 (eight) hours. 90 tablet 2    albuterol (PROVENTIL HFA;VENTOLIN HFA) 108 (90 Base) MCG/ACT inhaler Inhale 2 puffs into the lungs every 6 (six) hours as needed for wheezing or shortness of breath. 1 Inhaler 0   amLODipine (NORVASC) 10 MG tablet Take 10 mg by mouth every morning.     benzonatate (TESSALON PERLES) 100 MG capsule Take 1 capsule (100 mg total) by mouth 3 (three) times daily as needed for cough. 30 capsule 0   Cyanocobalamin (B-12 PO) Take 1 tablet by mouth daily.     Esomeprazole Magnesium 20 MG TBEC Take 20 mg by mouth daily as needed (acid reflux).     folic acid (FOLVITE) 1 MG tablet Take 1 tablet (1 mg total) by mouth daily. 360 tablet 0   Multiple Vitamins-Minerals (MULTIVITAMIN WITH MINERALS) tablet Take 1 tablet by mouth daily.  ondansetron (ZOFRAN-ODT) 4 MG disintegrating tablet Take 1 tablet (4 mg total) by mouth every 8 (eight) hours as needed for nausea or vomiting. 30 tablet 0   ondansetron (ZOFRAN-ODT) 4 MG disintegrating tablet Take 1 tablet (4 mg total) by mouth every 8 (eight) hours as needed for nausea or vomiting. 20 tablet 0   sildenafil (VIAGRA) 100 MG tablet Take 100 mg by mouth daily as needed for erectile dysfunction.      VITAMIN D PO Take 1 tablet by mouth daily.     omeprazole (PRILOSEC OTC) 20 MG tablet Take 1 tablet (20 mg total) by mouth daily. (Patient not taking: Reported on 07/28/2023) 90 tablet 0   thiamine (VITAMIN B1) 100 MG tablet Take 1 tablet (100 mg total) by mouth daily. (Patient not taking: Reported on 07/28/2023) 30 tablet 11   traMADol (ULTRAM) 50 MG tablet Take 1 tablet (50 mg total) by mouth every 4 (four) hours as needed. (Patient not taking: Reported on 08/23/2023) 20 tablet 0   No current facility-administered medications on file prior to visit.    Allergies  Allergen Reactions   Doxycycline Itching   Norco [Hydrocodone-Acetaminophen]        Observations/Objective: There were no vitals filed for this visit. There is no height or weight on file to calculate  BMI.  Physical Exam Neurological:     Mental Status: He is alert.       Latest Ref Rng & Units 07/26/2023   11:51 AM 07/14/2023   12:42 PM 10/22/2021    8:40 AM  CBC  WBC 4.0 - 10.5 K/uL 15.2  9.9  6.2   Hemoglobin 13.0 - 17.0 g/dL 62.8  31.5  17.6   Hematocrit 39.0 - 52.0 % 35.2  36.3  37.2   Platelets 150 - 400 K/uL 470  142  211       Latest Ref Rng & Units 07/26/2023   11:51 AM 07/14/2023   12:42 PM 10/22/2021    8:40 AM  CMP  Glucose 70 - 99 mg/dL  160  737   BUN 6 - 20 mg/dL  15  8   Creatinine 1.06 - 1.24 mg/dL  2.69  4.85   Sodium 462 - 145 mmol/L  136  131   Potassium 3.5 - 5.1 mmol/L  3.7  3.3   Chloride 98 - 111 mmol/L  96  97   CO2 22 - 32 mmol/L  23  24   Calcium 8.9 - 10.3 mg/dL  9.5  9.1   Total Protein 6.5 - 8.1 g/dL 7.1   6.7   Total Bilirubin 0.3 - 1.2 mg/dL 0.6   0.9   Alkaline Phos 38 - 126 U/L 71   126   AST 15 - 41 U/L 108   217   ALT 0 - 44 U/L 121   88       ASSESSMENT & PLAN:   Thrombocytopenia (HCC) New onset, mild thrombocytopenia, resolved. He has mild thrombocytosis, likely reactive  Work up showed normal folate, normal Vitamin B12, negative hepatitis, negative HIV, negative flowcytometry   Alcohol use Recommend alcohol cessation.   Elevated LFTs Likely due to alcohol use.  US showed fatty liver disease. Refer to GI   Kidney lesion Likely complex cysts.  Recommend MRI abdomen w wo contrast for further evaluation  Patient also wants to establish care with urology for history of elevated PSA. Previous urologist retired. Refer to urology  Orders Placed This Encounter  Procedures   MR  Abdomen W Wo Contrast    Standing Status:   Future    Standing Expiration Date:   08/22/2024    Order Specific Question:   If indicated for the ordered procedure, I authorize the administration of contrast media per Radiology protocol    Answer:   Yes    Order Specific Question:   What is the patient's sedation requirement?    Answer:   No Sedation     Order Specific Question:   Does the patient have a pacemaker or implanted devices?    Answer:   No    Order Specific Question:   Preferred imaging location?    Answer:   [facility] (table limit - 550lbs)   CBC with Differential (Cancer Center Only)    Standing Status:   Future    Standing Expiration Date:   08/22/2024   Ambulatory referral to Urology    Referral Priority:   Routine    Referral Type:   Consultation    Referral Reason:   Specialty Services Required    Requested Specialty:   Urology    Number of Visits Requested:   1   Ambulatory referral to Gastroenterology    Referral Priority:   Routine    Referral Type:   Consultation    Referral Reason:   Specialty Services Required    Number of Visits Requested:   1   Repeat lab in 2 months.   Rickard Patience, MD 08/23/2023 10:57 PM

## 2023-08-23 NOTE — Assessment & Plan Note (Signed)
Likely due to alcohol use.  US showed fatty liver disease. Refer to GI

## 2023-08-23 NOTE — Assessment & Plan Note (Signed)
New onset, mild thrombocytopenia, resolved. He has mild thrombocytosis, likely reactive  Work up showed normal folate, normal Vitamin B12, negative hepatitis, negative HIV, negative flowcytometry

## 2023-08-23 NOTE — Assessment & Plan Note (Signed)
Recommend alcohol cessation. 

## 2023-08-23 NOTE — Assessment & Plan Note (Addendum)
Likely complex cysts.  Recommend MRI abdomen w wo contrast for further evaluation  Patient also wants to establish care with urology for history of elevated PSA. Previous urologist retired. Refer to urology

## 2023-08-24 ENCOUNTER — Telehealth: Payer: Self-pay | Admitting: Oncology

## 2023-08-24 NOTE — Telephone Encounter (Signed)
Spoke with pt about scheduling his MRI. Pt had it scheduled for 11/6 and he has decided to cancel it and wait to do the MRI until after urology.

## 2023-08-30 ENCOUNTER — Ambulatory Visit: Payer: BC Managed Care – PPO

## 2023-10-05 ENCOUNTER — Ambulatory Visit: Payer: BC Managed Care – PPO | Admitting: Urology

## 2023-10-23 ENCOUNTER — Inpatient Hospital Stay: Payer: BC Managed Care – PPO | Attending: Oncology

## 2023-11-08 ENCOUNTER — Encounter: Payer: Self-pay | Admitting: Urology

## 2023-11-08 ENCOUNTER — Ambulatory Visit: Payer: 59 | Admitting: Urology

## 2023-11-08 VITALS — BP 114/79 | HR 110 | Ht 68.0 in | Wt 140.0 lb

## 2023-11-08 DIAGNOSIS — Z87898 Personal history of other specified conditions: Secondary | ICD-10-CM

## 2023-11-08 DIAGNOSIS — N2889 Other specified disorders of kidney and ureter: Secondary | ICD-10-CM

## 2023-11-08 LAB — MICROSCOPIC EXAMINATION

## 2023-11-08 LAB — URINALYSIS, COMPLETE
Bilirubin, UA: NEGATIVE
Glucose, UA: NEGATIVE
Leukocytes,UA: NEGATIVE
Nitrite, UA: NEGATIVE
RBC, UA: NEGATIVE
Specific Gravity, UA: >1.03 — ABNORMAL HIGH (ref 1.005–1.030)
Urobilinogen, Ur: 1 mg/dL (ref 0.2–1.0)
pH, UA: 5.5 (ref 5.0–7.5)

## 2023-11-08 NOTE — Progress Notes (Signed)
 I, [name], acting as a scribe for Russell Knapp, MD., have documented all relevant documentation on the behalf of Russell Knapp, MD,as directed by Russell Knapp, MD while in the presence of Russell Knapp, MD.  11/08/2023 11:48 AM   Russell Hansen 1981-06-05 [id number]  Referring provider: Timmy Forbes, MD [address] [place],  [place] [address]  Chief Complaint  Patient presents with   Elevated PSA    HPI: Russell Hansen is a 43 y.o. male referred for evaluation of a left renal mass. He also has history of an elevated PSA.   Saw Dr. Wilhelmenia Harada for thrombocytopenia and abdominal ultrasound performed 08/11/23 showed a 1 x 1 x 1.3 cm hypochoic mass in the midpole of the left kidney, which was considered indeterminate and a renal mass protocol MRI was recommended. The MRI has been scheduled for 12/15/2023. He has previously seen Dr. Fredrick Jenkins for an elevated PSA of 5.0 on 07/26/2021. A prostate MRI was performed 08/04/2021 which showed a prostate volume of 22 cc's. He had a PI-RADS 3 lesion on the left PZ and a PI-RADS 3 lesion the right PZ.  He underwent MR fusion biopsy by Dr. Fredrick Jenkins 09/02/2021 with standard template and ROI biopsies showing benign prostate tissue. He had a total of 20 cores obtained with a standard 12-core template and 4 biopsies each ROI. He has no bothersome LUTS.  He has erectile dysfunction on sildenafil, which is effective.    PMH: Past Medical History:  Diagnosis Date   Alcohol abuse    Anemia    Anxiety    Arthritis    Asthma    EXERCISE INDUCED-NO INHALERS   Complication of anesthesia    PT STATES HE GETS REALLY ANXIOUS WITH ANESTHESIA  AND HAS TO BE GIVEN SOMETHING PRIOR TO GOING BACK TO OR   Elevated liver enzymes    GERD (gastroesophageal reflux disease)    OCC   Hypertension     Surgical History: Past Surgical History:  Procedure Laterality Date   CHONDROPLASTY Right 03/05/2018   Procedure: CHONDROPLASTY;  Surgeon: Lorri Rota, MD;  Location: [facility];  Service: Orthopedics;  Laterality: Right;   EYE SURGERY     KNEE ARTHROSCOPY WITH MENISCAL REPAIR Right 03/05/2018   Procedure: KNEE ARTHROSCOPY WITH MENISCAL REPAIR;  Surgeon: Lorri Rota, MD;  Location: [facility];  Service: Orthopedics;  Laterality: Right;   KNEE SURGERY Bilateral 2003, 2013   ACL REPAIR   MOUTH SURGERY     dental implant   PROSTATE BIOPSY N/A 09/02/2021   Procedure: PROSTATE BIOPSY Ali Ink;  Surgeon: Rea Cambridge, MD;  Location: [facility];  Service: Urology;  Laterality: N/A;   WISDOM TOOTH EXTRACTION      Home Medications:  Allergies as of 11/08/2023       Reactions   Doxycycline Itching   Norco [hydrocodone -acetaminophen ]         Medication List        Accurate as of November 08, 2023 11:48 AM. If you have any questions, ask your nurse or doctor.          STOP taking these medications    omeprazole  20 MG tablet Commonly known as: PriLOSEC  OTC Stopped by: Russell Hansen       TAKE these medications    acetaminophen  500 MG tablet Commonly known as: TYLENOL  Take 1,000 mg by mouth every 6 (six) hours as needed for moderate pain.   acetaminophen  500 MG tablet  Commonly known as: TYLENOL  Take 2 tablets (1,000 mg total) by mouth every 8 (eight) hours.   albuterol  108 (90 Base) MCG/ACT inhaler Commonly known as: VENTOLIN  HFA Inhale 2 puffs into the lungs every 6 (six) hours as needed for wheezing or shortness of breath.   amLODipine 10 MG tablet Commonly known as: NORVASC Take 10 mg by mouth every morning.   B-12 PO Take 1 tablet by mouth daily.   benzonatate  100 MG capsule Commonly known as: Tessalon  Perles Take 1 capsule (100 mg total) by mouth 3 (three) times daily as needed for cough.   Esomeprazole Magnesium  20 MG Tbec Take 20 mg by mouth daily as needed (acid reflux).   folic acid  1 MG tablet Commonly known as: FOLVITE  Take 1 tablet (1 mg total) by mouth daily.   multivitamin with minerals  tablet Take 1 tablet by mouth daily.   ondansetron  4 MG disintegrating tablet Commonly known as: ZOFRAN -ODT Take 1 tablet (4 mg total) by mouth every 8 (eight) hours as needed for nausea or vomiting.   ondansetron  4 MG disintegrating tablet Commonly known as: ZOFRAN -ODT Take 1 tablet (4 mg total) by mouth every 8 (eight) hours as needed for nausea or vomiting.   sildenafil 100 MG tablet Commonly known as: VIAGRA Take 100 mg by mouth daily as needed for erectile dysfunction.   thiamine  100 MG tablet Commonly known as: VITAMIN B1 Take 1 tablet (100 mg total) by mouth daily.   traMADol  50 MG tablet Commonly known as: Ultram  Take 1 tablet (50 mg total) by mouth every 4 (four) hours as needed.   VITAMIN D PO Take 1 tablet by mouth daily.        Allergies:  Allergies  Allergen Reactions   Doxycycline Itching   Norco [Hydrocodone -Acetaminophen ]     Family History: Family History  Problem Relation Age of Onset   Kidney disease Father     Social History:  reports that he has been smoking cigars. He has never used smokeless tobacco. He reports current alcohol use of about 5.0 standard drinks of alcohol per week. He reports that he does not currently use drugs after having used the following drugs: Marijuana.   Physical Exam: BP 114/79   Pulse (!) 110   Ht 5\' 8"  (1.727 m)   Wt 140 lb (63.5 kg)   BMI 21.29 kg/m   Constitutional:  Alert and oriented, No acute distress. HEENT: [place] AT Respiratory: Normal respiratory effort, no increased work of breathing. GU: Prostate 25 grams, smooth without nodules.    Urinalysis Dipstick 1+ ketone/2+ protein,  microscopy negative    Assessment & Plan:    1. History of elevated PSA He states he saw Dr. Fredrick Jenkins last year before he retired, and his PSA was normal, though we do not have his records.  Repeat PSA today.  Annual follow-up of PSA stable  2. Left renal mass Renal mass protocol MRI scheduled next month.  I have  reviewed the above documentation for accuracy and completeness, and I agree with the above.   Russell Knapp, MD  [facility] [address] [place], [place] [address] [phone]

## 2023-11-09 LAB — PSA: Prostate Specific Ag, Serum: 3.3 ng/mL (ref 0.0–4.0)

## 2023-11-14 ENCOUNTER — Ambulatory Visit: Payer: BC Managed Care – PPO | Admitting: Gastroenterology

## 2023-11-15 ENCOUNTER — Ambulatory Visit: Admission: RE | Admit: 2023-11-15 | Payer: 59 | Source: Ambulatory Visit

## 2023-11-17 ENCOUNTER — Ambulatory Visit
Admission: RE | Admit: 2023-11-17 | Discharge: 2023-11-17 | Disposition: A | Discharge status: Home / Self Care | Payer: 59 | Source: Ambulatory Visit | Attending: Oncology | Admitting: Oncology

## 2023-11-17 DIAGNOSIS — N289 Disorder of kidney and ureter, unspecified: Secondary | ICD-10-CM | POA: Insufficient documentation

## 2023-11-17 MED ORDER — GADOBUTROL 1 MMOL/ML IV SOLN
6.0000 mL | Freq: Once | INTRAVENOUS | Status: AC | PRN
  Administered 2023-11-17: 6 mL via INTRAVENOUS

## 2023-11-25 ENCOUNTER — Encounter: Payer: Self-pay | Admitting: Oncology

## 2023-12-14 NOTE — Progress Notes (Deleted)
 Russell Amy, PA-C [address]  [address]  [place], [place] [address]  Main: [phone]  Fax: [phone]   Gastroenterology Consultation  Referring Provider:     Jerrilyn Cairo Primary Ca* Primary Care Physician:  Luciana Axe, NP Primary Gastroenterologist:  *** Reason for Consultation:     Elevated LFTs        HPI:   Russell Hansen is a 43 y.o. y/o male referred for consultation & management  by Luciana Axe, NP.    Referred to evaluate elevated LFTs.  History of fatty liver and alcohol abuse for many years.  History of thrombocytopenia and folic acid deficiency.  History of elevated LFTs from alcohol use since at least 2017.    Previously saw [facility] in [place] in 2017 and 2018 for elevated LFTs, alcohol dependence, and fatty liver.  Liver labs during that time showed HAV and HCV nonreactive.  Iron panel, ANA, AMA, ASMA, alpha-1-antitrypsin, ceruloplasmin WNL.     11/20/2023 labs: Elevated AST 823, ALT 129.  Normal total bilirubin and alk phos.  WBC 6.6, hemoglobin 13.2, MCV 104, platelets 220.  Normal vitamin B12 (928).  Normal folate. 07/2023: AST 108, ALT 121. 10/2022: AST 147, ALT 84. 07/2021: AST 541, ALT 109. 03/2020: AST 299, ALT 96. 05/2019: AST 238, ALT 117. 06/2017: AST 108, ALT 106. 07/2016: AST 498, ALT 181.  07/2023 complete abdominal ultrasound: Increased liver echotexture, hepatic steatosis.  No liver masses.  Normal gallbladder.  1.3 cm left kidney mass.  11/17/2023 abdominal MRI with and without contrast: Hepatomegaly at 19.2 cm.  Marked hepatic steatosis.  Possible mild cirrhosis.  No ascites.  No suspicious liver lesions.  Pancreas divisum or variant.  No acute pancreas inflammation.  Minimally complex 1.5 cm left kidney cyst, benign appearing.  Proximal gastric wall thickening due to underdistention or gastritis.  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   Vitamin D deficiency 09/04/2017    Past Surgical History:  Procedure Laterality Date   CHONDROPLASTY Right 03/05/2018   Procedure: CHONDROPLASTY;  Surgeon: Signa Kell, MD;  Location: [facility];  Service: Orthopedics;  Laterality: Right;   EYE SURGERY     KNEE ARTHROSCOPY WITH MENISCAL REPAIR Right 03/05/2018   Procedure: KNEE ARTHROSCOPY WITH MENISCAL REPAIR;  Surgeon: Signa Kell, MD;  Location: [facility];  Service: Orthopedics;  Laterality: Right;   KNEE SURGERY Bilateral 2003, 2013   ACL REPAIR   MOUTH SURGERY     dental implant   PROSTATE BIOPSY N/A 09/02/2021   Procedure: PROSTATE BIOPSY [name];  Surgeon: Orson Ape, MD;  Location: [facility];  Service: Urology;  Laterality: N/A;   WISDOM TOOTH EXTRACTION      Prior to Admission medications   Medication Sig Start Date End Date Taking? Authorizing Provider  acetaminophen (TYLENOL) 500 MG tablet Take 1,000 mg by mouth every 6 (six) hours as needed for moderate pain.  [provider]  acetaminophen (TYLENOL) 500 MG tablet Take 2 tablets (1,000 mg total) by mouth every 8 (eight) hours. 08/01/23 07/31/24  Signa Kell, MD  albuterol (PROVENTIL HFA;VENTOLIN HFA) 108 (90 Base) MCG/ACT inhaler Inhale 2 puffs into the lungs every 6 (six) hours as needed for wheezing or shortness of breath. 03/05/18   Signa Kell, MD  amLODipine (NORVASC) 10 MG tablet Take 10 mg by mouth every morning.    [provider]  benzonatate (TESSALON PERLES) 100 MG capsule Take 1 capsule (100 mg total) by mouth 3 (three) times  daily as needed for cough. 07/14/23 07/13/24  Corena Herter, MD  Cyanocobalamin (B-12 PO) Take 1 tablet by mouth daily.    [provider]  Esomeprazole Magnesium 20 MG TBEC Take 20 mg by mouth daily as needed (acid reflux).    [provider]  folic acid (FOLVITE) 1 MG tablet Take 1 tablet (1 mg total) by mouth daily. 07/14/23 07/13/24  Corena Herter, MD  Multiple Vitamins-Minerals (MULTIVITAMIN WITH MINERALS) tablet Take 1 tablet by mouth daily.    [provider]  ondansetron (ZOFRAN-ODT) 4 MG disintegrating tablet Take 1 tablet (4 mg total) by mouth every 8 (eight) hours as needed for nausea or vomiting. 07/14/23   Mumma, Carollee Herter, MD  ondansetron (ZOFRAN-ODT) 4 MG disintegrating tablet Take 1 tablet (4 mg total) by mouth every 8 (eight) hours as needed for nausea or vomiting. 08/01/23   Signa Kell, MD  sildenafil (VIAGRA) 100 MG tablet Take 100 mg by mouth daily as needed for erectile dysfunction.     [provider]  thiamine (VITAMIN B1) 100 MG tablet Take 1 tablet (100 mg total) by mouth daily. 07/14/23 07/13/24  Corena Herter, MD  traMADol (ULTRAM) 50 MG tablet Take 1 tablet (50 mg total) by mouth every 4 (four) hours as needed. 08/01/23 07/31/24  Signa Kell, MD  VITAMIN D PO Take 1 tablet by mouth daily.    [provider]    Family History  Problem Relation Age of Onset   Kidney disease Father      Social History   Tobacco Use   Smoking status: Some Days    Types: Cigars   Smokeless tobacco: Never  Vaping Use   Vaping status: Never Used  Substance Use Topics   Alcohol use: Yes    Alcohol/week: 5.0 standard drinks of alcohol    Types: 5 Shots of liquor per week    Comment: BEER AND WINE OCC-STOPPED HARD LIQOUR MARCH 2018   Drug use: Not Currently    Types: Marijuana    Comment: only in the vape    Allergies as of 12/15/2023 - Review Complete 11/17/2023  Allergen Reaction Noted   Doxycycline Itching 08/02/2016   Norco  [hydrocodone-acetaminophen]  07/28/2023    Review of Systems:    All systems reviewed and negative except where noted in HPI.   Physical Exam:  There were no vitals taken for this visit. No LMP for male patient.  General:   Alert,  Well-developed, well-nourished, pleasant and cooperative in NAD Lungs:  Respirations even and unlabored.  Clear throughout to auscultation.   No wheezes, crackles, or rhonchi. No acute distress. Heart:  Regular rate and rhythm; no murmurs, clicks, rubs, or gallops. Abdomen:  Normal bowel sounds.  No bruits.  Soft, and non-distended without masses, hepatosplenomegaly or hernias noted.  No Tenderness.  No guarding or rebound tenderness.    Neurologic:  Alert and oriented x3;  grossly normal neurologically.  Psych:  Alert and cooperative. Normal mood and affect.  Imaging Studies: MR Abdomen W Wo Contrast Result Date: 11/24/2023 CLINICAL DATA:  Left renal lesion on ultrasound performed for alcohol use thrombocytopenia. EXAM: MRI ABDOMEN WITHOUT AND WITH CONTRAST TECHNIQUE: Multiplanar multisequence MR imaging of the abdomen was performed both before and after the administration of intravenous contrast. CONTRAST:  6mL GADAVIST GADOBUTROL 1 MMOL/ML IV SOLN COMPARISON:  Ultrasound 08/11/2023. FINDINGS: Lower chest: Normal heart size without pericardial or pleural effusion. Hepatobiliary: Hepatomegaly at 19.2 cm craniocaudal. Marked hepatic steatosis. Mild caudate and lateral segment left liver lobe enlargement. No suspicious liver lesion or biliary abnormality. Pancreas: Pancreas divisum or variant, with dorsal duct entering the duodenum on 22/4. No duct dilatation or acute inflammation. Spleen:  Normal in size, without focal abnormality. Adrenals/Urinary Tract: Normal adrenal glands. Interpolar left renal exophytic 1.5 cm lesion including on 43/14 is consistent with a minimally complex cyst. No post-contrast enhancement, including on subtracted images. There is also a lower  pole left renal 5 mm simple cyst including on 53/14. No follow-up indicated. Normal right kidney.  No hydronephrosis. Stomach/Bowel: The proximal stomach is underdistended but appears thick walled. Normal large and small bowel loops. Vascular/Lymphatic: Aortic atherosclerosis. No retroperitoneal or retrocrural adenopathy. Other:  No ascites. Musculoskeletal: No acute osseous abnormality. IMPRESSION: 1. Minimally complex interpolar left renal 1.5 cm cyst likely corresponds to the ultrasound abnormality. No suspicious renal lesion identified. 2. Hepatomegaly and hepatic steatosis. Possible mild cirrhosis, especially given the clinical history of alcohol abuse and thrombocytopenia. 3. Apparent proximal gastric wall thickening could be due to underdistention or gastritis. 4. Pancreas divisum or variant. Electronically Signed   By: Jeronimo Greaves M.D.   On: 11/24/2023 10:45    Assessment and Plan:   DELSHON BLANCHFIELD is a 43 y.o. y/o male has been referred for:  1.  Elevated LFTs  2.  Alcohol abuse for many years (at least since 2017)  Recommend abstinence from alcohol.  Support programs were discussed including Alcoholics Anonymous and Risk manager.  3.  Hepatic steatosis with hepatomegaly  Recommend a low-fat diet, regular exercise, and weight loss. Patient education handout about fatty liver disease was given and discussed from up-to-date.   4.  Gastric wall thickening on abdominal MRI  Scheduling EGD I discussed risks of EGD with patient to include risk of bleeding, perforation, and risk of sedation.  Patient expressed understanding and agrees to proceed with EGD.   5.  Colon cancer screening  First colonoscopy will be due at age 58, August 2025 this year.  Follow up ***  Russell Amy, PA-C    BP check ***

## 2023-12-15 ENCOUNTER — Ambulatory Visit: Payer: BC Managed Care – PPO | Admitting: Physician Assistant

## 2023-12-31 ENCOUNTER — Other Ambulatory Visit: Payer: Self-pay

## 2023-12-31 ENCOUNTER — Emergency Department
Admission: EM | Admit: 2023-12-31 | Discharge: 2023-12-31 | Disposition: A | Discharge status: Home / Self Care | Attending: Emergency Medicine | Admitting: Emergency Medicine

## 2023-12-31 ENCOUNTER — Ambulatory Visit
Admission: EM | Admit: 2023-12-31 | Discharge: 2023-12-31 | Disposition: A | Discharge status: Home / Self Care | Attending: Emergency Medicine | Admitting: Emergency Medicine

## 2023-12-31 DIAGNOSIS — R7401 Elevation of levels of liver transaminase levels: Secondary | ICD-10-CM | POA: Diagnosis not present

## 2023-12-31 DIAGNOSIS — A084 Viral intestinal infection, unspecified: Secondary | ICD-10-CM

## 2023-12-31 DIAGNOSIS — R509 Fever, unspecified: Secondary | ICD-10-CM

## 2023-12-31 DIAGNOSIS — E876 Hypokalemia: Secondary | ICD-10-CM | POA: Diagnosis not present

## 2023-12-31 DIAGNOSIS — R197 Diarrhea, unspecified: Secondary | ICD-10-CM | POA: Insufficient documentation

## 2023-12-31 DIAGNOSIS — E872 Acidosis, unspecified: Secondary | ICD-10-CM | POA: Insufficient documentation

## 2023-12-31 DIAGNOSIS — R109 Unspecified abdominal pain: Secondary | ICD-10-CM | POA: Diagnosis not present

## 2023-12-31 DIAGNOSIS — R000 Tachycardia, unspecified: Secondary | ICD-10-CM | POA: Diagnosis not present

## 2023-12-31 DIAGNOSIS — R1110 Vomiting, unspecified: Secondary | ICD-10-CM | POA: Diagnosis present

## 2023-12-31 LAB — URINALYSIS, ROUTINE W REFLEX MICROSCOPIC
Bacteria, UA: NONE SEEN
Bilirubin Urine: NEGATIVE
Glucose, UA: NEGATIVE mg/dL
Hgb urine dipstick: NEGATIVE
Ketones, ur: NEGATIVE mg/dL
Leukocytes,Ua: NEGATIVE
Nitrite: NEGATIVE
Protein, ur: 100 mg/dL — AB
RBC / HPF: 0 RBC/hpf (ref 0–5)
Specific Gravity, Urine: 1.02 (ref 1.005–1.030)
pH: 5 (ref 5.0–8.0)

## 2023-12-31 LAB — POC COVID19/FLU A&B COMBO
Covid Antigen, POC: NEGATIVE
Influenza A Antigen, POC: NEGATIVE
Influenza B Antigen, POC: NEGATIVE

## 2023-12-31 LAB — COMPREHENSIVE METABOLIC PANEL
ALT: 54 U/L — ABNORMAL HIGH (ref 0–44)
AST: 189 U/L — ABNORMAL HIGH (ref 15–41)
Albumin: 3.9 g/dL (ref 3.5–5.0)
Alkaline Phosphatase: 162 U/L — ABNORMAL HIGH (ref 38–126)
Anion gap: 17 — ABNORMAL HIGH (ref 5–15)
BUN: 10 mg/dL (ref 6–20)
CO2: 18 mmol/L — ABNORMAL LOW (ref 22–32)
Calcium: 9.2 mg/dL (ref 8.9–10.3)
Chloride: 97 mmol/L — ABNORMAL LOW (ref 98–111)
Creatinine, Ser: 0.86 mg/dL (ref 0.61–1.24)
GFR, Estimated: >60 mL/min (ref >60)
Glucose, Bld: 105 mg/dL — ABNORMAL HIGH (ref 70–99)
Potassium: 3.3 mmol/L — ABNORMAL LOW (ref 3.5–5.1)
Sodium: 132 mmol/L — ABNORMAL LOW (ref 135–145)
Total Bilirubin: 1.1 mg/dL (ref 0.0–1.2)
Total Protein: 6.9 g/dL (ref 6.5–8.1)

## 2023-12-31 LAB — CBC
HCT: 37 % — ABNORMAL LOW (ref 39.0–52.0)
Hemoglobin: 13.6 g/dL (ref 13.0–17.0)
MCH: 36.2 pg — ABNORMAL HIGH (ref 26.0–34.0)
MCHC: 36.8 g/dL — ABNORMAL HIGH (ref 30.0–36.0)
MCV: 98.4 fL (ref 80.0–100.0)
Platelets: 181 10*3/uL (ref 150–400)
RBC: 3.76 MIL/uL — ABNORMAL LOW (ref 4.22–5.81)
RDW: 12.2 % (ref 11.5–15.5)
WBC: 9.2 10*3/uL (ref 4.0–10.5)
nRBC: 0 % (ref 0.0–0.2)

## 2023-12-31 LAB — LIPASE, BLOOD: Lipase: 26 U/L (ref 11–51)

## 2023-12-31 MED ORDER — SODIUM CHLORIDE 0.9 % IV BOLUS
1000.0000 mL | Freq: Once | INTRAVENOUS | Status: AC
  Administered 2023-12-31: 1000 mL via INTRAVENOUS

## 2023-12-31 MED ORDER — ONDANSETRON 4 MG PO TBDP: 4.0000 mg | ORAL_TABLET | Freq: Three times a day (TID) | ORAL | 0 refills | Status: DC | PRN

## 2023-12-31 MED ORDER — ONDANSETRON 4 MG PO TBDP
4.0000 mg | ORAL_TABLET | Freq: Once | ORAL | Status: AC
  Administered 2023-12-31: 4 mg via ORAL

## 2023-12-31 MED ORDER — ACETAMINOPHEN 325 MG PO TABS
650.0000 mg | ORAL_TABLET | Freq: Once | ORAL | Status: AC
  Administered 2023-12-31: 650 mg via ORAL

## 2023-12-31 NOTE — ED Triage Notes (Signed)
 Pt comes with c/o decreased appetite for 5 days. Pt states fever, chills and pain from throwing up. Pt went to UC and had swabs and all neg. Pt sent here for IV fluids. Pt also had fever and was given meds for that.

## 2023-12-31 NOTE — ED Triage Notes (Addendum)
 Patient to Urgent Care with complaints of fevers/ chills/ emesis/ diarrhea. Started feeling badly started Wednesday. Vomiting and fevers started Friday.   Was able to tolerate liquids yesterday but this morning unable to tolerate PO.  Reports he has Zofran 4mg  ODT at home which he attempted to take this morning but vomited after taking it.

## 2023-12-31 NOTE — ED Notes (Signed)
 Pt provided saltines and ginger ale; tolerated well. No n/v reported by pt.

## 2023-12-31 NOTE — ED Provider Notes (Signed)
 [facility] Provider Note    Event Date/Time   First MD Initiated Contact with Patient 12/31/23 1623     (approximate)   History   Abdominal Pain   HPI  Russell Hansen is a 43 year old male presenting to the emergency department for evaluation of vomiting and diarrhea.  Over the past few days, patient has had fevers with multiple episodes of nonbloody vomiting and diarrhea.  No recent travel or antibiotic use.  Was initially seen in urgent care where he was noted be febrile and tachycardic.  They were concerned about dehydration, so patient presented to the ER for IV fluids and further evaluation.  Denies abdominal pain.    Physical Exam   Triage Vital Signs: ED Triage Vitals  Encounter Vitals Group     BP 12/31/23 1621 (!) 111/94     Systolic BP Percentile --      Diastolic BP Percentile --      Pulse Rate 12/31/23 1621 98     Resp 12/31/23 1621 18     Temp 12/31/23 1621 100 F (37.8 C)     Temp Source 12/31/23 1621 Oral     SpO2 12/31/23 1621 98 %     Weight 12/31/23 1616 140 lb (63.5 kg)     Height 12/31/23 1616 5\' 8"  (1.727 m)     Head Circumference --      Peak Flow --      Pain Score 12/31/23 1616 10     Pain Loc --      Pain Education --      Exclude from Growth Chart --     Most recent vital signs: Vitals:   12/31/23 1700 12/31/23 1808  BP: (!) 129/91   Pulse: (!) 109 90  Resp:    Temp:  99.3 F (37.4 C)  SpO2: 98% 99%     General: Awake, interactive  CV:  Mild tachycardia, much improved heart rates documented in urgent care, normal peripheral perfusion Resp:  Unlabored respirations.  Abd:  Nondistended, soft, no significant tenderness to palpation Neuro:  Symmetric facial movement, fluid speech   ED Results / Procedures / Treatments   Labs (all labs ordered are listed, but only abnormal results are displayed) Labs Reviewed  COMPREHENSIVE METABOLIC PANEL - Abnormal; Notable for the following components:       Result Value   Sodium 132 (*)    Potassium 3.3 (*)    Chloride 97 (*)    CO2 18 (*)    Glucose, Bld 105 (*)    AST 189 (*)    ALT 54 (*)    Alkaline Phosphatase 162 (*)    Anion gap 17 (*)    All other components within normal limits  CBC - Abnormal; Notable for the following components:   RBC 3.76 (*)    HCT 37.0 (*)    MCH 36.2 (*)    MCHC 36.8 (*)    All other components within normal limits  URINALYSIS, ROUTINE W REFLEX MICROSCOPIC - Abnormal; Notable for the following components:   Color, Urine AMBER (*)    APPearance CLEAR (*)    Protein, ur 100 (*)    All other components within normal limits  LIPASE, BLOOD     EKG EKG independently reviewed interpreted by myself (ER attending) demonstrates:    RADIOLOGY Imaging independently reviewed and interpreted by myself demonstrates:    PROCEDURES:  Critical Care performed: No  Procedures   MEDICATIONS ORDERED IN ED:  Medications  sodium chloride 0.9 % bolus 1,000 mL (0 mLs Intravenous Stopped 12/31/23 1803)     IMPRESSION / MDM / ASSESSMENT AND PLAN / ED COURSE  I reviewed the triage vital signs and the nursing notes.  Differential diagnosis includes, but is not limited to, viral GI illness, electrolyte abnormality, anemia, lower suspicion acute intra-abdominal process given reassuring abdominal exam  Patient's presentation is most consistent with acute presentation with potential threat to life or bodily function.  43 year old male presenting to the emergency department for evaluation of vomiting and diarrhea.  Borderline tachycardia, but otherwise stable vitals.  Labs with mild hypokalemia and anion gap Malik acidosis, suspect likely related to dehydration.  Mild transaminitis noted with AST greater than ALT elevation.  This is similar to prior labs for the patient.  Urine without evidence of infection.  He was given a liter of IV fluid.  Did report feeling improvement was able to tolerate p.o. intake.  He is  comfortable with discharge home.  Do think this is reasonable.  Zofran has already been sent from urgent care.  Strict return precautions provided.  Patient discharged in stable condition.       FINAL CLINICAL IMPRESSION(S) / ED DIAGNOSES   Final diagnoses:  Vomiting and diarrhea     Rx / DC Orders   ED Discharge Orders     None        Note:  This document was prepared using Dragon voice recognition software and may include unintentional dictation errors.   Russell Post, MD 12/31/23 Russell Hansen

## 2023-12-31 NOTE — Discharge Instructions (Addendum)
 Go to the emergency department if your symptoms persist or worsen.    Follow-up with your primary care provider tomorrow.    Take Tylenol as directed for your fever.  Take the antinausea medicine as directed.  Keep yourself hydrated with clear liquids such as water.  Advance your diet as tolerated.

## 2023-12-31 NOTE — ED Provider Notes (Signed)
 Russell Hansen    CSN: [id number] Arrival date & time: 12/31/23  1452      History   Chief Complaint Chief Complaint  Patient presents with   Fever    HPI [name] is a 43 y.o. male.  Patient presents with 2-day history of fever, chills, vomiting, diarrhea.  Patient took Tylenol this morning at 830 but immediately vomited.  He reports approximately 10-12 episodes of emesis and 5-6 episodes of diarrhea today.  No recent travel out of the country or antibiotic use.  No alcohol use in the last 24 hours.  The history is provided by the patient and medical records.    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   Vitamin D deficiency 09/04/2017    Patient Active Problem List   Diagnosis Date Noted   Kidney lesion 08/23/2023   Thrombocytopenia (HCC) 07/26/2023   Alcohol use 07/26/2023   Elevated LFTs 07/26/2023    Past Surgical History:  Procedure Laterality Date   CHONDROPLASTY Right 03/05/2018   Procedure: CHONDROPLASTY;  Surgeon: Signa Kell, MD;  Location: [facility];  Service: Orthopedics;  Laterality: Right;   EYE SURGERY     KNEE ARTHROSCOPY WITH MENISCAL REPAIR Right 03/05/2018   Procedure: KNEE ARTHROSCOPY WITH MENISCAL REPAIR;  Surgeon: Signa Kell, MD;  Location: [facility];  Service: Orthopedics;  Laterality: Right;   KNEE SURGERY Bilateral 2003, 2013   ACL REPAIR   MOUTH SURGERY     dental implant   PROSTATE BIOPSY N/A 09/02/2021   Procedure: PROSTATE  BIOPSY Russell Hansen;  Surgeon: Orson Ape, MD;  Location: [facility];  Service: Urology;  Laterality: N/A;   WISDOM TOOTH EXTRACTION         Home Medications    Prior to Admission medications   Medication Sig Start Date End Date Taking? Authorizing Provider  ondansetron (ZOFRAN-ODT) 4 MG disintegrating tablet Take 1 tablet (4 mg total) by mouth every 8 (eight) hours as needed for nausea or vomiting. 12/31/23  Yes Mickie Bail, NP  acetaminophen (TYLENOL) 500 MG tablet Take 1,000 mg by mouth every 6 (six) hours as needed for moderate pain. Patient not taking: Reported on 12/31/2023    [provider]  acetaminophen (TYLENOL) 500 MG tablet Take 2 tablets (1,000 mg total) by mouth every 8 (eight) hours. Patient not taking: Reported on 12/31/2023 08/01/23 07/31/24  Signa Kell, MD  albuterol (PROVENTIL HFA;VENTOLIN HFA) 108 [phone] Base) MCG/ACT inhaler Inhale 2 puffs into the lungs every 6 (six) hours as needed for wheezing or shortness of breath. Patient not taking: Reported on 12/31/2023 03/05/18   Signa Kell, MD  amLODipine (NORVASC) 10 MG tablet Take 10 mg by mouth every morning.    [provider]  benzonatate (TESSALON PERLES) 100 MG capsule Take 1 capsule (100 mg total) by mouth 3 (three) times daily as needed for cough. Patient not taking: Reported on 12/31/2023 07/14/23 07/13/24  Corena Herter, MD  Cyanocobalamin (B-12 PO) Take 1 tablet by mouth daily.    [provider]  Esomeprazole Magnesium 20 MG TBEC Take 20 mg by mouth daily as needed (acid reflux).    [provider]  folic acid (FOLVITE) 1 MG tablet Take 1 tablet (1 mg total) by mouth daily. 07/14/23 07/13/24  Corena Herter, MD  Multiple Vitamins-Minerals (MULTIVITAMIN WITH MINERALS) tablet Take 1 tablet by mouth daily.    [provider]  sildenafil (VIAGRA) 100 MG tablet Take 100 mg by mouth daily as needed for erectile dysfunction.     [provider]  thiamine (VITAMIN B1) 100 MG tablet  Take 1 tablet (100 mg total) by mouth daily. 07/14/23 07/13/24  Corena Herter, MD  traMADol (ULTRAM) 50 MG tablet Take 1 tablet (50 mg total) by mouth every 4 (four) hours as needed. Patient not taking: Reported on 12/31/2023 08/01/23 07/31/24  Signa Kell, MD  VITAMIN D PO Take 1 tablet by mouth daily.    [provider]    Family History Family History  Problem Relation Age of Onset   Kidney disease Father     Social History Social History   Tobacco Use   Smoking status: Some Days    Types: Cigars   Smokeless tobacco: Never  Vaping Use   Vaping status: Never Used  Substance Use Topics   Alcohol use: Yes    Alcohol/week: 5.0 standard drinks of alcohol    Types: 5 Shots of liquor per week    Comment: BEER AND WINE OCC-STOPPED HARD LIQOUR MARCH 2018   Drug use: Not Currently    Types: Marijuana    Comment: only in the vape     Allergies   Doxycycline, Norco [hydrocodone-acetaminophen], and Oxycodone   Review of Systems Review of Systems  Constitutional:  Positive for chills and fever.  Respiratory:  Negative for cough and shortness of breath.   Cardiovascular:  Negative for chest pain and palpitations.  Gastrointestinal:  Positive for diarrhea, nausea and vomiting. Negative for abdominal pain.     Physical Exam Triage Vital Signs ED Triage Vitals  Encounter Vitals Group     BP 12/31/23 1509 117/89     Systolic BP Percentile --      Diastolic BP Percentile --      Pulse Rate 12/31/23 1509 (!) 151     Resp 12/31/23 1509 18     Temp 12/31/23 1509 (!) 102.7 F (39.3 C)     Temp src --      SpO2 12/31/23 1509 96 %     Weight --      Height --      Head Circumference --      Peak Flow --      Pain Score 12/31/23 1507 3     Pain Loc --      Pain Education --      Exclude from Growth Chart --    No data found.  Updated Vital Signs BP 117/89   Pulse (!) 114   Temp (!) 102.7 F (39.3 C)   Resp 18   SpO2 96%   Visual Acuity Right Eye Distance:    Left Eye Distance:   Bilateral Distance:    Right Eye Near:   Left Eye Near:    Bilateral Near:     Physical Exam Constitutional:      General: He is not in acute distress. HENT:     Mouth/Throat:  Mouth: Mucous membranes are dry.  Cardiovascular:     Rate and Rhythm: Regular rhythm. Tachycardia present.     Heart sounds: Normal heart sounds.  Pulmonary:     Effort: Pulmonary effort is normal. No respiratory distress.     Breath sounds: Normal breath sounds.  Abdominal:     General: Bowel sounds are normal.     Palpations: Abdomen is soft.     Tenderness: There is no abdominal tenderness. There is no guarding or rebound.  Neurological:     Mental Status: He is alert.      UC Treatments / Results  Labs (all labs ordered are listed, but only abnormal results are displayed) Labs Reviewed  POC COVID19/FLU A&B COMBO    EKG   Radiology No results found.  Procedures Procedures (including critical care time)  Medications Ordered in UC Medications  acetaminophen (TYLENOL) tablet 650 mg (650 mg Oral Given 12/31/23 1526)  ondansetron (ZOFRAN-ODT) disintegrating tablet 4 mg (4 mg Oral Given 12/31/23 1526)    Initial Impression / Assessment and Plan / UC Course  I have reviewed the triage vital signs and the nursing notes.  Pertinent labs & imaging results that were available during my care of the patient were reviewed by me and considered in my medical decision making (see chart for details).   Viral gastroenteritis.  Patient is febrile and tachycardic.  Discussed possibility of dehydration due to vomiting and diarrhea.  He declines transfer to the ED.  Zofran and Tylenol given here.  Patient able to tolerate these medications without emesis.  Rapid COVID and flu negative.  Discharging patient with prescription for Zofran and instructions to keep himself hydrated with clear liquids.  Instructed him to follow-up with his PCP tomorrow.  Strict ED precautions discussed at  length.  Education provided on viral gastroenteritis and fever.  Patient agrees to plan of care.  Final Clinical Impressions(s) / UC Diagnoses   Final diagnoses:  Viral gastroenteritis  Fever, unspecified     Discharge Instructions      Go to the emergency department if your symptoms persist or worsen.    Follow-up with your primary care provider tomorrow.    Take Tylenol as directed for your fever.  Take the antinausea medicine as directed.  Keep yourself hydrated with clear liquids such as water.  Advance your diet as tolerated.         ED Prescriptions     Medication Sig Dispense Auth. Provider   ondansetron (ZOFRAN-ODT) 4 MG disintegrating tablet Take 1 tablet (4 mg total) by mouth every 8 (eight) hours as needed for nausea or vomiting. 20 tablet Mickie Bail, NP      PDMP not reviewed this encounter.   Mickie Bail, NP 12/31/23 [phone]

## 2023-12-31 NOTE — ED Notes (Signed)
 Pt taken straight to open room so no labs were done. Maria EDT to collect vitals on pt.

## 2023-12-31 NOTE — Discharge Instructions (Addendum)
 You were seen in the ER today for your vomiting and diarrhea.  Your testing fortunately did not show an emergency cause for this.  I suspect you have a viral GI bug. You can use the Zofran you were given in urgent care as needed to help with your nausea.  Return to the ER for new or worsening symptoms including uncontrolled vomiting despite nausea medication, worsening abdominal pain, or any other new or concerning symptoms.

## 2024-01-03 ENCOUNTER — Encounter: Payer: Self-pay | Admitting: Emergency Medicine

## 2024-01-03 ENCOUNTER — Emergency Department
Admission: EM | Admit: 2024-01-03 | Discharge: 2024-01-04 | Disposition: A | Discharge status: Home / Self Care | Source: Home / Self Care | Attending: Emergency Medicine | Admitting: Emergency Medicine

## 2024-01-03 ENCOUNTER — Other Ambulatory Visit: Payer: Self-pay

## 2024-01-03 ENCOUNTER — Emergency Department
Admission: EM | Admit: 2024-01-03 | Discharge: 2024-01-03 | Disposition: A | Discharge status: Home / Self Care | Attending: Emergency Medicine | Admitting: Emergency Medicine

## 2024-01-03 DIAGNOSIS — R945 Abnormal results of liver function studies: Secondary | ICD-10-CM | POA: Insufficient documentation

## 2024-01-03 DIAGNOSIS — J45909 Unspecified asthma, uncomplicated: Secondary | ICD-10-CM | POA: Insufficient documentation

## 2024-01-03 DIAGNOSIS — F10230 Alcohol dependence with withdrawal, uncomplicated: Secondary | ICD-10-CM | POA: Insufficient documentation

## 2024-01-03 DIAGNOSIS — R42 Dizziness and giddiness: Secondary | ICD-10-CM | POA: Diagnosis present

## 2024-01-03 DIAGNOSIS — R7989 Other specified abnormal findings of blood chemistry: Secondary | ICD-10-CM

## 2024-01-03 DIAGNOSIS — R059 Cough, unspecified: Secondary | ICD-10-CM | POA: Diagnosis present

## 2024-01-03 DIAGNOSIS — I10 Essential (primary) hypertension: Secondary | ICD-10-CM | POA: Insufficient documentation

## 2024-01-03 DIAGNOSIS — E860 Dehydration: Secondary | ICD-10-CM | POA: Diagnosis not present

## 2024-01-03 DIAGNOSIS — F10139 Alcohol abuse with withdrawal, unspecified: Secondary | ICD-10-CM | POA: Insufficient documentation

## 2024-01-03 DIAGNOSIS — E876 Hypokalemia: Secondary | ICD-10-CM | POA: Diagnosis not present

## 2024-01-03 DIAGNOSIS — F10930 Alcohol use, unspecified with withdrawal, uncomplicated: Secondary | ICD-10-CM

## 2024-01-03 LAB — URINALYSIS, ROUTINE W REFLEX MICROSCOPIC
Glucose, UA: NEGATIVE mg/dL
Hgb urine dipstick: NEGATIVE
Ketones, ur: NEGATIVE mg/dL
Leukocytes,Ua: NEGATIVE
Nitrite: NEGATIVE
Protein, ur: 100 mg/dL — AB
Specific Gravity, Urine: 1.034 — ABNORMAL HIGH (ref 1.005–1.030)
pH: 5 (ref 5.0–8.0)

## 2024-01-03 LAB — HEPATIC FUNCTION PANEL
ALT: 68 U/L — ABNORMAL HIGH (ref 0–44)
AST: 321 U/L — ABNORMAL HIGH (ref 15–41)
Albumin: 3.5 g/dL (ref 3.5–5.0)
Alkaline Phosphatase: 170 U/L — ABNORMAL HIGH (ref 38–126)
Bilirubin, Direct: 0.5 mg/dL — ABNORMAL HIGH (ref 0.0–0.2)
Indirect Bilirubin: 0.6 mg/dL (ref 0.3–0.9)
Total Bilirubin: 1.1 mg/dL (ref 0.0–1.2)
Total Protein: 6.2 g/dL — ABNORMAL LOW (ref 6.5–8.1)

## 2024-01-03 LAB — CBC
HCT: 35.1 % — ABNORMAL LOW (ref 39.0–52.0)
Hemoglobin: 12.7 g/dL — ABNORMAL LOW (ref 13.0–17.0)
MCH: 36.1 pg — ABNORMAL HIGH (ref 26.0–34.0)
MCHC: 36.2 g/dL — ABNORMAL HIGH (ref 30.0–36.0)
MCV: 99.7 fL (ref 80.0–100.0)
Platelets: 220 10*3/uL (ref 150–400)
RBC: 3.52 MIL/uL — ABNORMAL LOW (ref 4.22–5.81)
RDW: 12.5 % (ref 11.5–15.5)
WBC: 7.4 10*3/uL (ref 4.0–10.5)
nRBC: 0 % (ref 0.0–0.2)

## 2024-01-03 LAB — RESP PANEL BY RT-PCR (RSV, FLU A&B, COVID)  RVPGX2
Influenza A by PCR: NEGATIVE
Influenza B by PCR: NEGATIVE
Resp Syncytial Virus by PCR: NEGATIVE
SARS Coronavirus 2 by RT PCR: NEGATIVE

## 2024-01-03 LAB — BASIC METABOLIC PANEL
Anion gap: 14 (ref 5–15)
Anion gap: 12 (ref 5–15)
BUN: 15 mg/dL (ref 6–20)
BUN: 9 mg/dL (ref 6–20)
CO2: 24 mmol/L (ref 22–32)
CO2: 20 mmol/L — ABNORMAL LOW (ref 22–32)
Calcium: 8.6 mg/dL — ABNORMAL LOW (ref 8.9–10.3)
Calcium: 8.1 mg/dL — ABNORMAL LOW (ref 8.9–10.3)
Chloride: 95 mmol/L — ABNORMAL LOW (ref 98–111)
Chloride: 99 mmol/L (ref 98–111)
Creatinine, Ser: 0.94 mg/dL (ref 0.61–1.24)
Creatinine, Ser: 0.77 mg/dL (ref 0.61–1.24)
GFR, Estimated: >60 mL/min (ref >60)
GFR, Estimated: >60 mL/min (ref >60)
Glucose, Bld: 116 mg/dL — ABNORMAL HIGH (ref 70–99)
Glucose, Bld: 86 mg/dL (ref 70–99)
Potassium: 2.8 mmol/L — ABNORMAL LOW (ref 3.5–5.1)
Potassium: 3.8 mmol/L (ref 3.5–5.1)
Sodium: 133 mmol/L — ABNORMAL LOW (ref 135–145)
Sodium: 131 mmol/L — ABNORMAL LOW (ref 135–145)

## 2024-01-03 LAB — GROUP A STREP BY PCR: Group A Strep by PCR: NOT DETECTED

## 2024-01-03 LAB — MAGNESIUM: Magnesium: 1.4 mg/dL — ABNORMAL LOW (ref 1.7–2.4)

## 2024-01-03 MED ORDER — LORAZEPAM 2 MG/ML IJ SOLN
2.0000 mg | Freq: Once | INTRAMUSCULAR | Status: AC
  Administered 2024-01-03: 2 mg via INTRAVENOUS
  Filled 2024-01-03: qty 1

## 2024-01-03 MED ORDER — POTASSIUM CHLORIDE CRYS ER 20 MEQ PO TBCR
40.0000 meq | EXTENDED_RELEASE_TABLET | Freq: Once | ORAL | Status: AC
  Administered 2024-01-03: 40 meq via ORAL
  Filled 2024-01-03: qty 2

## 2024-01-03 MED ORDER — SODIUM CHLORIDE 0.9 % IV BOLUS
1000.0000 mL | Freq: Once | INTRAVENOUS | Status: AC
  Administered 2024-01-03: 1000 mL via INTRAVENOUS

## 2024-01-03 MED ORDER — SODIUM CHLORIDE 0.9 % IV SOLN: Freq: Once | INTRAVENOUS | Status: AC

## 2024-01-03 MED ORDER — MAGNESIUM SULFATE 2 GM/50ML IV SOLN
2.0000 g | Freq: Once | INTRAVENOUS | Status: AC
  Administered 2024-01-03: 2 g via INTRAVENOUS
  Filled 2024-01-03: qty 50

## 2024-01-03 MED ORDER — CHLORDIAZEPOXIDE HCL 25 MG PO CAPS
25.0000 mg | ORAL_CAPSULE | Freq: Once | ORAL | Status: AC
  Administered 2024-01-03: 25 mg via ORAL
  Filled 2024-01-03: qty 1

## 2024-01-03 MED ORDER — GABAPENTIN 100 MG PO CAPS: 100.0000 mg | ORAL_CAPSULE | Freq: Three times a day (TID) | ORAL | 2 refills | Status: DC

## 2024-01-03 MED ORDER — CHLORDIAZEPOXIDE HCL 25 MG PO CAPS: ORAL_CAPSULE | ORAL | 0 refills | Status: AC

## 2024-01-03 MED ORDER — POTASSIUM CHLORIDE CRYS ER 20 MEQ PO TBCR: 20.0000 meq | EXTENDED_RELEASE_TABLET | Freq: Two times a day (BID) | ORAL | 0 refills | Status: DC

## 2024-01-03 NOTE — ED Triage Notes (Signed)
 Patient to ED from [facility] for dizziness, cough, and sore throat. States he had ear popping yesterday. States he hasn't slept but 10 hours since Saturday due to pins and needles in legs. Sent from [facility] for hypotension and tachycardia.

## 2024-01-03 NOTE — ED Triage Notes (Signed)
 Pt states he is here because his family wants him to detox from alcohol. Pt normally drinks 10-12 shots vodka and 6 beers a day. Pt last drink was 1300 today. Pt denies SI. Pt states he was dx with URI today

## 2024-01-03 NOTE — ED Provider Notes (Signed)
 [facility] Provider Note    Event Date/Time   First MD Initiated Contact with Patient 01/03/24 2155     (approximate)   History   Chief Complaint Detox   HPI  Russell Hansen is a 43 y.o. male with past medical history of hypertension, asthma, and alcohol abuse who presents to the ED requesting detox.  Patient reports that he has been consuming about a pint of liquor and 3-4 beers daily for the past few months and is now interested in getting detox.  He states that his last drink was around 1 PM this afternoon and he has begun to develop some symptoms of withdrawal.  He reports feeling shaky and generally unwell, has gone through alcohol withdrawal before but denies any history of seizures or DTs.  He does state that he has had cough and congestion with fevers for the past couple of days, was actually seen in the ED earlier today and diagnosed with viral URI.  He denies significant difficulty breathing, vomiting, diarrhea, or dysuria.     Physical Exam   Triage Vital Signs: ED Triage Vitals  Encounter Vitals Group     BP 01/03/24 2119 114/82     Systolic BP Percentile --      Diastolic BP Percentile --      Pulse Rate 01/03/24 2119 100     Resp 01/03/24 2119 20     Temp 01/03/24 2119 (!) 101.5 F (38.6 C)     Temp Source 01/03/24 2119 Oral     SpO2 01/03/24 2119 98 %     Weight 01/03/24 2118 140 lb (63.5 kg)     Height 01/03/24 2118 5\' 8"  (1.727 m)     Head Circumference --      Peak Flow --      Pain Score 01/03/24 2118 0     Pain Loc --      Pain Education --      Exclude from Growth Chart --     Most recent vital signs: Vitals:   01/03/24 2124 01/03/24 2149  BP: 114/82   Pulse: 100   Resp:    Temp:  99.5 F (37.5 C)  SpO2:      Constitutional: Alert and oriented. Eyes: Conjunctivae are normal. Head: Atraumatic. Nose: No congestion/rhinnorhea. Mouth/Throat: Mucous membranes are moist.  Cardiovascular: Normal rate, regular  rhythm. Grossly normal heart sounds.  2+ radial pulses bilaterally. Respiratory: Normal respiratory effort.  No retractions. Lungs CTAB. Gastrointestinal: Soft and nontender. No distention. Musculoskeletal: No lower extremity tenderness nor edema.  Neurologic:  Normal speech and language. No gross focal neurologic deficits are appreciated.    ED Results / Procedures / Treatments   Labs (all labs ordered are listed, but only abnormal results are displayed) Labs Reviewed  BASIC METABOLIC PANEL - Abnormal; Notable for the following components:      Result Value   Sodium 131 (*)    CO2 20 (*)    Calcium 8.1 (*)    All other components within normal limits  MAGNESIUM - Abnormal; Notable for the following components:   Magnesium 1.4 (*)    All other components within normal limits     PROCEDURES:  Critical Care performed: No  Procedures   MEDICATIONS ORDERED IN ED: Medications  magnesium sulfate IVPB 2 g 50 mL (has no administration in time range)  chlordiazePOXIDE (LIBRIUM) capsule 25 mg (has no administration in time range)  LORazepam (ATIVAN) injection 2 mg (2 mg Intravenous  Given 01/03/24 2241)  sodium chloride 0.9 % bolus 1,000 mL (0 mLs Intravenous Stopped 01/03/24 2334)     IMPRESSION / MDM / ASSESSMENT AND PLAN / ED COURSE  I reviewed the triage vital signs and the nursing notes.                              43 y.o. male with past medical history of hypertension, alcohol abuse, and asthma who presents to the ED requesting alcohol detox, reports feeling tremulous with symptoms of withdrawal since last drink around 1 PM this afternoon.  Patient's presentation is most consistent with acute presentation with potential threat to life or bodily function.  Differential diagnosis includes, but is not limited to, alcohol withdrawal, anemia, electrolyte abnormality, AKI.  Patient nontoxic-appearing and in no acute distress, vital signs initially showed fever however this  resolved without intervention.  He does have symptoms consistent with viral URI, COVID and flu testing was negative earlier today.  No features concerning for pneumonia or sepsis.  He did have labs showing transaminitis earlier today, was also noted to be hypokalemic at that time.  Potassium is now improved without AKI, magnesium level found to be low and we will replete IV magnesium.  Withdrawal symptoms improved following IV fluids and Ativan.  Patient appropriate for outpatient management of alcohol detox, referral provided for detox and prescription provided for Librium.  He was counseled to return to the ED for new or worsening symptoms, patient agrees with plan.      FINAL CLINICAL IMPRESSION(S) / ED DIAGNOSES   Final diagnoses:  Alcohol withdrawal syndrome without complication (HCC)     Rx / DC Orders   ED Discharge Orders          Ordered    chlordiazePOXIDE (LIBRIUM) 25 MG capsule  Multiple Frequencies        01/03/24 2324             Note:  This document was prepared using Dragon voice recognition software and may include unintentional dictation errors.   Chesley Noon, MD 01/03/24 [phone]

## 2024-01-03 NOTE — ED Provider Notes (Signed)
 [facility] Provider Note    Event Date/Time   First MD Initiated Contact with Patient 01/03/24 [phone]     (approximate)   History   Dizziness   HPI  Russell Hansen is a 43 y.o. male who was sent from clinic for low blood pressure, dizziness.  However patient's primary complaint is sensation of something crawling on his legs.  He reports this has been present for years but seems to have worsened after his recent GI illness.  No longer having nausea or diarrhea.     Physical Exam   Triage Vital Signs: ED Triage Vitals  Encounter Vitals Group     BP 01/03/24 0833 96/77     Systolic BP Percentile --      Diastolic BP Percentile --      Pulse Rate 01/03/24 0833 72     Resp 01/03/24 0833 18     Temp 01/03/24 0833 98.3 F (36.8 C)     Temp Source 01/03/24 0833 Oral     SpO2 01/03/24 0833 96 %     Weight 01/03/24 0831 64.9 kg (143 lb)     Height 01/03/24 0831 1.727 m (5\' 8" )     Head Circumference --      Peak Flow --      Pain Score 01/03/24 0831 7     Pain Loc --      Pain Education --      Exclude from Growth Chart --     Most recent vital signs: Vitals:   01/03/24 0833 01/03/24 1030  BP: 96/77 117/85  Pulse: 72 93  Resp: 18 (!) 26  Temp: 98.3 F (36.8 C)   SpO2: 96% 100%     General: Awake, no distress.  CV:  Good peripheral perfusion.  Resp:  Normal effort.  Abd:  No distention.  Other:  Normal neurologic exam, overall well-appearing   ED Results / Procedures / Treatments   Labs (all labs ordered are listed, but only abnormal results are displayed) Labs Reviewed  BASIC METABOLIC PANEL - Abnormal; Notable for the following components:      Result Value   Sodium 133 (*)    Potassium 2.8 (*)    Chloride 95 (*)    Glucose, Bld 116 (*)    Calcium 8.6 (*)    All other components within normal limits  CBC - Abnormal; Notable for the following components:   RBC 3.52 (*)    Hemoglobin 12.7 (*)    HCT 35.1 (*)    MCH 36.1  (*)    MCHC 36.2 (*)    All other components within normal limits  URINALYSIS, ROUTINE W REFLEX MICROSCOPIC - Abnormal; Notable for the following components:   Color, Urine AMBER (*)    APPearance HAZY (*)    Specific Gravity, Urine 1.034 (*)    Bilirubin Urine MODERATE (*)    Protein, ur 100 (*)    Bacteria, UA MANY (*)    All other components within normal limits  HEPATIC FUNCTION PANEL - Abnormal; Notable for the following components:   Total Protein 6.2 (*)    AST 321 (*)    ALT 68 (*)    Alkaline Phosphatase 170 (*)    Bilirubin, Direct 0.5 (*)    All other components within normal limits  RESP PANEL BY RT-PCR (RSV, FLU A&B, COVID)  RVPGX2  GROUP A STREP BY PCR  CBG MONITORING, ED     EKG  ED ECG  REPORT I, Jene Every, the attending physician, personally viewed and interpreted this ECG.  Date: 01/03/2024  Rhythm: Sinus tachycardia QRS Axis: normal Intervals: normal ST/T Wave abnormalities: normal Narrative Interpretation: no evidence of acute ischemia    RADIOLOGY     PROCEDURES:  Critical Care performed:   Procedures   MEDICATIONS ORDERED IN ED: Medications  0.9 %  sodium chloride infusion (0 mLs Intravenous Stopped 01/03/24 1118)  potassium chloride SA (KLOR-CON M) CR tablet 40 mEq (40 mEq Oral Given 01/03/24 0934)     IMPRESSION / MDM / ASSESSMENT AND PLAN / ED COURSE  I reviewed the triage vital signs and the nursing notes. Patient's presentation is most consistent with acute presentation with potential threat to life or bodily function.  Patient presents with reported hypotension, tachycardia.  He is tachycardic upon arrival here with heart rates in the 120s.  On exam he is overall quite well-appearing.  I suspect dehydration as primary cause of his symptoms.  His blood pressure is low at baseline per review of records.  He also has mild hypokalemia, will treat with IV fluids, p.o. K-Dur, reevaluate.  Patient feeling better after treatment,  will trial gabapentin for him for his likely restless legs, potassium prescription, close follow-up with PCP recommended      FINAL CLINICAL IMPRESSION(S) / ED DIAGNOSES   Final diagnoses:  Dizziness  Dehydration  Elevated LFTs     Rx / DC Orders   ED Discharge Orders          Ordered    gabapentin (NEURONTIN) 100 MG capsule  3 times daily        01/03/24 1110    potassium chloride SA (KLOR-CON M) 20 MEQ tablet  2 times daily        01/03/24 1110             Note:  This document was prepared using Dragon voice recognition software and may include unintentional dictation errors.   Jene Every, MD 01/03/24 1147

## 2024-04-24 ENCOUNTER — Emergency Department
Admission: EM | Admit: 2024-04-24 | Discharge: 2024-04-25 | Disposition: A | Discharge status: Home / Self Care | Attending: Emergency Medicine | Admitting: Emergency Medicine

## 2024-04-24 ENCOUNTER — Other Ambulatory Visit: Payer: Self-pay

## 2024-04-24 ENCOUNTER — Encounter: Payer: Self-pay | Admitting: *Deleted

## 2024-04-24 DIAGNOSIS — R112 Nausea with vomiting, unspecified: Secondary | ICD-10-CM | POA: Insufficient documentation

## 2024-04-24 DIAGNOSIS — R079 Chest pain, unspecified: Secondary | ICD-10-CM | POA: Insufficient documentation

## 2024-04-24 DIAGNOSIS — F10239 Alcohol dependence with withdrawal, unspecified: Secondary | ICD-10-CM | POA: Diagnosis present

## 2024-04-24 DIAGNOSIS — F10930 Alcohol use, unspecified with withdrawal, uncomplicated: Secondary | ICD-10-CM

## 2024-04-24 DIAGNOSIS — E876 Hypokalemia: Secondary | ICD-10-CM | POA: Diagnosis not present

## 2024-04-24 DIAGNOSIS — R7401 Elevation of levels of liver transaminase levels: Secondary | ICD-10-CM | POA: Insufficient documentation

## 2024-04-24 DIAGNOSIS — I10 Essential (primary) hypertension: Secondary | ICD-10-CM | POA: Insufficient documentation

## 2024-04-24 DIAGNOSIS — R110 Nausea: Secondary | ICD-10-CM

## 2024-04-24 DIAGNOSIS — Y905 Blood alcohol level of 100-119 mg/100 ml: Secondary | ICD-10-CM | POA: Diagnosis not present

## 2024-04-24 LAB — CBC
HCT: 37.4 % — ABNORMAL LOW (ref 39.0–52.0)
Hemoglobin: 13.2 g/dL (ref 13.0–17.0)
MCH: 33.9 pg (ref 26.0–34.0)
MCHC: 35.3 g/dL (ref 30.0–36.0)
MCV: 96.1 fL (ref 80.0–100.0)
Platelets: 185 10*3/uL (ref 150–400)
RBC: 3.89 MIL/uL — ABNORMAL LOW (ref 4.22–5.81)
RDW: 13.7 % (ref 11.5–15.5)
WBC: 7.2 10*3/uL (ref 4.0–10.5)
nRBC: 0 % (ref 0.0–0.2)

## 2024-04-24 LAB — COMPREHENSIVE METABOLIC PANEL WITH GFR
ALT: 122 U/L — ABNORMAL HIGH (ref 0–44)
AST: 288 U/L — ABNORMAL HIGH (ref 15–41)
Albumin: 4.1 g/dL (ref 3.5–5.0)
Alkaline Phosphatase: 80 U/L (ref 38–126)
Anion gap: 14 (ref 5–15)
BUN: 16 mg/dL (ref 6–20)
CO2: 26 mmol/L (ref 22–32)
Calcium: 9.5 mg/dL (ref 8.9–10.3)
Chloride: 94 mmol/L — ABNORMAL LOW (ref 98–111)
Creatinine, Ser: 0.79 mg/dL (ref 0.61–1.24)
GFR, Estimated: >60 mL/min (ref >60)
Glucose, Bld: 111 mg/dL — ABNORMAL HIGH (ref 70–99)
Potassium: 3.2 mmol/L — ABNORMAL LOW (ref 3.5–5.1)
Sodium: 134 mmol/L — ABNORMAL LOW (ref 135–145)
Total Bilirubin: 0.5 mg/dL (ref 0.0–1.2)
Total Protein: 6.8 g/dL (ref 6.5–8.1)

## 2024-04-24 LAB — ETHANOL: Alcohol, Ethyl (B): 102 mg/dL — ABNORMAL HIGH (ref <15)

## 2024-04-24 LAB — URINE DRUG SCREEN, QUALITATIVE (ARMC ONLY)
Amphetamines, Ur Screen: NOT DETECTED
Barbiturates, Ur Screen: NOT DETECTED
Benzodiazepine, Ur Scrn: NOT DETECTED
Cannabinoid 50 Ng, Ur ~~LOC~~: NOT DETECTED
Cocaine Metabolite,Ur ~~LOC~~: NOT DETECTED
MDMA (Ecstasy)Ur Screen: NOT DETECTED
Methadone Scn, Ur: NOT DETECTED
Opiate, Ur Screen: NOT DETECTED
Phencyclidine (PCP) Ur S: NOT DETECTED
Tricyclic, Ur Screen: POSITIVE — AB

## 2024-04-24 LAB — TROPONIN I (HIGH SENSITIVITY): Troponin I (High Sensitivity): 4 ng/L (ref <18)

## 2024-04-24 NOTE — ED Triage Notes (Addendum)
 Pt ambulatory to triage.   Pt asking for alcohol detox.  Denies Si or HI.   Denies drug use.  Last etoh was today.  Pt alert   pt reports not sleeping and night sweats with decreased appetite.  Pt took bp meds today.

## 2024-04-24 NOTE — ED Provider Notes (Signed)
 [facility] Provider Note    Event Date/Time   First MD Initiated Contact with Patient 04/24/24 2337     (approximate)   History   Alcohol Problem   HPI  Russell Hansen is a 43 y.o. male with history of alcohol abuse, anxiety, hypertension, GERD, presenting with symptoms and not sleeping for the last several days, also with decreased appetite, did note chest pain 2 days ago, no chest pain now.  Also had some nausea vomiting per wife yesterday.  He states that he is constipated.  Has been taking his blood pressure medications.  Is followed by [facility] for alcohol detox and is scheduled to go for outpatient detox soon.  States that his last alcohol use was this morning.  States that he has prior history of withdrawals but no withdrawal seizures in the past.  He does not have any abdominal pain, no diarrhea or urinary symptoms.  Also noted some tingling to his lower extremities but no numbness or weakness, no back pain or saddle anesthesia, no incontinence.  No fevers.  Independent she obtained from wife as above.  On independent review, he was seen in the emergency department in March for alcohol use and requesting detox, was discharged with Librium .  Was seen by his primary care doctor in April, he was seen in the ED for detox, this was followed by 21 days in inpatient rehab for alcoholism, he noted feeling much better, had neuropathy symptoms at that time, pins-and-needles to his legs, was given hydroxyzine for the neuropathy as well as anxiety.     Physical Exam   Triage Vital Signs: ED Triage Vitals  Encounter Vitals Group     BP 04/24/24 2149 (!) 177/149     Girls Systolic BP Percentile --      Girls Diastolic BP Percentile --      Boys Systolic BP Percentile --      Boys Diastolic BP Percentile --      Pulse Rate 04/24/24 2149 (!) 120     Resp 04/24/24 2149 20     Temp 04/24/24 2149 98 F (36.7 C)     Temp Source 04/24/24 2149 Oral     SpO2  04/24/24 2149 97 %     Weight 04/24/24 2146 145 lb (65.8 kg)     Height 04/24/24 2146 5' 8 (1.727 m)     Head Circumference --      Peak Flow --      Pain Score 04/24/24 2146 0     Pain Loc --      Pain Education --      Exclude from Growth Chart --     Most recent vital signs: Vitals:   04/24/24 2149  BP: (!) 177/149  Pulse: (!) 120  Resp: 20  Temp: 98 F (36.7 C)  SpO2: 97%     General: Awake, no distress.  CV:  Good peripheral perfusion.  Resp:  Normal effort.  No tachypnea or respiratory distress Abd:  No distention.  Soft nontender Other:  No midline spinal tenderness, no saddle anesthesia, no focal weakness or numbness, he does have dry mucous membranes with mild tongue fasciculations, he does have bilateral hand tremors.   ED Results / Procedures / Treatments   Labs (all labs ordered are listed, but only abnormal results are displayed) Labs Reviewed  COMPREHENSIVE METABOLIC PANEL WITH GFR - Abnormal; Notable for the following components:      Result Value   Sodium  134 (*)    Potassium 3.2 (*)    Chloride 94 (*)    Glucose, Bld 111 (*)    AST 288 (*)    ALT 122 (*)    All other components within normal limits  ETHANOL - Abnormal; Notable for the following components:   Alcohol, Ethyl (B) 102 (*)    All other components within normal limits  CBC - Abnormal; Notable for the following components:   RBC 3.89 (*)    HCT 37.4 (*)    All other components within normal limits  URINE DRUG SCREEN, QUALITATIVE ([facility] ONLY) - Abnormal; Notable for the following components:   Tricyclic, Ur Screen POSITIVE (*)    All other components within normal limits  TROPONIN I (HIGH SENSITIVITY)  TROPONIN I (HIGH SENSITIVITY)     EKG  EKG shows, sinus tachycardia, rate 126, normal QRS, normal QTc, no obvious ischemic ST elevation, T wave inversion to V5, V6, mild ST depressions to 1, aVF, not significant change compared to prior   RADIOLOGY On my independent  interpretation, chest x-ray without obvious consolidation   PROCEDURES:  Critical Care performed: Yes, see critical care procedure note(s)  .Critical Care  Performed by: Waymond Lorelle Cummins, MD Authorized by: Waymond Lorelle Cummins, MD   Critical care provider statement:    Critical care time (minutes):  40   Critical care was necessary to treat or prevent imminent or life-threatening deterioration of the following conditions:  Toxidrome (Alcohol withdrawal)   Critical care was time spent personally by me on the following activities:  Development of treatment plan with patient or surrogate, discussions with consultants, evaluation of patient's response to treatment, examination of patient, ordering and review of laboratory studies, ordering and review of radiographic studies, ordering and performing treatments and interventions, pulse oximetry, re-evaluation of patient's condition and review of old charts    MEDICATIONS ORDERED IN ED: Medications  sodium chloride  0.9 % bolus 1,000 mL (1,000 mLs Intravenous New Bag/Given 04/25/24 0039)  PHENObarbital (LUMINAL) injection 130 mg (130 mg Intravenous Given 04/25/24 0038)  potassium chloride  SA (KLOR-CON  M) CR tablet 40 mEq (40 mEq Oral Given 04/25/24 0111)  ondansetron  (ZOFRAN ) injection 4 mg (4 mg Intravenous Given 04/25/24 0106)     IMPRESSION / MDM / ASSESSMENT AND PLAN / ED COURSE  I reviewed the triage vital signs and the nursing notes.                              Differential diagnosis includes, but is not limited to, mild alcohol withdrawal, dehydration, electrolyte derangements, ACS, no infectious symptoms suggest pneumonia or viral illness.  No focal deficits on exam, no saddle anesthesia, no incontinence to suggest CVA or spinal etiology.  Suspect also some anxiety, chronic neuropathy.  Will get labs, EKG, troponin, chest x-ray, alcohol level.  Urine drug screen was obtained out of triage.  Will given some IV fluids, IV phenobarbital,  reassess.  Patient's presentation is most consistent with acute presentation with potential threat to life or bodily function.  Independent interpretation of labs and imaging below.  On reassessment patient is feeling lot better, tolerating p.o., he is no longer tachycardic or hypertensive.  No longer nauseous.  Labs are reassuring.  Considered but no indication for inpatient admission at this time, he is safe for outpatient management.  Will discharge with strict precautions.  Shared decision making to patient and wife and they are agreeable with this plan.  Discussed about taking melatonin for sleep.  Also discussed cardiology referral.  Instructed to follow-up with primary care doctor next week for reassessment as well as to go to his [facility] appointment on Monday.  Strict return precautions given.  Discharge.  The patient is on the cardiac monitor to evaluate for evidence of arrhythmia and/or significant heart rate changes.   Clinical Course as of 04/25/24 0139  Wed Apr 24, 2024  2356 Independent review of labs, AST and ALT are elevated, suspect this might be related to alcohol use, no leukocytosis, mild hypokalemia, creatinine is normal, he is TCA positive, alcohol is elevated to 102, troponin is not elevated. [TT]  Thu Apr 25, 2024  0122 Troponin I (High Sensitivity) Troponin x 2 is not elevated. [TT]  0123 DG Chest 1 View No active disease.  [TT]    Clinical Course User Index [TT] Waymond, Lorelle Cummins, MD     FINAL CLINICAL IMPRESSION(S) / ED DIAGNOSES   Final diagnoses:  Alcohol withdrawal syndrome without complication (HCC)  Chest pain, unspecified type  Nausea     Rx / DC Orders   ED Discharge Orders          Ordered    Ambulatory referral to Cardiology       Comments: If you have not heard from the Cardiology office within the next 72 hours please call [phone].   04/25/24 0138             Note:  This document was prepared using Dragon voice recognition software  and may include unintentional dictation errors.    Waymond Lorelle Cummins, MD 04/25/24 [phone]

## 2024-04-25 ENCOUNTER — Emergency Department

## 2024-04-25 LAB — TROPONIN I (HIGH SENSITIVITY): Troponin I (High Sensitivity): 7 ng/L (ref <18)

## 2024-04-25 MED ORDER — POTASSIUM CHLORIDE CRYS ER 20 MEQ PO TBCR
40.0000 meq | EXTENDED_RELEASE_TABLET | Freq: Once | ORAL | Status: AC
  Administered 2024-04-25: 40 meq via ORAL
  Filled 2024-04-25: qty 2

## 2024-04-25 MED ORDER — ONDANSETRON HCL 4 MG/2ML IJ SOLN
4.0000 mg | Freq: Once | INTRAMUSCULAR | Status: AC
  Administered 2024-04-25: 4 mg via INTRAVENOUS
  Filled 2024-04-25: qty 2

## 2024-04-25 MED ORDER — PHENOBARBITAL SODIUM 65 MG/ML IJ SOLN
130.0000 mg | Freq: Once | INTRAMUSCULAR | Status: AC
  Administered 2024-04-25: 130 mg via INTRAVENOUS
  Filled 2024-04-25: qty 2

## 2024-04-25 MED ORDER — SODIUM CHLORIDE 0.9 % IV BOLUS
1000.0000 mL | Freq: Once | INTRAVENOUS | Status: AC
  Administered 2024-04-25: 1000 mL via INTRAVENOUS

## 2024-04-25 NOTE — ED Notes (Signed)
 Pt slight tremors have improved after receiving IV Phenobarbital. Pt ABCs intact. RR even and unlabored. Pt in NAD. Bed in lowest locked position. Call bell in reach. Denies needs at this time.

## 2024-04-25 NOTE — ED Notes (Signed)
 Pt reporting to ED d/t alcohol withdrawal symptoms. PT states that he has not been sleeping for the last 3 days, having night sweats, decreased appetite and has tremors. Pt states that he wants help with detox. PT connected to monitor. Pt ABCs intact. RR even and unlabored. Pt in NAD at this time. Bed in lowest locked position. Call bell in reach. Denies needs at this time.   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

## 2024-04-25 NOTE — Discharge Instructions (Signed)
 Please make sure to keep yourself hydrated, please go to [facility] for your appointment on Monday.  I have also put in a cardiology referral for you for the chest pain.

## 2024-04-27 ENCOUNTER — Emergency Department

## 2024-04-27 ENCOUNTER — Emergency Department
Admission: EM | Admit: 2024-04-27 | Discharge: 2024-04-27 | Disposition: A | Discharge status: Home / Self Care | Attending: Emergency Medicine | Admitting: Emergency Medicine

## 2024-04-27 ENCOUNTER — Other Ambulatory Visit: Payer: Self-pay

## 2024-04-27 DIAGNOSIS — R079 Chest pain, unspecified: Secondary | ICD-10-CM | POA: Diagnosis present

## 2024-04-27 DIAGNOSIS — J45909 Unspecified asthma, uncomplicated: Secondary | ICD-10-CM | POA: Diagnosis not present

## 2024-04-27 DIAGNOSIS — R0789 Other chest pain: Secondary | ICD-10-CM | POA: Diagnosis not present

## 2024-04-27 DIAGNOSIS — H43399 Other vitreous opacities, unspecified eye: Secondary | ICD-10-CM | POA: Insufficient documentation

## 2024-04-27 LAB — BASIC METABOLIC PANEL WITH GFR
Anion gap: 9 (ref 5–15)
BUN: 11 mg/dL (ref 6–20)
CO2: 24 mmol/L (ref 22–32)
Calcium: 9.1 mg/dL (ref 8.9–10.3)
Chloride: 101 mmol/L (ref 98–111)
Creatinine, Ser: 0.7 mg/dL (ref 0.61–1.24)
GFR, Estimated: >60 mL/min (ref >60)
Glucose, Bld: 109 mg/dL — ABNORMAL HIGH (ref 70–99)
Potassium: 4.2 mmol/L (ref 3.5–5.1)
Sodium: 134 mmol/L — ABNORMAL LOW (ref 135–145)

## 2024-04-27 LAB — LIPASE, BLOOD: Lipase: 41 U/L (ref 11–51)

## 2024-04-27 LAB — CBC
HCT: 36.8 % — ABNORMAL LOW (ref 39.0–52.0)
Hemoglobin: 12.9 g/dL — ABNORMAL LOW (ref 13.0–17.0)
MCH: 34.2 pg — ABNORMAL HIGH (ref 26.0–34.0)
MCHC: 35.1 g/dL (ref 30.0–36.0)
MCV: 97.6 fL (ref 80.0–100.0)
Platelets: 198 K/uL (ref 150–400)
RBC: 3.77 MIL/uL — ABNORMAL LOW (ref 4.22–5.81)
RDW: 14 % (ref 11.5–15.5)
WBC: 6.3 K/uL (ref 4.0–10.5)
nRBC: 0 % (ref 0.0–0.2)

## 2024-04-27 LAB — HEPATIC FUNCTION PANEL
ALT: 121 U/L — ABNORMAL HIGH (ref 0–44)
AST: 323 U/L — ABNORMAL HIGH (ref 15–41)
Albumin: 4.1 g/dL (ref 3.5–5.0)
Alkaline Phosphatase: 75 U/L (ref 38–126)
Bilirubin, Direct: <0.1 mg/dL (ref 0.0–0.2)
Total Bilirubin: 0.4 mg/dL (ref 0.0–1.2)
Total Protein: 6.7 g/dL (ref 6.5–8.1)

## 2024-04-27 LAB — TROPONIN I (HIGH SENSITIVITY)
Troponin I (High Sensitivity): 6 ng/L (ref <18)
Troponin I (High Sensitivity): 7 ng/L (ref <18)

## 2024-04-27 MED ORDER — ALUM & MAG HYDROXIDE-SIMETH 200-200-20 MG/5ML PO SUSP
30.0000 mL | Freq: Once | ORAL | Status: AC
  Administered 2024-04-27: 30 mL via ORAL
  Filled 2024-04-27: qty 30

## 2024-04-27 MED ORDER — LIDOCAINE VISCOUS HCL 2 % MT SOLN
15.0000 mL | Freq: Once | OROMUCOSAL | Status: AC
  Administered 2024-04-27: 15 mL via ORAL
  Filled 2024-04-27: qty 15

## 2024-04-27 MED ORDER — PANTOPRAZOLE SODIUM 20 MG PO TBEC: 20.0000 mg | DELAYED_RELEASE_TABLET | Freq: Every day | ORAL | 1 refills | Status: DC

## 2024-04-27 NOTE — ED Provider Notes (Signed)
 [facility] Provider Note    Event Date/Time   First MD Initiated Contact with Patient 04/27/24 [phone]     (approximate)   History   Chest Pain   HPI  Russell Hansen is a 43 y.o. male with history of alcohol use, asthma, GERD presenting with chest pain.  It occurred after he started eating, states that he happens sometimes when he tries to eat, swallows and then feels a sharp pain.  States that chest pain had resolved.  States that when he eats, he had an episode where he regurgitated the food, is tolerating p.o. now.  States that when it occurred, he had some chest pain and shortness of breath that resolved.  No cough or other infectious symptoms.  He denies any tremors.  States that he also noticed some floaters earlier today that have resolved.  He denies any flashing light, curtains falling, headache, no double vision or blurry vision.  He denies any history of malignancies, no history of DVTs, no recent travel or surgeries, no hemoptysis or unilateral calf swelling or tenderness.  Per independent history from wife, he has been having acid reflux and GERD like symptoms for years, has not followed up with GI recently to get an EGD or colonoscopy because he missed an appointment.    On independent chart review, he was seen by primary care in April, was in the ED for detox and then went to rehab for alcoholism.  Patient had been reporting feeling much better after he got out of rehab.     Physical Exam   Triage Vital Signs: ED Triage Vitals  Encounter Vitals Group     BP 04/27/24 0257 (!) 134/107     Girls Systolic BP Percentile --      Girls Diastolic BP Percentile --      Boys Systolic BP Percentile --      Boys Diastolic BP Percentile --      Pulse Rate 04/27/24 0257 (!) 111     Resp 04/27/24 0257 18     Temp 04/27/24 0257 98.6 F (37 C)     Temp Source 04/27/24 0257 Oral     SpO2 04/27/24 0257 98 %     Weight 04/27/24 0258 140 lb (63.5 kg)      Height 04/27/24 0258 5' 8 (1.727 m)     Head Circumference --      Peak Flow --      Pain Score 04/27/24 0258 0     Pain Loc --      Pain Education --      Exclude from Growth Chart --     Most recent vital signs: Vitals:   04/27/24 0257 04/27/24 0326  BP: (!) 134/107 130/89  Pulse: (!) 111 86  Resp: 18 (!) 24  Temp: 98.6 F (37 C)   SpO2: 98% 99%     General: Awake, no distress.  CV:  Good peripheral perfusion.  Resp:  Normal effort.  Clear, no tachypnea or respiratory distress Abd:  No distention.  Soft nontender Other:  No lower extremity edema, no unilateral calf swelling or tenderness   ED Results / Procedures / Treatments   Labs (all labs ordered are listed, but only abnormal results are displayed) Labs Reviewed  BASIC METABOLIC PANEL WITH GFR - Abnormal; Notable for the following components:      Result Value   Sodium 134 (*)    Glucose, Bld 109 (*)    All  other components within normal limits  CBC - Abnormal; Notable for the following components:   RBC 3.77 (*)    Hemoglobin 12.9 (*)    HCT 36.8 (*)    MCH 34.2 (*)    All other components within normal limits  HEPATIC FUNCTION PANEL - Abnormal; Notable for the following components:   AST 323 (*)    ALT 121 (*)    All other components within normal limits  LIPASE, BLOOD  TROPONIN I (HIGH SENSITIVITY)  TROPONIN I (HIGH SENSITIVITY)     EKG  EKG shows, sinus tachycardia, rate 108, normal QRS, normal QTc, T wave inversion to 3, aVF, T wave flattening to V6, no ischemic ST elevation, T wave changes are new compared to prior   RADIOLOGY On my independent interpretation, chest x-ray without focal consolidation   PROCEDURES:  Critical Care performed: No  Procedures   MEDICATIONS ORDERED IN ED: Medications  alum & mag hydroxide-simeth (MAALOX/MYLANTA) 200-200-20 MG/5ML suspension 30 mL (30 mLs Oral Given 04/27/24 0355)    And  lidocaine  (XYLOCAINE ) 2 % viscous mouth solution 15 mL (15 mLs Oral  Given 04/27/24 0355)     IMPRESSION / MDM / ASSESSMENT AND PLAN / ED COURSE  I reviewed the triage vital signs and the nursing notes.                              Differential diagnosis includes, but is not limited to, ACS, GERD, angina, esophagitis, acid reflux.  Will get labs, chest x-ray, GI cocktail.  Reassess.  For the floaters, considered vitreous hemorrhage, he denies any floaters at this time, no curtains falling, no vision changes, no blurry vision, doubt CVA, retinal detachment at this time.  Patient's presentation is most consistent with acute presentation with potential threat to life or bodily function.  Independent interpretation of labs and imaging below.  On reassessment patient and his feeling a lot better, tolerating p.o.  Discussed with him about outpatient follow-up with GI, given prescription for Protonix , will also give number to call for [facility] to follow-up about the transient floaters.  Considered but no indication for inpatient admission at this time, he safe for outpatient management.  Will discharge with strict precautions.  Shared decision making done with patient and wife and they are agreeable with this plan.  The patient is on the cardiac monitor to evaluate for evidence of arrhythmia and/or significant heart rate changes.   Clinical Course as of 04/27/24 0542  Sat Apr 27, 2024  0430 Independent review of labs, initial troponin is negative, electrolytes not severely deranged, creatinine is normal, AST and ALT are elevated but this appears not significant change compared to prior, consistent with his history of alcohol use, T. bili is not elevated.  He does not have any abdominal pain or nausea or vomiting at this time, will hold off imaging.  No leukocytosis. [TT]  0534 Troponin I (High Sensitivity) Troponin x 2 is not elevated. [TT]  0541 DG Chest 1 View No active disease.  [TT]    Clinical Course User Index [TT] Waymond, Lorelle Cummins, MD     FINAL  CLINICAL IMPRESSION(S) / ED DIAGNOSES   Final diagnoses:  Burning chest pain  Vitreous floaters, unspecified laterality     Rx / DC Orders   ED Discharge Orders          Ordered    pantoprazole  (PROTONIX ) 20 MG tablet  Daily  04/27/24 0537             Note:  This document was prepared using Dragon voice recognition software and may include unintentional dictation errors.    Waymond Lorelle Cummins, MD 04/27/24 [phone]

## 2024-04-27 NOTE — ED Notes (Signed)
 Pt sts that he feels like he has esophageal spasms after eating or swallowing something.  Pain only comes after eating or drinking.  Pt is also c/o pins/needles sensation in lower extremities.  Sts that he was here on Wednesday for ETOH withdrawls and was given phenobarbital  on Wednesday.  Pt is feeling anxious and seeing floaters

## 2024-04-27 NOTE — Discharge Instructions (Addendum)
 Please make sure to follow-up with your gastroenterologist to get an EGD done to better evaluate your symptoms.  I have also put in several numbers for you to call if you are unable to get in touch with the gastroenterologist.  I am also sending a prescription of Protonix  to your pharmacy.

## 2024-04-27 NOTE — ED Triage Notes (Signed)
 Patient ambulatory to triage with complaints of chest pain that started around 2145 last night while eating. States this happens sometimes when he feels like when he's trying to eat and swallow he gets a sharp pain in his chest. Denies current chest pain.

## 2024-05-08 ENCOUNTER — Other Ambulatory Visit: Payer: Self-pay | Admitting: Gastroenterology

## 2024-05-08 DIAGNOSIS — R748 Abnormal levels of other serum enzymes: Secondary | ICD-10-CM

## 2024-05-09 ENCOUNTER — Ambulatory Visit
Admission: RE | Admit: 2024-05-09 | Discharge: 2024-05-09 | Disposition: A | Discharge status: Home / Self Care | Source: Ambulatory Visit | Attending: Gastroenterology | Admitting: Gastroenterology

## 2024-05-09 DIAGNOSIS — R748 Abnormal levels of other serum enzymes: Secondary | ICD-10-CM | POA: Diagnosis present

## 2024-05-13 ENCOUNTER — Ambulatory Visit: Attending: Medical | Admitting: Medical

## 2024-05-13 NOTE — Progress Notes (Deleted)
  Cardiology Office Note   Date:  05/13/2024  ID:  Russell Hansen, DOB 08/20/1981, MRN [id number] PCP: Steva Clotilda DEL, NP  [facility] Providers Cardiologist:  None { Click to update primary MD,subspecialty MD or APP then REFRESH:1}    History of Present Illness Russell Hansen is a 43 y.o. male with a history of alcohol use, asthma, GERD who is being seen as a new patient for chest pain.  Patient was seen in the ER 04/27/2024 reporting chest pain.  Chest pain occurs mostly when he tries to eat.  Heart rate 111 bpm, otherwise vitals normal.  Labs showed elevated AST/ALT.  Hemoglobin 12.9.  EKG showed sinus tachycardia with nonspecific T wave changes.  Chest x-ray negative.  Patient was given Maalox and lidocaine  solution with improvement.  He was discharged with a PPI.  Patient saw his PCP 04/30/2024 and was tachycardic started on propranolol.  Patient then saw neurology 05/01/2024 and was started on Lexapro for anxiety.  Patient was previously consuming 6 drinks daily of beer and liquor.  Currently going to [employer]  Today,  ROS: ***  Studies Reviewed      *** Risk Assessment/Calculations {Does this patient have ATRIAL FIBRILLATION?:[id number]} No BP recorded.  {Refresh Note OR Click here to enter BP  :1}***       Physical Exam VS:  There were no vitals taken for this visit.       Wt Readings from Last 3 Encounters:  04/27/24 140 lb (63.5 kg)  04/24/24 145 lb (65.8 kg)  01/03/24 140 lb (63.5 kg)    GEN: Well nourished, well developed in no acute distress NECK: No JVD; No carotid bruits CARDIAC: ***RRR, no murmurs, rubs, gallops RESPIRATORY:  Clear to auscultation without rales, wheezing or rhonchi  ABDOMEN: Soft, non-tender, non-distended EXTREMITIES:  No edema; No deformity   ASSESSMENT AND PLAN ***    {Are you ordering a CV Procedure (e.g. stress test, cath, DCCV, TEE, etc)?   Press F2        :[id number]}  Dispo: ***  Signed, Cerrone Debold DEL Fishman,  PA-C

## 2024-05-15 ENCOUNTER — Emergency Department
Admission: EM | Admit: 2024-05-15 | Discharge: 2024-05-15 | Disposition: A | Discharge status: Home / Self Care | Attending: Emergency Medicine | Admitting: Emergency Medicine

## 2024-05-15 ENCOUNTER — Emergency Department

## 2024-05-15 ENCOUNTER — Other Ambulatory Visit: Payer: Self-pay

## 2024-05-15 DIAGNOSIS — S62336A Displaced fracture of neck of fifth metacarpal bone, right hand, initial encounter for closed fracture: Secondary | ICD-10-CM | POA: Insufficient documentation

## 2024-05-15 DIAGNOSIS — Z79899 Other long term (current) drug therapy: Secondary | ICD-10-CM | POA: Diagnosis not present

## 2024-05-15 DIAGNOSIS — J45909 Unspecified asthma, uncomplicated: Secondary | ICD-10-CM | POA: Insufficient documentation

## 2024-05-15 DIAGNOSIS — S62339A Displaced fracture of neck of unspecified metacarpal bone, initial encounter for closed fracture: Secondary | ICD-10-CM

## 2024-05-15 DIAGNOSIS — I10 Essential (primary) hypertension: Secondary | ICD-10-CM | POA: Diagnosis not present

## 2024-05-15 DIAGNOSIS — S62334A Displaced fracture of neck of fourth metacarpal bone, right hand, initial encounter for closed fracture: Secondary | ICD-10-CM | POA: Insufficient documentation

## 2024-05-15 DIAGNOSIS — W228XXA Striking against or struck by other objects, initial encounter: Secondary | ICD-10-CM | POA: Insufficient documentation

## 2024-05-15 DIAGNOSIS — S6991XA Unspecified injury of right wrist, hand and finger(s), initial encounter: Secondary | ICD-10-CM | POA: Diagnosis present

## 2024-05-15 DIAGNOSIS — S92514A Nondisplaced fracture of proximal phalanx of right lesser toe(s), initial encounter for closed fracture: Secondary | ICD-10-CM | POA: Diagnosis not present

## 2024-05-15 MED ORDER — IBUPROFEN 800 MG PO TABS: 800.0000 mg | ORAL_TABLET | Freq: Three times a day (TID) | ORAL | 0 refills | Status: AC | PRN

## 2024-05-15 MED ORDER — ACETAMINOPHEN 500 MG PO TABS: 1000.0000 mg | ORAL_TABLET | Freq: Four times a day (QID) | ORAL | 0 refills | Status: DC | PRN

## 2024-05-15 MED ORDER — IBUPROFEN 800 MG PO TABS
800.0000 mg | ORAL_TABLET | Freq: Once | ORAL | Status: AC
  Administered 2024-05-15: 800 mg via ORAL
  Filled 2024-05-15: qty 1

## 2024-05-15 NOTE — ED Provider Notes (Signed)
 [facility] Provider Note    Event Date/Time   First MD Initiated Contact with Patient 05/15/24 [phone]     (approximate)   History   Hand Injury and Toe Injury   HPI  Russell Hansen is a 43 y.o. male with history of asthma, hypertension, alcohol use disorder who presents to the emergency department with right hand injury that occurred after punching a headboard and a right toe injury that occurred after stubbing his toe several days ago.  No numbness, weakness.  Able to ambulate.   History provided by patient.    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   Vitamin D deficiency 09/04/2017    Past Surgical History:  Procedure Laterality Date   CHONDROPLASTY Right 03/05/2018   Procedure: CHONDROPLASTY;  Surgeon: Tobie Priest, MD;  Location: [facility] ORS;  Service: Orthopedics;  Laterality: Right;   EYE SURGERY     KNEE ARTHROSCOPY WITH MENISCAL REPAIR Right 03/05/2018   Procedure: KNEE ARTHROSCOPY WITH MENISCAL REPAIR;  Surgeon: Tobie Priest, MD;  Location: [facility] ORS;  Service: Orthopedics;  Laterality: Right;   KNEE SURGERY Bilateral 2003, 2013   ACL REPAIR   MOUTH SURGERY     dental implant   PROSTATE BIOPSY N/A 09/02/2021   Procedure: PROSTATE BIOPSY GRAYCE;  Surgeon: Kassie Ozell SAUNDERS, MD;  Location: [facility] ORS;  Service: Urology;  Laterality: N/A;   WISDOM TOOTH EXTRACTION      MEDICATIONS:  Prior to Admission medications   Medication Sig Start Date  End Date Taking? Authorizing Provider  acetaminophen  (TYLENOL ) 500 MG tablet Take 1,000 mg by mouth every 6 (six) hours as needed for moderate pain. Patient not taking: Reported on 12/31/2023    [provider]  acetaminophen  (TYLENOL ) 500 MG tablet Take 2 tablets (1,000 mg total) by mouth every 8 (eight) hours. Patient not taking: Reported on 12/31/2023 08/01/23 07/31/24  Tobie Priest, MD  albuterol  (PROVENTIL  HFA;VENTOLIN  HFA) 108 (90 Base) MCG/ACT inhaler Inhale 2 puffs into the lungs every 6 (six) hours as needed for wheezing or shortness of breath. Patient not taking: Reported on 12/31/2023 03/05/18   Tobie Priest, MD  amLODipine (NORVASC) 10 MG tablet Take 10 mg by mouth every morning.    [provider]  benzonatate  (TESSALON  PERLES) 100 MG capsule Take 1 capsule (100 mg total) by mouth 3 (three) times daily as needed for cough. Patient not taking: Reported on 12/31/2023 07/14/23 07/13/24  Suzanne Kirsch, MD  Cyanocobalamin  (B-12 PO) Take 1 tablet by mouth daily.    [provider]  folic acid  (FOLVITE ) 1 MG tablet Take 1 tablet (1 mg total) by mouth daily. 07/14/23 07/13/24  Suzanne Kirsch, MD  gabapentin  (NEURONTIN ) 100 MG capsule Take 1 capsule (100 mg total) by mouth 3 (three) times daily. 01/03/24 01/02/25  Arlander Charleston, MD  Multiple Vitamins-Minerals (MULTIVITAMIN WITH MINERALS) tablet Take 1 tablet by mouth daily.  [provider]  ondansetron  (ZOFRAN -ODT) 4 MG disintegrating tablet Take 1 tablet (4 mg total) by mouth every 8 (eight) hours as needed for nausea or vomiting. 12/31/23   Corlis Burnard DEL, NP  pantoprazole  (PROTONIX ) 20 MG tablet Take 1 tablet (20 mg total) by mouth daily. 04/27/24 04/27/25  Waymond Lorelle Cummins, MD  potassium chloride  SA (KLOR-CON  M) 20 MEQ tablet Take 1 tablet (20 mEq total) by mouth 2 (two) times daily. 01/03/24   Arlander Charleston, MD  sildenafil (VIAGRA) 100 MG tablet Take 100 mg by mouth daily as needed for erectile dysfunction.     [provider]  thiamine  (VITAMIN B1) 100 MG tablet Take 1 tablet (100 mg total) by mouth daily. 07/14/23 07/13/24  Suzanne Kirsch, MD  traMADol  (ULTRAM ) 50 MG tablet Take 1 tablet (50 mg total) by mouth every 4 (four) hours as needed. Patient not taking: Reported on 12/31/2023 08/01/23 07/31/24  Tobie Priest, MD  VITAMIN D PO Take 1 tablet by mouth daily.    [provider]    Physical Exam   Triage Vital Signs: ED Triage Vitals  Encounter Vitals Group     BP 05/15/24 0409 (!) 132/97     Girls Systolic BP Percentile --      Girls Diastolic BP Percentile --      Boys Systolic BP Percentile --      Boys Diastolic BP Percentile --      Pulse Rate 05/15/24 0409 (!) 122     Resp 05/15/24 0409 18     Temp 05/15/24 0409 97.8 F (36.6 C)     Temp Source 05/15/24 0409 Oral     SpO2 05/15/24 0409 99 %     Weight 05/15/24 0406 145 lb (65.8 kg)     Height 05/15/24 0406 5' 8 (1.727 m)     Head Circumference --      Peak Flow --      Pain Score 05/15/24 0407 10     Pain Loc --      Pain Education --      Exclude from Growth Chart --     Most recent vital signs: Vitals:   05/15/24 0409  BP: (!) 132/97  Pulse: (!) 122  Resp: 18  Temp: 97.8 F (36.6 C)  SpO2: 99%     CONSTITUTIONAL: Alert and responds appropriately to questions. Well-appearing; well-nourished HEAD: Normocephalic, atraumatic EYES: Conjunctivae clear, pupils appear equal ENT: normal nose; moist mucous membranes NECK: Normal range of motion CARD: Regular rate and rhythm RESP: Normal chest excursion without splinting or tachypnea; no hypoxia or respiratory distress, speaking full sentences ABD/GI: non-distended EXT: Normal ROM in all joints, no major deformities noted; right hand is significantly swollen over the fifth metacarpal with tenderness in this area and difficulty fully extending the 4th and 5th digits.  He has normal capillary refill and 2+ right radial pulse.  Patient also has tenderness and bruising  noted to the right fifth toe and proximal metatarsal without soft tissue swelling or deformity.  2+ right DP pulse.  Compartments soft. SKIN: Normal color for age and race, no rashes on exposed skin NEURO: Moves all extremities equally, normal speech, ambulates with normal gait PSYCH: The patient's mood and manner are appropriate. Grooming and personal hygiene are appropriate.  ED Results / Procedures / Treatments   LABS: (all labs ordered are listed, but only abnormal results are displayed) Labs Reviewed - No data to display   EKG:  EKG Interpretation Date/Time:  Ventricular Rate:    PR Interval:    QRS Duration:    QT Interval:    QTC Calculation:   R Axis:      Text Interpretation:            RADIOLOGY: My personal review and interpretation of imaging: X-ray shows boxer fracture.  Patient has a right proximal toe fracture.   I have personally reviewed all radiology reports. DG Hand Complete Right Result Date: 05/15/2024 CLINICAL DATA:  Punching injury yesterday with continued pain and swelling right hand, also stubbed his fifth toe 3-4 days ago and has been having pain since then. EXAM: RIGHT HAND - COMPLETE 3+ VIEW; RIGHT FIFTH TOE COMPARISON:  None. FINDINGS: Right hand three views: There are acute comminuted fractures of the distal shafts of the fourth and fifth metacarpals. There is moderate ventral and mild radial angulation of the distal fracture fragments and slight spreading of the comminution fragments at the fracture margins. There is normal bone mineralization without further fractures. Arthritic changes are not seen. There is moderate swelling over the dorsal metacarpals greater toward the ulnar aspect. Right great toe, three views: There is soft tissue swelling along the fifth toe and an acute or very recent nondisplaced oblique mid to distal shaft fracture of its proximal phalanx. There is no articular surface involvement. There are no further fractures. 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 There is a healed nondisplaced fracture of the distal shaft of the fifth metatarsal also noted, with mature bridging callus. The visualized metatarsals and phalanges are mildly demineralized, unusual in a male of this age. Arthritic changes are not seen. IMPRESSION: 1. Acute comminuted fractures of the distal shafts of the fourth and fifth metacarpals with moderate ventral and mild radial angulation of the distal fracture fragments. 2. Acute or very recent nondisplaced oblique mid to distal shaft fracture of the proximal phalanx of the fifth toe. 3. Healed nondisplaced fracture of the distal shaft of the fifth metatarsal. 4. Mild demineralization in the visualized right foot, unusual in a male of this age. Electronically Signed   By: Francis Quam M.D.   On: 05/15/2024 04:58   DG Toe 5th Right Result Date: 05/15/2024 CLINICAL DATA:  Punching injury yesterday with continued pain and swelling right hand, also stubbed his fifth toe 3-4 days ago and has been having pain since then. EXAM: RIGHT HAND - COMPLETE 3+ VIEW; RIGHT FIFTH TOE COMPARISON:  None. FINDINGS: Right hand three views: There are acute comminuted fractures of the distal shafts of the fourth and fifth metacarpals. There is moderate ventral and mild radial angulation of the distal fracture fragments and slight spreading of the comminution fragments at the fracture margins. There is normal bone mineralization without further fractures. Arthritic changes are not seen. There is moderate swelling over the dorsal metacarpals greater toward the ulnar aspect. Right great toe, three views: There is soft tissue swelling along the fifth toe and an acute or very recent nondisplaced oblique mid to distal shaft fracture of its proximal phalanx. There is no articular surface involvement. There are no further fractures. The middle and distal phalanges of this toe are congenitally fused in  this patient. There is a healed nondisplaced fracture of the distal shaft of the fifth metatarsal also noted, with mature bridging callus. The visualized metatarsals and phalanges are mildly demineralized, unusual in a male of this age. Arthritic changes are not seen. IMPRESSION: 1. Acute comminuted fractures of the  distal shafts of the fourth and fifth metacarpals with moderate ventral and mild radial angulation of the distal fracture fragments. 2. Acute or very recent nondisplaced oblique mid to distal shaft fracture of the proximal phalanx of the fifth toe. 3. Healed nondisplaced fracture of the distal shaft of the fifth metatarsal. 4. Mild demineralization in the visualized right foot, unusual in a male of this age. Electronically Signed   By: Francis Quam M.D.   On: 05/15/2024 04:58     PROCEDURES:  SPLINT APPLICATION Date/Time: 5:17 AM Authorized by: Josette Earnestene Angello Consent: Verbal consent obtained. Risks and benefits: risks, benefits and alternatives were discussed Consent given by: patient Splint applied by: technician Location details: right hand Splint type: ulnar gutter  Supplies used: ortho glass Post-procedure: The splinted body part was neurovascularly unchanged following the procedure. Patient tolerance: Patient tolerated the procedure well with no immediate complications.    Procedures    IMPRESSION / MDM / ASSESSMENT AND PLAN / ED COURSE  I reviewed the triage vital signs and the nursing notes.   Patient here with right hand and right foot pain.     DIFFERENTIAL DIAGNOSIS (includes but not limited to):   Contusion, sprain, fracture  Patient's presentation is most consistent with acute complicated illness / injury requiring diagnostic workup.  PLAN: Will obtain x-rays.  Will provide with pain medication.  Neurovascular intact distally.   MEDICATIONS GIVEN IN ED: Medications  ibuprofen  (ADVIL ) tablet 800 mg (800 mg Oral Given 05/15/24 0441)     ED COURSE:  X-rays reviewed and interpreted by myself and the radiologist.  Patient does have a right fourth and fifth distal metacarpal fracture.  Will place him in an ulnar gutter splint.  X-ray of the toe shows nondisplaced fracture of the proximal fifth toe.  There is also a old fracture to the metatarsal.  Have offered postoperative shoe which he declines.  Discussed with him that he can weight-bear as tolerated.  Recommended rest, elevation, ice and orthopedic follow-up.  Recommended Tylenol , Motrin .  He states he is not able to take any narcotic pain medication due to severe side effects and declines any prescription today for narcotics.   At this time, I do not feel there is any life-threatening condition present. I reviewed all nursing notes, vitals, pertinent previous records.  All lab and urine results, EKGs, imaging ordered have been independently reviewed and interpreted by myself.  I reviewed all available radiology reports from any imaging ordered this visit.  Based on my assessment, I feel the patient is safe to be discharged home without further emergent workup and can continue workup as an outpatient as needed. Discussed all findings, treatment plan as well as usual and customary return precautions.  They verbalize understanding and are comfortable with this plan.  Outpatient follow-up has been provided as needed.  All questions have been answered.    CONSULTS:  none   OUTSIDE RECORDS REVIEWED: Reviewed last family medicine visit in April 2025.     FINAL CLINICAL IMPRESSION(S) / ED DIAGNOSES   Final diagnoses:  Closed boxer's fracture, initial encounter  Nondisplaced fracture of proximal phalanx of right lesser toe(s), initial encounter for closed fracture     Rx / DC Orders   ED Discharge Orders          Ordered    ibuprofen  (ADVIL ) 800 MG tablet  Every 8 hours PRN        05/15/24 0508    acetaminophen  (TYLENOL ) 500 MG tablet  Every 6  hours PRN        05/15/24 9491              Note:  This document was prepared using Dragon voice recognition software and may include unintentional dictation errors.   Rayola Everhart, Josette SAILOR, DO 05/15/24 [phone]

## 2024-05-15 NOTE — Discharge Instructions (Addendum)
 [facility]

## 2024-05-15 NOTE — ED Triage Notes (Signed)
 Pt to ED via POV c/o right hand injury and right toe injury. Pt punched headboard yesterday and has been having pain and swelling in that hand. Pt also says he stubbed his toe 3-4 days ago and has been having pain since.

## 2024-05-16 ENCOUNTER — Ambulatory Visit (HOSPITAL_COMMUNITY)
Admission: EM | Admit: 2024-05-16 | Discharge: 2024-05-17 | Disposition: A | Discharge status: Other Acute Inpatient Hospital

## 2024-05-16 DIAGNOSIS — R9431 Abnormal electrocardiogram [ECG] [EKG]: Secondary | ICD-10-CM | POA: Insufficient documentation

## 2024-05-16 DIAGNOSIS — S62306A Unspecified fracture of fifth metacarpal bone, right hand, initial encounter for closed fracture: Secondary | ICD-10-CM | POA: Insufficient documentation

## 2024-05-16 DIAGNOSIS — F10129 Alcohol abuse with intoxication, unspecified: Secondary | ICD-10-CM | POA: Diagnosis not present

## 2024-05-16 DIAGNOSIS — F10120 Alcohol abuse with intoxication, uncomplicated: Secondary | ICD-10-CM | POA: Insufficient documentation

## 2024-05-16 DIAGNOSIS — F10132 Alcohol abuse with withdrawal with perceptual disturbance: Secondary | ICD-10-CM | POA: Insufficient documentation

## 2024-05-16 DIAGNOSIS — S62304A Unspecified fracture of fourth metacarpal bone, right hand, initial encounter for closed fracture: Secondary | ICD-10-CM | POA: Insufficient documentation

## 2024-05-16 DIAGNOSIS — S92516A Nondisplaced fracture of proximal phalanx of unspecified lesser toe(s), initial encounter for closed fracture: Secondary | ICD-10-CM | POA: Insufficient documentation

## 2024-05-16 DIAGNOSIS — W2203XA Walked into furniture, initial encounter: Secondary | ICD-10-CM | POA: Insufficient documentation

## 2024-05-16 DIAGNOSIS — Z046 Encounter for general psychiatric examination, requested by authority: Secondary | ICD-10-CM | POA: Diagnosis not present

## 2024-05-16 DIAGNOSIS — Y906 Blood alcohol level of 120-199 mg/100 ml: Secondary | ICD-10-CM | POA: Insufficient documentation

## 2024-05-16 DIAGNOSIS — F10230 Alcohol dependence with withdrawal, uncomplicated: Secondary | ICD-10-CM | POA: Diagnosis not present

## 2024-05-16 DIAGNOSIS — R45851 Suicidal ideations: Secondary | ICD-10-CM | POA: Insufficient documentation

## 2024-05-16 DIAGNOSIS — R4182 Altered mental status, unspecified: Secondary | ICD-10-CM | POA: Diagnosis not present

## 2024-05-16 MED ORDER — MAGNESIUM HYDROXIDE 400 MG/5ML PO SUSP: 30.0000 mL | Freq: Every day | ORAL | Status: DC | PRN

## 2024-05-16 MED ORDER — OLANZAPINE 10 MG IM SOLR: 10.0000 mg | Freq: Three times a day (TID) | INTRAMUSCULAR | Status: DC | PRN

## 2024-05-16 MED ORDER — OLANZAPINE 5 MG PO TBDP: 5.0000 mg | ORAL_TABLET | Freq: Three times a day (TID) | ORAL | Status: DC | PRN

## 2024-05-16 MED ORDER — OLANZAPINE 10 MG IM SOLR: 5.0000 mg | Freq: Three times a day (TID) | INTRAMUSCULAR | Status: DC | PRN

## 2024-05-16 MED ORDER — ALUM & MAG HYDROXIDE-SIMETH 200-200-20 MG/5ML PO SUSP: 30.0000 mL | ORAL | Status: DC | PRN

## 2024-05-16 NOTE — Progress Notes (Signed)
   05/16/24 2140  [facility] Triage Screening (Walk-ins at [facility] only)  How Did You Hear About Us ? Legal System  What Is the Reason for Your Visit/Call Today? Russell Hansen is a 43 year old male who presents to [facility] escorted by [employer] under IVC due to SI with a plan to shoot himself. Per [employer] the patients family called  reporting that he had a gun and threatened SI. The family reported that they attempted to take the gun away but the patient was able to obtain it again and went into his room. Police reported they arrived and were able to retrieve the gun from the patient. Patient states he was feeling suicidal and did endorse having a gun. He states I told my wife to take the kids and leave. He states he went into his room with the gun with intentions to harm himself. He states I didn't want to hurt anybody else just myself. He reports stress due to ongoing marital issues with his wife. He reports 2 days ago he became upset and punched a headboard and broke his hand. He is observed with a swollen and bruised right hand. He states he is expected to get surgery on his hand on Monday. He reports hx of alcoholism, last drink was today 1 shot around 10am. 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 He reports going to rehab for 21 days from March to April and reports he relapsed in May. He reports typically drinking 6-8 shots of liquor daily. He denies any other substance abuse. Pt denies HI and AVH  How Long Has This Been Causing You Problems? > than 6 months  Have You Recently Had Any Thoughts About Hurting Yourself? Yes  How long ago did you have thoughts about hurting yourself? today  Are You Planning to Commit Suicide/Harm Yourself At This time? Yes  Have you Recently Had Thoughts About Hurting Someone Sherral? No  Are You Planning  To Harm Someone At This Time? No  Physical Abuse Denies  Verbal Abuse Denies  Sexual Abuse Denies  Exploitation of patient/patient's resources Denies  Self-Neglect Denies  Possible abuse reported to: Other (Comment) (n/a)  Are you currently experiencing any auditory, visual or other hallucinations? No  Have You Used Any Alcohol or Drugs in the Past 24 Hours? Yes  What Did You Use and How Much? 1 shot of liquor today  Do you have any current medical co-morbidities that require immediate attention? No  Clinician description of patient physical appearance/behavior: anxious, irritable, casually dressed, suspected of being intoxicated  What Do You Feel Would Help You the Most Today? Alcohol or Drug Use Treatment;Treatment for Depression or other mood problem  If access to [facility] was not available, would you have sought care in the Emergency Department? Yes  Determination of Need Emergent (2 hours)  Options For Referral Inpatient Hospitalization  Determination of Need filed? Yes

## 2024-05-16 NOTE — ED Provider Notes (Signed)
 [facility] Continuous Assessment Admission H&P  Date: 05/16/24 Patient Name: Russell Hansen MRN: [id number] Chief Complaint: Involuntary committed  Diagnoses:  Final diagnoses:  Involuntary commitment  Suicidal ideation  Alcohol abuse with intoxication (HCC)    HPI: Russell Hansen, 43 y/o male with a history of alcohol abuse, generalized anxiety assented to [facility] via [employer], under IVC.  Review of patient records show that patient was seen in the ED yesterday due to injuring his hand after punching headboard.   Per the IVC respondents had a gun this evening, respondents threatened to kill himself and Vila had to take the gun from the respondent.  Respondents appeared to be hostile with family members, respondents is abusing alcohol and is throwing up frequently, respondents is a danger to self respondents is a danger to others.  Face-to-face observation of patient, patient is alert and oriented x 4, speech is clear however patient can become very angry and blunted and agitated easily.  Patient obviously seems to be under the influence of alcohol.  Patient when asked if he was suicidal stating today he told the wife to get the kids out of the house and he took the guns that he was going to kill himself.  Per the patient the police came and took the gun from him.  Patient denies HI, AVH or paranoia.  Reports he drinks 6-8 shots daily however he said he had around 4 shots today.  According to patient he had relapsed because he did rehab in [place] for 21 days.  According to patient he does have a surgery appointment on Monday to have his hand repair. Patient is very belligerent and angry and stating that he is not staying here he wants to go home.  Writer discussed and explained to patient that at this present moment he cannot go home because he is under IVC.  However patient is still adamant and refused to listen.    First exam completed,  recommend inpatient,  IVC upheld Recommend  inpatient when a bed become available for now will put patient in  observation unit with further assessment in the a.m.  Total Time spent with patient: 20 minutes  Musculoskeletal  Strength & Muscle Tone: within normal limits Gait & Station: normal Patient leans: N/A  Psychiatric Specialty Exam  Presentation General Appearance:  Casual  Eye Contact: Fair  Speech: Clear and Coherent  Speech Volume: Increased  Handedness: Right   Mood and Affect  Mood: Angry  Affect: Blunt   Thought Process  Thought Processes: Coherent  Descriptions of Associations:Intact  Orientation:Full (Time, Place and Person)  Thought Content:Scattered    Hallucinations:Hallucinations: None  Ideas of Reference:None  Suicidal Thoughts:Suicidal Thoughts: Yes, Active SI Active Intent and/or Plan: With Intent; With Plan  Homicidal Thoughts:Homicidal Thoughts: No   Sensorium  Memory: Immediate Fair  Judgment: Impaired  Insight: Poor   Executive Functions  Concentration: Fair  Attention Span: Fair  Recall: [employer] of Knowledge: Fair  Language: Fair   Psychomotor Activity  Psychomotor Activity: Psychomotor Activity: Normal   Assets  Assets: Desire for Improvement; Resilience   Sleep  Sleep: Sleep: Fair Number of Hours of Sleep: 4   Nutritional Assessment (For OBS and FBC admissions only) Has the patient had a weight loss or gain of 10 pounds or more in the last 3 months?: No Has the patient had a decrease in food intake/or appetite?: No Does the patient have dental problems?: No Does the patient have eating habits or behaviors  that may be indicators of an eating disorder including binging or inducing vomiting?: No Has the patient recently lost weight without trying?: 0 Has the patient been eating poorly because of a decreased appetite?: 0 Malnutrition Screening Tool Score: 0    Physical Exam HENT:     Head: Normocephalic.     Nose: Nose  normal.  Eyes:     Pupils: Pupils are equal, round, and reactive to light.  Cardiovascular:     Rate and Rhythm: Normal rate.  Pulmonary:     Effort: Pulmonary effort is normal.  Musculoskeletal:        General: Normal range of motion.     Cervical back: Normal range of motion.  Neurological:     General: No focal deficit present.     Mental Status: He is alert.  Psychiatric:        Mood and Affect: Mood normal.        Behavior: Behavior normal.        Thought Content: Thought content normal.        Judgment: Judgment normal.    Review of Systems  Constitutional: Negative.   HENT: Negative.    Eyes: Negative.   Respiratory: Negative.    Cardiovascular: Negative.   Gastrointestinal: Negative.   Genitourinary: Negative.   Musculoskeletal: Negative.   Skin: Negative.   Neurological: Negative.   Psychiatric/Behavioral:  Positive for substance abuse and suicidal ideas.     Blood pressure (!) 120/98, pulse 70, temperature 98.4 F (36.9 C), temperature source Oral, resp. rate 19, SpO2 97%. There is no height or weight on file to calculate BMI.  Past Psychiatric History: Alcohol abuse, GAD  Is the patient at risk to self? Yes  Has the patient been a risk to self in the past 6 months? Yes .    Has the patient been a risk to self within the distant past? Yes   Is the patient a risk to others? No   Has the patient been a risk to others in the past 6 months? No   Has the patient been a risk to others within the distant past? No   Past Medical History: See chart  Family History: Unknown  Social History: Alcohol abuse  Last Labs:  Admission on 04/27/2024, Discharged on 04/27/2024  Component Date Value Ref Range Status   Sodium 04/27/2024 134 (L)  135 - 145 mmol/L Final   Potassium 04/27/2024 4.2  3.5 - 5.1 mmol/L Final   Chloride 04/27/2024 101  98 - 111 mmol/L Final   CO2 04/27/2024 24  22 - 32 mmol/L Final   Glucose, Bld 04/27/2024 109 (H)  70 - 99 mg/dL Final    Glucose reference range applies only to samples taken after fasting for at least 8 hours.   BUN 04/27/2024 11  6 - 20 mg/dL Final   Creatinine, Ser 04/27/2024 0.70  0.61 - 1.24 mg/dL Final   Calcium 92/94/7974 9.1  8.9 - 10.3 mg/dL Final   GFR, Estimated 04/27/2024 >60  >60 mL/min Final   Comment: (NOTE) Calculated using the CKD-EPI Creatinine Equation (2021)    Anion gap 04/27/2024 9  5 - 15 Final   Performed at [facility], [address] [place], [place] [address]   WBC 04/27/2024 6.3  4.0 - 10.5 K/uL Final   RBC 04/27/2024 3.77 (L)  4.22 - 5.81 MIL/uL Final   Hemoglobin 04/27/2024 12.9 (L)  13.0 - 17.0 g/dL Final   HCT 92/94/7974 36.8 (L)  39.0 - 52.0 % Final   MCV 04/27/2024 97.6  80.0 - 100.0 fL Final   MCH 04/27/2024 34.2 (H)  26.0 - 34.0 pg Final   MCHC 04/27/2024 35.1  30.0 - 36.0 g/dL Final   RDW 92/94/7974 14.0  11.5 - 15.5 % Final   Platelets 04/27/2024 198  150 - 400 K/uL Final   nRBC 04/27/2024 0.0  0.0 - 0.2 % Final   Performed at [facility], [address] [place], [place] [address]   Troponin I (High Sensitivity) 04/27/2024 6  <18 ng/L Final   Comment: (NOTE) Elevated high sensitivity troponin I (hsTnI) values and significant  changes across serial measurements may suggest ACS but many other  chronic and acute conditions are known to elevate hsTnI results.  Refer to the Links section for chest pain algorithms and additional  guidance. Performed at [facility], [address] [place], [place] [address]    Total Protein 04/27/2024 6.7  6.5 - 8.1 g/dL Final   Albumin 92/94/7974 4.1  3.5 - 5.0 g/dL Final   AST 92/94/7974 323 (H)  15 - 41 U/L Final   ALT 04/27/2024 121 (H)  0 - 44 U/L Final   Alkaline Phosphatase 04/27/2024 75  38 - 126 U/L Final   Total Bilirubin 04/27/2024 0.4  0.0 - 1.2 mg/dL Final   Bilirubin, Direct 04/27/2024 <0.1  0.0 - 0.2 mg/dL Final   Indirect Bilirubin 04/27/2024 NOT CALCULATED  0.3 - 0.9 mg/dL Final    Performed at [facility], [address] [place], [place] [address]   Lipase 04/27/2024 41  11 - 51 U/L Final   Performed at [facility], [address] [place], [place] [address]   Troponin I (High Sensitivity) 04/27/2024 7  <18 ng/L Final   Comment: (NOTE) Elevated high sensitivity troponin I (hsTnI) values and significant  changes across serial measurements may suggest ACS but many other  chronic and acute conditions are known to elevate hsTnI results.  Refer to the Links section for chest pain algorithms and additional  guidance. Performed at [facility], [address] [place], [place] [address]   Admission on 04/24/2024, Discharged on 04/25/2024  Component Date Value Ref Range Status   Sodium 04/24/2024 134 (L)  135 - 145 mmol/L Final   Potassium 04/24/2024 3.2 (L)  3.5 - 5.1 mmol/L Final   Chloride 04/24/2024 94 (L)  98 - 111 mmol/L Final   CO2 04/24/2024 26  22 - 32 mmol/L Final   Glucose, Bld 04/24/2024 111 (H)  70 - 99 mg/dL Final   Glucose reference range applies only to samples taken after fasting for at least 8 hours.   BUN 04/24/2024 16  6 - 20 mg/dL Final   Creatinine, Ser 04/24/2024 0.79  0.61 - 1.24 mg/dL Final   Calcium 92/97/7974 9.5  8.9 - 10.3 mg/dL Final   Total Protein 92/97/7974 6.8  6.5 - 8.1 g/dL Final   Albumin 92/97/7974 4.1  3.5 - 5.0 g/dL Final   AST 92/97/7974 288 (H)  15 - 41 U/L Final   ALT 04/24/2024 122 (H)  0 - 44 U/L Final   Alkaline Phosphatase 04/24/2024 80  38 - 126 U/L Final   Total Bilirubin 04/24/2024 0.5  0.0 - 1.2 mg/dL Final   GFR, Estimated 04/24/2024 >60  >60 mL/min Final   Comment: (NOTE) Calculated using the CKD-EPI Creatinine Equation (2021)    Anion gap 04/24/2024 14  5 - 15 Final  Performed at [facility], [address] [place], [place] [address]   Alcohol, Ethyl (B) 04/24/2024 102 (H)  <15 mg/dL Final   Comment: (NOTE) For medical purposes only. Performed at [facility], [address] [place] [address] [place], [place] [address]    WBC 04/24/2024 7.2  4.0 - 10.5 K/uL Final   RBC 04/24/2024 3.89 (L)  4.22 - 5.81 MIL/uL Final   Hemoglobin 04/24/2024 13.2  13.0 - 17.0 g/dL Final   HCT 92/97/7974 37.4 (L)  39.0 - 52.0 % Final   MCV 04/24/2024 96.1  80.0 - 100.0 fL Final   MCH 04/24/2024 33.9  26.0 - 34.0 pg Final   MCHC 04/24/2024 35.3  30.0 - 36.0 g/dL Final   RDW 92/97/7974 13.7  11.5 - 15.5 % Final   Platelets 04/24/2024 185  150 - 400 K/uL Final   nRBC 04/24/2024 0.0  0.0 - 0.2 % Final   Performed at [facility], [address] [place], [place] [address]   Tricyclic, Ur Screen 04/24/2024 POSITIVE (A)  NONE DETECTED Final   Amphetamines, Ur Screen 04/24/2024 NONE DETECTED  NONE DETECTED Final   MDMA (Ecstasy)Ur Screen 04/24/2024 NONE DETECTED  NONE DETECTED Final   Cocaine Metabolite,Ur [place] 04/24/2024 NONE DETECTED  NONE DETECTED Final   Opiate, Ur Screen 04/24/2024 NONE DETECTED  NONE DETECTED Final   Phencyclidine (PCP) Ur S 04/24/2024 NONE DETECTED  NONE DETECTED Final   Cannabinoid 50 Ng, Ur [place] 04/24/2024 NONE DETECTED  NONE DETECTED Final   Barbiturates, Ur Screen 04/24/2024 NONE DETECTED  NONE DETECTED Final   Benzodiazepine, Ur Scrn 04/24/2024 NONE DETECTED  NONE DETECTED Final   Methadone Scn, Ur 04/24/2024 NONE DETECTED  NONE DETECTED Final   Comment: (NOTE) Tricyclics + metabolites, urine    Cutoff 1000 ng/mL Amphetamines + metabolites, urine  Cutoff 1000 ng/mL MDMA (Ecstasy), urine              Cutoff 500 ng/mL Cocaine Metabolite, urine          Cutoff 300 ng/mL Opiate + metabolites, urine        Cutoff 300 ng/mL Phencyclidine (PCP), urine         Cutoff 25 ng/mL Cannabinoid, urine                 Cutoff 50 ng/mL Barbiturates + metabolites, urine  Cutoff 200 ng/mL Benzodiazepine, urine              Cutoff 200 ng/mL Methadone, urine                   Cutoff 300 ng/mL  The urine drug screen provides only a preliminary,  unconfirmed analytical test result and should not be used for non-medical purposes. Clinical consideration and professional judgment should be applied to any positive drug screen result due to possible interfering substances. A more specific alternate chemical method must be used in order to obtain a confirmed analytical result. Gas chromatography / mass spectrometry (GC/MS) is the preferred confirm                          atory method. Performed at [facility], [address] [place], [place] [address]    Troponin I (High Sensitivity) 04/24/2024 4  <18 ng/L Final   Comment: (NOTE) Elevated high sensitivity troponin I (hsTnI) values and significant  changes across serial measurements may suggest ACS but many other  chronic and acute conditions are known  to elevate hsTnI results.  Refer to the Links section for chest pain algorithms and additional  guidance. Performed at [facility], [address] [place], [place] [address]    Troponin I (High Sensitivity) 04/25/2024 7  <18 ng/L Final   Comment: (NOTE) Elevated high sensitivity troponin I (hsTnI) values and significant  changes across serial measurements may suggest ACS but many other  chronic and acute conditions are known to elevate hsTnI results.  Refer to the Links section for chest pain algorithms and additional  guidance. Performed at [facility], [address] [place], [place] [address]   Admission on 01/03/2024, Discharged on 01/04/2024  Component Date Value Ref Range Status   Sodium 01/03/2024 131 (L)  135 - 145 mmol/L Final   Potassium 01/03/2024 3.8  3.5 - 5.1 mmol/L Final   Chloride 01/03/2024 99  98 - 111 mmol/L Final   CO2 01/03/2024 20 (L)  22 - 32 mmol/L Final   Glucose, Bld 01/03/2024 86  70 - 99 mg/dL Final   Glucose reference range applies only to samples taken after fasting for at least 8 hours.   BUN 01/03/2024 9  6 - 20 mg/dL Final   Creatinine, Ser 01/03/2024  0.77  0.61 - 1.24 mg/dL Final   Calcium 96/87/7974 8.1 (L)  8.9 - 10.3 mg/dL Final   GFR, Estimated 01/03/2024 >60  >60 mL/min Final   Comment: (NOTE) Calculated using the CKD-EPI Creatinine Equation (2021)    Anion gap 01/03/2024 12  5 - 15 Final   Performed at [facility], [address] [place], [place] [address]   Magnesium  01/03/2024 1.4 (L)  1.7 - 2.4 mg/dL Final   Performed at [facility], [address] [place], [place] [address]  Admission on 01/03/2024, Discharged on 01/03/2024  Component Date Value Ref Range Status   Sodium 01/03/2024 133 (L)  135 - 145 mmol/L Final   Potassium 01/03/2024 2.8 (L)  3.5 - 5.1 mmol/L Final   Chloride 01/03/2024 95 (L)  98 - 111 mmol/L Final   CO2 01/03/2024 24  22 - 32 mmol/L Final   Glucose, Bld 01/03/2024 116 (H)  70 - 99 mg/dL Final   Glucose reference range applies only to samples taken after fasting for at least 8 hours.   BUN 01/03/2024 15  6 - 20 mg/dL Final   Creatinine, Ser 01/03/2024 0.94  0.61 - 1.24 mg/dL Final   Calcium 96/87/7974 8.6 (L)  8.9 - 10.3 mg/dL Final   GFR, Estimated 01/03/2024 >60  >60 mL/min Final   Comment: (NOTE) Calculated using the CKD-EPI Creatinine Equation (2021)    Anion gap 01/03/2024 14  5 - 15 Final   Performed at [facility], [address] [place], [place] [address]   WBC 01/03/2024 7.4  4.0 - 10.5 K/uL Final   RBC 01/03/2024 3.52 (L)  4.22 - 5.81 MIL/uL Final   Hemoglobin 01/03/2024 12.7 (L)  13.0 - 17.0 g/dL Final   HCT 96/87/7974 35.1 (L)  39.0 - 52.0 % Final   MCV 01/03/2024 99.7  80.0 - 100.0 fL Final   MCH 01/03/2024 36.1 (H)  26.0 - 34.0 pg Final   MCHC 01/03/2024 36.2 (H)  30.0 - 36.0 g/dL Final   RDW 96/87/7974 12.5  11.5 - 15.5 % Final   Platelets 01/03/2024 220  150 - 400 K/uL Final   nRBC 01/03/2024 0.0  0.0 - 0.2 % Final   Performed at [facility], [address] [place], [place] [address]   Color, Urine 01/03/2024 AMBER (A)  YELLOW Final    BIOCHEMICALS MAY BE AFFECTED BY COLOR   APPearance 01/03/2024 HAZY (A)  CLEAR Final   Specific Gravity, Urine 01/03/2024 1.034 (H)  1.005 - 1.030 Final   pH 01/03/2024 5.0  5.0 - 8.0 Final   Glucose, UA 01/03/2024 NEGATIVE  NEGATIVE mg/dL Final   Hgb urine dipstick 01/03/2024 NEGATIVE  NEGATIVE Final   Bilirubin Urine 01/03/2024 MODERATE (A)  NEGATIVE Final   Ketones, ur 01/03/2024 NEGATIVE  NEGATIVE mg/dL Final   Protein, ur 96/87/7974 100 (A)  NEGATIVE mg/dL Final   Nitrite 96/87/7974 NEGATIVE  NEGATIVE Final   Leukocytes,Ua 01/03/2024 NEGATIVE  NEGATIVE Final   RBC / HPF 01/03/2024 0-5  0 - 5 RBC/hpf Final   WBC, UA 01/03/2024 11-20  0 - 5 WBC/hpf Final   Bacteria, UA 01/03/2024 MANY (A)  NONE SEEN Final   Squamous Epithelial / HPF 01/03/2024 0-5  0 - 5 /HPF Final   Mucus 01/03/2024 PRESENT   Final   Hyaline Casts, UA 01/03/2024 PRESENT   Final   Performed at [facility], [address] [place], [place] [address]   SARS Coronavirus 2 by RT PCR 01/03/2024 NEGATIVE  NEGATIVE Final   Comment: (NOTE) SARS-CoV-2 target nucleic acids are NOT DETECTED.  The SARS-CoV-2 RNA is generally detectable in upper respiratory specimens during the acute phase of infection. The lowest concentration of SARS-CoV-2 viral copies this assay can detect is 138 copies/mL. A negative result does not preclude SARS-Cov-2 infection and should not be used as the sole basis for treatment or other patient management decisions. A negative result may occur with  improper specimen collection/handling, submission of specimen other than nasopharyngeal swab, presence of viral mutation(s) within the areas targeted by this assay, and inadequate number of viral copies(<138 copies/mL). A negative result must be combined with clinical observations, patient history, and epidemiological information. The expected result is Negative.  Fact Sheet for Patients:  BloggerCourse.com  Fact  Sheet for Healthcare Providers:  SeriousBroker.it  This test is no                          t yet approved or cleared by the [place]  FDA and  has been authorized for detection and/or diagnosis of SARS-CoV-2 by FDA under an Emergency Use Authorization (EUA). This EUA will remain  in effect (meaning this test can be used) for the duration of the COVID-19 declaration under Section 564(b)(1) of the Act, 21 U.S.C.section 360bbb-3(b)(1), unless the authorization is terminated  or revoked sooner.       Influenza A by PCR 01/03/2024 NEGATIVE  NEGATIVE Final   Influenza B by PCR 01/03/2024 NEGATIVE  NEGATIVE Final   Comment: (NOTE) The Xpert Xpress SARS-CoV-2/FLU/RSV plus assay is intended as an aid in the diagnosis of influenza from Nasopharyngeal swab specimens and should not be used as a sole basis for treatment. Nasal washings and aspirates are unacceptable for Xpert Xpress SARS-CoV-2/FLU/RSV testing.  Fact Sheet for Patients: BloggerCourse.com  Fact Sheet for Healthcare Providers: SeriousBroker.it  This test is not yet approved or cleared by the [place]  FDA and has been authorized for detection and/or diagnosis of SARS-CoV-2 by FDA under an Emergency Use Authorization (EUA). This EUA will remain in effect (meaning this test can be used) for the duration of the COVID-19 declaration under Section 564(b)(1) of the Act, 21 U.S.C. section 360bbb-3(b)(1), unless the authorization  is terminated or revoked.     Resp Syncytial Virus by PCR 01/03/2024 NEGATIVE  NEGATIVE Final   Comment: (NOTE) Fact Sheet for Patients: BloggerCourse.com  Fact Sheet for Healthcare Providers: SeriousBroker.it  This test is not yet approved or cleared by the [place]  FDA and has been authorized for detection and/or diagnosis of SARS-CoV-2 by FDA under an Emergency  Use Authorization (EUA). This EUA will remain in effect (meaning this test can be used) for the duration of the COVID-19 declaration under Section 564(b)(1) of the Act, 21 U.S.C. section 360bbb-3(b)(1), unless the authorization is terminated or revoked.  Performed at [facility], [address] [place], [place] [address]    Group A Strep by PCR 01/03/2024 NOT DETECTED  NOT DETECTED Final   Performed at [facility], [address] [place], [place] [address]   Total Protein 01/03/2024 6.2 (L)  6.5 - 8.1 g/dL Final   Albumin 96/87/7974 3.5  3.5 - 5.0 g/dL Final   AST 96/87/7974 321 (H)  15 - 41 U/L Final   ALT 01/03/2024 68 (H)  0 - 44 U/L Final   Alkaline Phosphatase 01/03/2024 170 (H)  38 - 126 U/L Final   Total Bilirubin 01/03/2024 1.1  0.0 - 1.2 mg/dL Final   Bilirubin, Direct 01/03/2024 0.5 (H)  0.0 - 0.2 mg/dL Final   Indirect Bilirubin 01/03/2024 0.6  0.3 - 0.9 mg/dL Final   Performed at [facility], [address], [place], [place] [address]  Admission on 12/31/2023, Discharged on 12/31/2023  Component Date Value Ref Range Status   Lipase 12/31/2023 26  11 - 51 U/L Final   Performed at [facility], [address] [place], [place] [address]   Sodium 12/31/2023 132 (L)  135 - 145 mmol/L Final   Potassium 12/31/2023 3.3 (L)  3.5 - 5.1 mmol/L Final   Chloride 12/31/2023 97 (L)  98 - 111 mmol/L Final   CO2 12/31/2023 18 (L)  22 - 32 mmol/L Final   Glucose, Bld 12/31/2023 105 (H)  70 - 99 mg/dL Final   Glucose reference range applies only to samples taken after fasting for at least 8 hours.   BUN 12/31/2023 10  6 - 20 mg/dL Final   Creatinine, Ser 12/31/2023 0.86  0.61 - 1.24 mg/dL Final   Calcium 96/90/7974 9.2  8.9 - 10.3 mg/dL Final   Total Protein 96/90/7974 6.9  6.5 - 8.1 g/dL Final   Albumin 96/90/7974 3.9  3.5 - 5.0 g/dL Final   AST 96/90/7974 189 (H)  15 - 41 U/L Final   ALT 12/31/2023 54 (H)  0 - 44 U/L Final   Alkaline  Phosphatase 12/31/2023 162 (H)  38 - 126 U/L Final   Total Bilirubin 12/31/2023 1.1  0.0 - 1.2 mg/dL Final   GFR, Estimated 12/31/2023 >60  >60 mL/min Final   Comment: (NOTE) Calculated using the CKD-EPI Creatinine Equation (2021)    Anion gap 12/31/2023 17 (H)  5 - 15 Final   Performed at [facility], [address] [place], [place] [address]   WBC 12/31/2023 9.2  4.0 - 10.5 K/uL Final   RBC 12/31/2023 3.76 (L)  4.22 - 5.81 MIL/uL Final   Hemoglobin 12/31/2023 13.6  13.0 - 17.0 g/dL Final   HCT 96/90/7974 37.0 (L)  39.0 - 52.0 % Final   MCV 12/31/2023 98.4  80.0 - 100.0 fL Final   MCH 12/31/2023 36.2 (H)  26.0 - 34.0 pg Final  MCHC 12/31/2023 36.8 (H)  30.0 - 36.0 g/dL Final   RDW 96/90/7974 12.2  11.5 - 15.5 % Final   Platelets 12/31/2023 181  150 - 400 K/uL Final   nRBC 12/31/2023 0.0  0.0 - 0.2 % Final   Performed at [facility], [address] [place], [place] [address]   Color, Urine 12/31/2023 AMBER (A)  YELLOW Final   BIOCHEMICALS MAY BE AFFECTED BY COLOR   APPearance 12/31/2023 CLEAR (A)  CLEAR Final   Specific Gravity, Urine 12/31/2023 1.020  1.005 - 1.030 Final   pH 12/31/2023 5.0  5.0 - 8.0 Final   Glucose, UA 12/31/2023 NEGATIVE  NEGATIVE mg/dL Final   Hgb urine dipstick 12/31/2023 NEGATIVE  NEGATIVE Final   Bilirubin Urine 12/31/2023 NEGATIVE  NEGATIVE Final   Ketones, ur 12/31/2023 NEGATIVE  NEGATIVE mg/dL Final   Protein, ur 96/90/7974 100 (A)  NEGATIVE mg/dL Final   Nitrite 96/90/7974 NEGATIVE  NEGATIVE Final   Leukocytes,Ua 12/31/2023 NEGATIVE  NEGATIVE Final   RBC / HPF 12/31/2023 0  0 - 5 RBC/hpf Final   WBC, UA 12/31/2023 0-5  0 - 5 WBC/hpf Final   Bacteria, UA 12/31/2023 NONE SEEN  NONE SEEN Final   Squamous Epithelial / HPF 12/31/2023 0-5  0 - 5 /HPF Final   Mucus 12/31/2023 PRESENT   Final   Hyaline Casts, UA 12/31/2023 PRESENT   Final   Performed at [facility], [address] [place], [place] [address]  Admission on  12/31/2023, Discharged on 12/31/2023  Component Date Value Ref Range Status   Influenza A Antigen, POC 12/31/2023 Negative  Negative Final   Influenza B Antigen, POC 12/31/2023 Negative  Negative Final   Covid Antigen, POC 12/31/2023 Negative  Negative Final    Allergies: Doxycycline, Norco [hydrocodone -acetaminophen ], and Oxycodone   Medications:  Facility Ordered Medications  Medication   alum & mag hydroxide-simeth (MAALOX/MYLANTA) 200-200-20 MG/5ML suspension 30 mL   magnesium  hydroxide (MILK OF MAGNESIA) suspension 30 mL   OLANZapine  zydis (ZYPREXA ) disintegrating tablet 5 mg   OLANZapine  (ZYPREXA ) injection 5 mg   OLANZapine  (ZYPREXA ) injection 10 mg   PTA Medications  Medication Sig   amLODipine (NORVASC) 10 MG tablet Take 10 mg by mouth every morning.   sildenafil (VIAGRA) 100 MG tablet Take 100 mg by mouth daily as needed for erectile dysfunction.    Multiple Vitamins-Minerals (MULTIVITAMIN WITH MINERALS) tablet Take 1 tablet by mouth daily.   Cyanocobalamin  (B-12 PO) Take 1 tablet by mouth daily.   VITAMIN D PO Take 1 tablet by mouth daily.   folic acid  (FOLVITE ) 1 MG tablet Take 1 tablet (1 mg total) by mouth daily.   thiamine  (VITAMIN B1) 100 MG tablet Take 1 tablet (100 mg total) by mouth daily.   ondansetron  (ZOFRAN -ODT) 4 MG disintegrating tablet Take 1 tablet (4 mg total) by mouth every 8 (eight) hours as needed for nausea or vomiting.   gabapentin  (NEURONTIN ) 100 MG capsule Take 1 capsule (100 mg total) by mouth 3 (three) times daily.   potassium chloride  SA (KLOR-CON  M) 20 MEQ tablet Take 1 tablet (20 mEq total) by mouth 2 (two) times daily.   pantoprazole  (PROTONIX ) 20 MG tablet Take 1 tablet (20 mg total) by mouth daily.   ibuprofen  (ADVIL ) 800 MG tablet Take 1 tablet (800 mg total) by mouth every 8 (eight) hours as needed.   acetaminophen  (TYLENOL ) 500 MG tablet Take 2 tablets (1,000 mg total) by mouth every 6 (six) hours as needed.  Medical Decision  Making  Inpatient unit, patient will stay in observation until a bed becomes available    Recommendations  Based on my evaluation the patient does not appear to have an emergency medical condition.  Gaither Pouch, NP 05/16/24  10:24 PM

## 2024-05-17 ENCOUNTER — Other Ambulatory Visit: Payer: Self-pay

## 2024-05-17 ENCOUNTER — Inpatient Hospital Stay (HOSPITAL_COMMUNITY)
Admission: EM | Admit: 2024-05-17 | Discharge: 2024-05-21 | DRG: 897 | Disposition: A | Discharge status: Other Acute Inpatient Hospital | Attending: Family Medicine | Admitting: Family Medicine

## 2024-05-17 ENCOUNTER — Encounter (HOSPITAL_COMMUNITY): Payer: Self-pay

## 2024-05-17 ENCOUNTER — Encounter (HOSPITAL_COMMUNITY): Payer: Self-pay | Admitting: *Deleted

## 2024-05-17 DIAGNOSIS — F10930 Alcohol use, unspecified with withdrawal, uncomplicated: Principal | ICD-10-CM

## 2024-05-17 DIAGNOSIS — Z8659 Personal history of other mental and behavioral disorders: Secondary | ICD-10-CM | POA: Diagnosis not present

## 2024-05-17 DIAGNOSIS — Z885 Allergy status to narcotic agent status: Secondary | ICD-10-CM | POA: Diagnosis not present

## 2024-05-17 DIAGNOSIS — E559 Vitamin D deficiency, unspecified: Secondary | ICD-10-CM | POA: Diagnosis present

## 2024-05-17 DIAGNOSIS — F329 Major depressive disorder, single episode, unspecified: Secondary | ICD-10-CM | POA: Diagnosis not present

## 2024-05-17 DIAGNOSIS — Z79899 Other long term (current) drug therapy: Secondary | ICD-10-CM | POA: Diagnosis not present

## 2024-05-17 DIAGNOSIS — X58XXXA Exposure to other specified factors, initial encounter: Secondary | ICD-10-CM | POA: Diagnosis present

## 2024-05-17 DIAGNOSIS — K219 Gastro-esophageal reflux disease without esophagitis: Secondary | ICD-10-CM | POA: Diagnosis present

## 2024-05-17 DIAGNOSIS — F1020 Alcohol dependence, uncomplicated: Secondary | ICD-10-CM | POA: Diagnosis present

## 2024-05-17 DIAGNOSIS — S6291XA Unspecified fracture of right wrist and hand, initial encounter for closed fracture: Secondary | ICD-10-CM

## 2024-05-17 DIAGNOSIS — R9431 Abnormal electrocardiogram [ECG] [EKG]: Secondary | ICD-10-CM

## 2024-05-17 DIAGNOSIS — F419 Anxiety disorder, unspecified: Secondary | ICD-10-CM | POA: Diagnosis present

## 2024-05-17 DIAGNOSIS — S62334A Displaced fracture of neck of fourth metacarpal bone, right hand, initial encounter for closed fracture: Secondary | ICD-10-CM | POA: Diagnosis present

## 2024-05-17 DIAGNOSIS — F10932 Alcohol use, unspecified with withdrawal with perceptual disturbance: Secondary | ICD-10-CM | POA: Diagnosis not present

## 2024-05-17 DIAGNOSIS — R4182 Altered mental status, unspecified: Secondary | ICD-10-CM | POA: Diagnosis present

## 2024-05-17 DIAGNOSIS — I10 Essential (primary) hypertension: Secondary | ICD-10-CM | POA: Diagnosis present

## 2024-05-17 DIAGNOSIS — K760 Fatty (change of) liver, not elsewhere classified: Secondary | ICD-10-CM | POA: Diagnosis present

## 2024-05-17 DIAGNOSIS — Z841 Family history of disorders of kidney and ureter: Secondary | ICD-10-CM

## 2024-05-17 DIAGNOSIS — R45851 Suicidal ideations: Secondary | ICD-10-CM | POA: Diagnosis present

## 2024-05-17 DIAGNOSIS — Z881 Allergy status to other antibiotic agents status: Secondary | ICD-10-CM | POA: Diagnosis not present

## 2024-05-17 DIAGNOSIS — S62336A Displaced fracture of neck of fifth metacarpal bone, right hand, initial encounter for closed fracture: Secondary | ICD-10-CM | POA: Diagnosis present

## 2024-05-17 DIAGNOSIS — E538 Deficiency of other specified B group vitamins: Secondary | ICD-10-CM | POA: Diagnosis present

## 2024-05-17 DIAGNOSIS — F10939 Alcohol use, unspecified with withdrawal, unspecified: Secondary | ICD-10-CM

## 2024-05-17 DIAGNOSIS — Y906 Blood alcohol level of 120-199 mg/100 ml: Secondary | ICD-10-CM | POA: Diagnosis present

## 2024-05-17 DIAGNOSIS — S62308A Unspecified fracture of other metacarpal bone, initial encounter for closed fracture: Secondary | ICD-10-CM

## 2024-05-17 DIAGNOSIS — Z818 Family history of other mental and behavioral disorders: Secondary | ICD-10-CM | POA: Diagnosis not present

## 2024-05-17 DIAGNOSIS — F10230 Alcohol dependence with withdrawal, uncomplicated: Secondary | ICD-10-CM | POA: Diagnosis present

## 2024-05-17 DIAGNOSIS — F332 Major depressive disorder, recurrent severe without psychotic features: Secondary | ICD-10-CM | POA: Diagnosis present

## 2024-05-17 DIAGNOSIS — F322 Major depressive disorder, single episode, severe without psychotic features: Secondary | ICD-10-CM | POA: Diagnosis not present

## 2024-05-17 DIAGNOSIS — R45850 Homicidal ideations: Secondary | ICD-10-CM | POA: Diagnosis present

## 2024-05-17 DIAGNOSIS — S62338G Displaced fracture of neck of other metacarpal bone, subsequent encounter for fracture with delayed healing: Secondary | ICD-10-CM | POA: Diagnosis not present

## 2024-05-17 DIAGNOSIS — W2201XA Walked into wall, initial encounter: Secondary | ICD-10-CM | POA: Diagnosis present

## 2024-05-17 DIAGNOSIS — K7010 Alcoholic hepatitis without ascites: Secondary | ICD-10-CM | POA: Diagnosis present

## 2024-05-17 DIAGNOSIS — F10229 Alcohol dependence with intoxication, unspecified: Secondary | ICD-10-CM | POA: Diagnosis present

## 2024-05-17 DIAGNOSIS — Z604 Social exclusion and rejection: Secondary | ICD-10-CM | POA: Diagnosis present

## 2024-05-17 DIAGNOSIS — S62338A Displaced fracture of neck of other metacarpal bone, initial encounter for closed fracture: Secondary | ICD-10-CM | POA: Diagnosis not present

## 2024-05-17 DIAGNOSIS — Z789 Other specified health status: Secondary | ICD-10-CM

## 2024-05-17 DIAGNOSIS — Z046 Encounter for general psychiatric examination, requested by authority: Secondary | ICD-10-CM | POA: Diagnosis not present

## 2024-05-17 LAB — CBC WITH DIFFERENTIAL/PLATELET
Abs Immature Granulocytes: 0.02 K/uL (ref 0.00–0.07)
Basophils Absolute: 0.1 K/uL (ref 0.0–0.1)
Basophils Relative: 1 %
Eosinophils Absolute: 0.1 K/uL (ref 0.0–0.5)
Eosinophils Relative: 2 %
HCT: 42.5 % (ref 39.0–52.0)
Hemoglobin: 15 g/dL (ref 13.0–17.0)
Immature Granulocytes: 0 %
Lymphocytes Relative: 30 %
Lymphs Abs: 2.1 K/uL (ref 0.7–4.0)
MCH: 33.7 pg (ref 26.0–34.0)
MCHC: 35.3 g/dL (ref 30.0–36.0)
MCV: 95.5 fL (ref 80.0–100.0)
Monocytes Absolute: 0.7 K/uL (ref 0.1–1.0)
Monocytes Relative: 10 %
Neutro Abs: 4 K/uL (ref 1.7–7.7)
Neutrophils Relative %: 57 %
Platelets: 240 K/uL (ref 150–400)
RBC: 4.45 MIL/uL (ref 4.22–5.81)
RDW: 15.1 % (ref 11.5–15.5)
WBC: 7 K/uL (ref 4.0–10.5)
nRBC: 0 % (ref 0.0–0.2)

## 2024-05-17 LAB — POCT URINE DRUG SCREEN - MANUAL ENTRY (I-SCREEN)
POC Amphetamine UR: NOT DETECTED
POC Buprenorphine (BUP): POSITIVE — AB
POC Cocaine UR: NOT DETECTED
POC Marijuana UR: POSITIVE — AB
POC Methadone UR: NOT DETECTED
POC Methamphetamine UR: NOT DETECTED
POC Morphine: NOT DETECTED
POC Oxazepam (BZO): NOT DETECTED
POC Oxycodone UR: NOT DETECTED
POC Secobarbital (BAR): NOT DETECTED

## 2024-05-17 LAB — COMPREHENSIVE METABOLIC PANEL WITH GFR
ALT: 83 U/L — ABNORMAL HIGH (ref 0–44)
AST: 216 U/L — ABNORMAL HIGH (ref 15–41)
Albumin: 4.7 g/dL (ref 3.5–5.0)
Alkaline Phosphatase: 88 U/L (ref 38–126)
Anion gap: 18 — ABNORMAL HIGH (ref 5–15)
BUN: 6 mg/dL (ref 6–20)
CO2: 22 mmol/L (ref 22–32)
Calcium: 10.5 mg/dL — ABNORMAL HIGH (ref 8.9–10.3)
Chloride: 98 mmol/L (ref 98–111)
Creatinine, Ser: 0.89 mg/dL (ref 0.61–1.24)
GFR, Estimated: >60 mL/min (ref >60)
Glucose, Bld: 89 mg/dL (ref 70–99)
Potassium: 4 mmol/L (ref 3.5–5.1)
Sodium: 138 mmol/L (ref 135–145)
Total Bilirubin: 1.3 mg/dL — ABNORMAL HIGH (ref 0.0–1.2)
Total Protein: 7.3 g/dL (ref 6.5–8.1)

## 2024-05-17 LAB — TSH: TSH: 1.458 u[IU]/mL (ref 0.350–4.500)

## 2024-05-17 LAB — TROPONIN I (HIGH SENSITIVITY): Troponin I (High Sensitivity): 6 ng/L

## 2024-05-17 LAB — PROTIME-INR
INR: 1 (ref 0.8–1.2)
Prothrombin Time: 14.2 s (ref 11.4–15.2)

## 2024-05-17 LAB — ETHANOL: Alcohol, Ethyl (B): 127 mg/dL — ABNORMAL HIGH (ref <15)

## 2024-05-17 MED ORDER — LORAZEPAM 1 MG PO TABS: 1.0000 mg | ORAL_TABLET | Freq: Four times a day (QID) | ORAL | Status: DC | PRN

## 2024-05-17 MED ORDER — TRAMADOL HCL 50 MG PO TABS: 50.0000 mg | ORAL_TABLET | Freq: Once | ORAL | Status: DC

## 2024-05-17 MED ORDER — THIAMINE MONONITRATE 100 MG PO TABS
100.0000 mg | ORAL_TABLET | Freq: Every day | ORAL | Status: DC
  Administered 2024-05-17 – 2024-05-21 (×5): 100 mg via ORAL
  Filled 2024-05-17 (×5): qty 1

## 2024-05-17 MED ORDER — LORAZEPAM 2 MG/ML IJ SOLN
1.0000 mg | Freq: Once | INTRAMUSCULAR | Status: AC
  Administered 2024-05-17: 1 mg via INTRAMUSCULAR
  Filled 2024-05-17: qty 1

## 2024-05-17 MED ORDER — THIAMINE MONONITRATE 100 MG PO TABS: 100.0000 mg | ORAL_TABLET | Freq: Every day | ORAL | Status: DC

## 2024-05-17 MED ORDER — CHLORDIAZEPOXIDE HCL 25 MG PO CAPS
25.0000 mg | ORAL_CAPSULE | Freq: Once | ORAL | Status: AC
  Administered 2024-05-17: 25 mg via ORAL
  Filled 2024-05-17: qty 1

## 2024-05-17 MED ORDER — GABAPENTIN 300 MG PO CAPS: 300.0000 mg | ORAL_CAPSULE | Freq: Every day | ORAL | Status: DC

## 2024-05-17 MED ORDER — THIAMINE HCL 100 MG/ML IJ SOLN: 100.0000 mg | Freq: Once | INTRAMUSCULAR | Status: DC

## 2024-05-17 MED ORDER — GABAPENTIN 100 MG PO CAPS: 100.0000 mg | ORAL_CAPSULE | Freq: Every day | ORAL | Status: DC

## 2024-05-17 MED ORDER — LORAZEPAM 1 MG PO TABS: 1.0000 mg | ORAL_TABLET | Freq: Two times a day (BID) | ORAL | Status: DC

## 2024-05-17 MED ORDER — ONDANSETRON 4 MG PO TBDP
4.0000 mg | ORAL_TABLET | Freq: Four times a day (QID) | ORAL | Status: DC | PRN
  Administered 2024-05-17: 4 mg via ORAL
  Filled 2024-05-17: qty 1

## 2024-05-17 MED ORDER — THIAMINE HCL 100 MG/ML IJ SOLN: 100.0000 mg | Freq: Every day | INTRAMUSCULAR | Status: DC

## 2024-05-17 MED ORDER — LORAZEPAM 2 MG/ML IJ SOLN: 1.0000 mg | INTRAMUSCULAR | Status: DC | PRN

## 2024-05-17 MED ORDER — THIAMINE HCL 100 MG/ML IJ SOLN
100.0000 mg | Freq: Every day | INTRAMUSCULAR | Status: DC
  Filled 2024-05-17 (×2): qty 2

## 2024-05-17 MED ORDER — HYDROXYZINE HCL 25 MG PO TABS
25.0000 mg | ORAL_TABLET | Freq: Four times a day (QID) | ORAL | Status: AC | PRN
  Administered 2024-05-18 – 2024-05-20 (×3): 25 mg via ORAL
  Filled 2024-05-17 (×3): qty 1

## 2024-05-17 MED ORDER — FOLIC ACID 1 MG PO TABS
1.0000 mg | ORAL_TABLET | Freq: Every day | ORAL | Status: DC
  Administered 2024-05-17: 1 mg via ORAL
  Filled 2024-05-17: qty 1

## 2024-05-17 MED ORDER — CHLORDIAZEPOXIDE HCL 25 MG PO CAPS
25.0000 mg | ORAL_CAPSULE | Freq: Every day | ORAL | Status: AC
  Administered 2024-05-20: 25 mg via ORAL
  Filled 2024-05-17: qty 1

## 2024-05-17 MED ORDER — VITAMIN D 25 MCG (1000 UNIT) PO TABS
1000.0000 [IU] | ORAL_TABLET | Freq: Every day | ORAL | Status: DC
Start: 2024-05-17 — End: 2024-05-17
  Administered 2024-05-17: 1000 [IU] via ORAL
  Filled 2024-05-17: qty 1

## 2024-05-17 MED ORDER — CHLORDIAZEPOXIDE HCL 25 MG PO CAPS
25.0000 mg | ORAL_CAPSULE | Freq: Three times a day (TID) | ORAL | Status: AC
  Administered 2024-05-18 – 2024-05-19 (×3): 25 mg via ORAL
  Filled 2024-05-17 (×3): qty 1

## 2024-05-17 MED ORDER — LOPERAMIDE HCL 2 MG PO CAPS: 2.0000 mg | ORAL_CAPSULE | ORAL | Status: DC | PRN

## 2024-05-17 MED ORDER — AMLODIPINE BESYLATE 5 MG PO TABS
5.0000 mg | ORAL_TABLET | Freq: Every day | ORAL | Status: DC
  Administered 2024-05-17: 5 mg via ORAL
  Filled 2024-05-17: qty 1

## 2024-05-17 MED ORDER — PROPRANOLOL HCL 20 MG PO TABS: 10.0000 mg | ORAL_TABLET | Freq: Four times a day (QID) | ORAL | Status: DC | PRN

## 2024-05-17 MED ORDER — ONDANSETRON 4 MG PO TBDP: 4.0000 mg | ORAL_TABLET | Freq: Four times a day (QID) | ORAL | Status: AC | PRN

## 2024-05-17 MED ORDER — LORAZEPAM 1 MG PO TABS: 1.0000 mg | ORAL_TABLET | Freq: Three times a day (TID) | ORAL | Status: DC

## 2024-05-17 MED ORDER — ADULT MULTIVITAMIN W/MINERALS CH
1.0000 | ORAL_TABLET | Freq: Every day | ORAL | Status: DC
  Administered 2024-05-17 – 2024-05-21 (×5): 1 via ORAL
  Filled 2024-05-17 (×5): qty 1

## 2024-05-17 MED ORDER — PANTOPRAZOLE SODIUM 20 MG PO TBEC
20.0000 mg | DELAYED_RELEASE_TABLET | Freq: Every day | ORAL | Status: DC
  Administered 2024-05-17: 20 mg via ORAL
  Filled 2024-05-17: qty 1

## 2024-05-17 MED ORDER — GABAPENTIN 100 MG PO CAPS
100.0000 mg | ORAL_CAPSULE | Freq: Three times a day (TID) | ORAL | Status: DC
Start: 2024-05-17 — End: 2024-05-17
  Administered 2024-05-17 (×2): 100 mg via ORAL
  Filled 2024-05-17 (×2): qty 1

## 2024-05-17 MED ORDER — ESCITALOPRAM OXALATE 10 MG PO TABS
5.0000 mg | ORAL_TABLET | Freq: Every day | ORAL | Status: DC
  Administered 2024-05-18 – 2024-05-21 (×4): 5 mg via ORAL
  Filled 2024-05-17 (×4): qty 1

## 2024-05-17 MED ORDER — DONEPEZIL HCL 5 MG PO TABS
5.0000 mg | ORAL_TABLET | Freq: Every day | ORAL | Status: DC
Start: 2024-05-17 — End: 2024-05-17

## 2024-05-17 MED ORDER — AMLODIPINE BESYLATE 5 MG PO TABS
5.0000 mg | ORAL_TABLET | Freq: Every day | ORAL | Status: DC
Start: 2024-05-18 — End: 2024-05-21
  Administered 2024-05-18 – 2024-05-21 (×4): 5 mg via ORAL
  Filled 2024-05-17 (×4): qty 1

## 2024-05-17 MED ORDER — LACTATED RINGERS IV BOLUS
1000.0000 mL | Freq: Once | INTRAVENOUS | Status: AC
  Administered 2024-05-17: 1000 mL via INTRAVENOUS

## 2024-05-17 MED ORDER — POTASSIUM CHLORIDE CRYS ER 20 MEQ PO TBCR
20.0000 meq | EXTENDED_RELEASE_TABLET | Freq: Every day | ORAL | Status: DC
  Administered 2024-05-17: 20 meq via ORAL
  Filled 2024-05-17: qty 1

## 2024-05-17 MED ORDER — HYDROXYZINE HCL 25 MG PO TABS: 25.0000 mg | ORAL_TABLET | Freq: Four times a day (QID) | ORAL | Status: DC | PRN

## 2024-05-17 MED ORDER — LORAZEPAM 1 MG PO TABS
1.0000 mg | ORAL_TABLET | Freq: Four times a day (QID) | ORAL | Status: DC
  Administered 2024-05-17 (×2): 1 mg via ORAL
  Filled 2024-05-17 (×2): qty 1

## 2024-05-17 MED ORDER — PANTOPRAZOLE SODIUM 20 MG PO TBEC
20.0000 mg | DELAYED_RELEASE_TABLET | Freq: Every day | ORAL | Status: DC
  Administered 2024-05-18 – 2024-05-21 (×4): 20 mg via ORAL
  Filled 2024-05-17 (×4): qty 1

## 2024-05-17 MED ORDER — LORAZEPAM 1 MG PO TABS
1.0000 mg | ORAL_TABLET | ORAL | Status: DC | PRN
  Administered 2024-05-17: 2 mg via ORAL
  Filled 2024-05-17: qty 2

## 2024-05-17 MED ORDER — LORAZEPAM 1 MG PO TABS: 1.0000 mg | ORAL_TABLET | ORAL | Status: DC | PRN

## 2024-05-17 MED ORDER — LORAZEPAM 1 MG PO TABS: 1.0000 mg | ORAL_TABLET | Freq: Every day | ORAL | Status: DC

## 2024-05-17 MED ORDER — ADULT MULTIVITAMIN W/MINERALS CH
1.0000 | ORAL_TABLET | Freq: Every day | ORAL | Status: DC
Start: 2024-05-17 — End: 2024-05-17

## 2024-05-17 MED ORDER — KETOROLAC TROMETHAMINE 15 MG/ML IJ SOLN
15.0000 mg | Freq: Four times a day (QID) | INTRAMUSCULAR | Status: AC | PRN
  Administered 2024-05-19 – 2024-05-20 (×3): 15 mg via INTRAVENOUS
  Filled 2024-05-17 (×3): qty 1

## 2024-05-17 MED ORDER — CHLORDIAZEPOXIDE HCL 25 MG PO CAPS
25.0000 mg | ORAL_CAPSULE | ORAL | Status: AC
  Administered 2024-05-19 – 2024-05-20 (×2): 25 mg via ORAL
  Filled 2024-05-17 (×2): qty 1

## 2024-05-17 MED ORDER — VITAMIN B-12 1000 MCG PO TABS
1000.0000 ug | ORAL_TABLET | Freq: Every day | ORAL | Status: DC
  Administered 2024-05-17: 1000 ug via ORAL
  Filled 2024-05-17: qty 1

## 2024-05-17 MED ORDER — ENOXAPARIN SODIUM 40 MG/0.4ML IJ SOSY
40.0000 mg | PREFILLED_SYRINGE | INTRAMUSCULAR | Status: DC
  Administered 2024-05-18 – 2024-05-21 (×4): 40 mg via SUBCUTANEOUS
  Filled 2024-05-17 (×4): qty 0.4

## 2024-05-17 MED ORDER — ESCITALOPRAM OXALATE 5 MG PO TABS
5.0000 mg | ORAL_TABLET | Freq: Every day | ORAL | Status: DC
  Administered 2024-05-17: 5 mg via ORAL
  Filled 2024-05-17: qty 1

## 2024-05-17 MED ORDER — GABAPENTIN 300 MG PO CAPS
300.0000 mg | ORAL_CAPSULE | Freq: Three times a day (TID) | ORAL | Status: DC
  Administered 2024-05-17 – 2024-05-21 (×13): 300 mg via ORAL
  Filled 2024-05-17 (×13): qty 1

## 2024-05-17 MED ORDER — IBUPROFEN 400 MG PO TABS: 800.0000 mg | ORAL_TABLET | Freq: Three times a day (TID) | ORAL | Status: DC | PRN

## 2024-05-17 MED ORDER — ADULT MULTIVITAMIN W/MINERALS CH
1.0000 | ORAL_TABLET | Freq: Every day | ORAL | Status: DC
  Administered 2024-05-17: 1 via ORAL
  Filled 2024-05-17: qty 1

## 2024-05-17 MED ORDER — CHLORDIAZEPOXIDE HCL 25 MG PO CAPS
25.0000 mg | ORAL_CAPSULE | Freq: Four times a day (QID) | ORAL | Status: AC
  Administered 2024-05-18 (×3): 25 mg via ORAL
  Filled 2024-05-17 (×4): qty 1

## 2024-05-17 MED ORDER — NICOTINE 14 MG/24HR TD PT24
14.0000 mg | MEDICATED_PATCH | Freq: Every day | TRANSDERMAL | Status: DC
  Administered 2024-05-17: 14 mg via TRANSDERMAL
  Filled 2024-05-17 (×5): qty 1

## 2024-05-17 MED ORDER — IBUPROFEN 200 MG PO TABS: 200.0000 mg | ORAL_TABLET | Freq: Four times a day (QID) | ORAL | Status: DC | PRN

## 2024-05-17 MED ORDER — FOLIC ACID 1 MG PO TABS
1.0000 mg | ORAL_TABLET | Freq: Every day | ORAL | Status: DC
  Administered 2024-05-17 – 2024-05-21 (×5): 1 mg via ORAL
  Filled 2024-05-17 (×5): qty 1

## 2024-05-17 NOTE — ED Notes (Signed)
 IVC paperwork complete, awaiting case number, envelope # [id number]. Original copy in red folder, one copy with stickers in medical records and 3 copies attached to clip board in blue nursing station. IVC' D 05/17/2024 EXP:05/24/2024

## 2024-05-17 NOTE — Assessment & Plan Note (Signed)
 Mild LFT pattern consistent with alcoholic hepatitis.  Fatty liver seen on ultrasound from 07/17.  Bilirubin mildly elevated.  No known varices - Likely chronic - Trend CMP - INR pending

## 2024-05-17 NOTE — ED Notes (Signed)
 Pt presented sitting by phone,  calm upon approach.  Pt is now agreeable to having labs drawn and providing urine sample.   Denied current SI plan and intent.

## 2024-05-17 NOTE — ED Notes (Signed)
 IVC paperwork copies placed in envelope with stickers and given to charge Winslow.

## 2024-05-17 NOTE — ED Notes (Addendum)
 Pt admitted after IVC'd by wife d/t concerns of killing himself w/firearm at home. Pt states I have no plans to harm myself or my wife, that's why I hit the bed and now my hand is broken. We've been having some problems and I just needed some space, so I went into the room alone, and I understand her concern since I do have a licensed firearm; but I ain't killing myself or no one else.  Patient alert and oriented. Affect/mood. Denies SI, HI, AVH, and pain. I just want to go home and go to my appt in the morning to see about this hand. Pt c/o pain, but declines medication d/t The Tramadol  makes me itch. Support and encouragement provided.  Routine safety checks conducted. Patient informed to notify staff with problems or concerns.  Patient contracts for safety at this time. Patient not compliant with treatment plan. Refusing labs, EKG, and urine specimen; I don't need to do none of that they just stuck me and I drank yes, and a little blunt the other day w/my cousin. I don't need to be here and I too scared for them to stick me again.  Offered ice for right hand, but pt declined. Patient remains safe at this time.

## 2024-05-17 NOTE — ED Notes (Signed)
 Pt requesting gabapentin  to help with pain in foot. Ice was offered for right hand again, but pt declined. Pt was offered support and encouragement. I just want to go home where I have my meds. Safety maintained on unit. Routine safety checks maintained/ongoing.

## 2024-05-17 NOTE — BH Assessment (Signed)
 Comprehensive Clinical Assessment (CCA) Note  05/17/2024 Russell Hansen [id number]  Chief Complaint:  Chief Complaint  Patient presents with   IVC    Suicidal  Disposition: Per [name] patient is recommended for inpatient admission. Disposition SW to pursue appropriate inpatient options.  The patient demonstrates the following risk factors for suicide: Chronic risk factors for suicide include: N/A. Acute risk factors for suicide include: family or marital conflict. Protective factors for this patient include: hope for the future. Considering these factors, the overall suicide risk at this point appears to be high. Patient is not appropriate for outpatient follow up.  Russell Hansen is a 43 year old male who presents to [facility] escorted by [employer] under IVC due to SI with a plan to shoot himself. Per [employer] the patients family called reporting that he had a gun and threatened SI. The family reported that they attempted to take the gun away but the patient was able to obtain it again and went into his room. Police reported they arrived and were able to retrieve the gun from the patient. Patient states he was feeling suicidal and did endorse having a gun. He states I told my wife to take the kids and leave. He states he went into his room with the gun with intentions to harm himself. He states I didn't want to hurt anybody else just myself. He reports stress due to ongoing marital issues with his wife. He reports 2 days ago he became upset and punched a headboard and broke his hand. He is observed with a swollen and bruised right hand. He states he is expected to get surgery on his hand on Monday. He reports hx of alcoholism, last drink was today 1 shot around 10am. Patient does have an odor of alcohol,glossy eyes, and salivating mouth during the assessment, which suggest more recent alcohol consumption although he denies.He reports hx of DT's and seizures when withdrawing from alcohol. He  reports going to rehab for 21 days from March to April and reports he relapsed in May. He reports typically drinking 6-8 shots of liquor daily. He denies any other substance abuse. Pt denies HI and AVH   Patient reports isolation, crying spells, irritability, hopelessness, loss of interest to do things they enjoy, fatigue, lack of concentration, worthlessness, unstable sleeping patterns, and decreased appetite. Patient denies history of past suicide attempts.Patient denies history of abuse or trauma. Patient denies current legal problems. Patient is not receiving outpatient therapy or  psychiatry services.  Patient denies access to weapons, he reports the police confiscated his weapon upon their arrival.Patient is unable to to contract for safety outside of the hospital. Patient gives permission to speak with his brother Russell Hansen.     Visit Diagnosis:  Suicidal Ideation Involuntary Commitment Alcohol Abuse with intoxication     CCA Screening, Triage and Referral (STR)  Patient Reported Information How did you hear about us ? Legal System  What Is the Reason for Your Visit/Call Today? Russell Hansen is a 43 year old male who presents to [facility] escorted by [employer] under IVC due to SI with a plan to shoot himself. Per [employer] the patients family called  reporting that he had a gun and threatened SI. The family reported that they attempted to take the gun away but the patient was able to obtain it again and went into his room. Police reported they arrived and were able to retrieve the gun from the patient. Patient states he was feeling suicidal and did endorse  having a gun. He states I told my wife to take the kids and leave. He states he went into his room with the gun with intentions to harm himself. He states I didn't want to hurt anybody else just myself. He reports stress due to ongoing marital issues with his wife. He reports 2 days ago he became upset and punched a headboard and broke his  hand. He is observed with a swollen and bruised right hand. He states he is expected to get surgery on his hand on Monday. He reports hx of alcoholism, last drink was today 1 shot around 10am. Patient does have an odor of alcohol,glossy eyes, and salivating mouth during the assessment, which suggest more recent alcohol consumption although he denies.He reports hx of DT's and seizures when withdrawing from alcohol. He reports going to rehab for 21 days from March to April and reports he relapsed in May. He reports typically drinking 6-8 shots of liquor daily. He denies any other substance abuse. Pt denies HI and AVH  How Long Has This Been Causing You Problems? > than 6 months  What Do You Feel Would Help You the Most Today? Alcohol or Drug Use Treatment; Treatment for Depression or other mood problem   Have You Recently Had Any Thoughts About Hurting Yourself? Yes  Are You Planning to Commit Suicide/Harm Yourself At This time? Yes   Flowsheet Row ED from 05/16/2024 in [facility] ED from 05/15/2024 in [facility] Emergency Department at [facility] ED from 04/27/2024 in [facility] Department at [facility]  C-SSRS RISK CATEGORY High Risk No Risk No Risk    Have you Recently Had Thoughts About Hurting Someone Sher[place]? No  Are You Planning to Harm Someone at This Time? No  Explanation: denies HI   Have You Used Any Alcohol or Drugs in the Past 24 Hours? Yes  How Long Ago Did You Use Drugs or Alcohol? today What Did You Use and How Much? 1 shot of liquor today   Do You Currently Have a Therapist/Psychiatrist? No  Name of Therapist/Psychiatrist:    Have You Been Recently Discharged From Any Office Practice or Programs? Yes  Explanation of Discharge From Practice/Program: D/C from rehab facility in [facility] in April 2025 after 21 days     CCA Screening Triage Referral Assessment Type of Contact: Face-to-Face  Telemedicine  Service Delivery:   Is this Initial or Reassessment?   Date Telepsych consult ordered in [facility]:    Time Telepsych consult ordered in [facility]:    Location of Assessment: [facility] Assessment Services  Provider Location: [facility] Assessment Services   Collateral Involvement: [employer]   Does Patient Have a Automotive engineer Guardian? No  Legal Guardian Contact Information: n/a  Copy of Legal Guardianship Form: -- (n/a)  Legal Guardian Notified of Arrival: -- (n/a)  Legal Guardian Notified of Pending Discharge: -- (n/a)  If Minor and Not Living with Parent(s), Who has Custody? n/a  Is CPS involved or ever been involved? Never  Is APS involved or ever been involved? Never   Patient Determined To Be At Risk for Harm To Self or Others Based on Review of Patient Reported Information or Presenting Complaint? Yes, for Self-Harm  Method: Plan with intent and identified person  Availability of Means: No access or NA  Intent: Clearly intends on inflicting harm that could cause death  Notification Required: No need or identified person  Additional Information for Danger to Others Potential: -- (  n/a)  Additional Comments for Danger to Others Potential: Had a weapon (police took it away) threatened SI prior to arrival  Are There Guns or Other Weapons in Your Home? No  Types of Guns/Weapons: Patient had a gun but police took it away when they arrived today  Are These Weapons Safely Secured?                            Yes  Who Could Verify You Are Able To Have These Secured: [employer]-took weapon  Do You Have any Outstanding Charges, Pending Court Dates, Parole/Probation? denies legal concerns  Contacted To Inform of Risk of Harm To Self or Others: Patent examiner; Family/Significant Other:    Does Patient Present under Involuntary Commitment? Yes    [place] of Residence: [place]   Patient Currently Receiving the Following Services: Not Receiving Services   Determination of Need:  Emergent (2 hours)   Options For Referral: Inpatient Hospitalization     CCA Biopsychosocial Patient Reported Schizophrenia/Schizoaffective Diagnosis in Past: No   Strengths: Family Support   Mental Health Symptoms Depression:  Change in energy/activity; Difficulty Concentrating; Fatigue; Hopelessness; Increase/decrease in appetite; Irritability; Sleep (too much or little); Tearfulness; Worthlessness   Duration of Depressive symptoms: Duration of Depressive Symptoms: Greater than two weeks   Mania:  Recklessness   Anxiety:   Worrying; Tension; Sleep   Psychosis:  None   Duration of Psychotic symptoms:    Trauma:  N/A   Obsessions:  N/A   Compulsions:  N/A   Inattention:  N/A   Hyperactivity/Impulsivity:  N/A   Oppositional/Defiant Behaviors:  N/A   Emotional Irregularity:  Potentially harmful impulsivity; Recurrent suicidal behaviors/gestures/threats   Other Mood/Personality Symptoms:  n/a    Mental Status Exam Appearance and self-care  Stature:  Average   Weight:  Average weight   Clothing:  Casual   Grooming:  Normal   Cosmetic use:  None   Posture/gait:  Tense; Stooped   Motor activity:  Restless   Sensorium  Attention:  Normal   Concentration:  Normal   Orientation:  X5   Recall/memory:  Normal   Affect and Mood  Affect:  Anxious; Depressed   Mood:  Depressed; Anxious; Irritable   Relating  Eye contact:  Normal   Facial expression:  Depressed; Tense   Attitude toward examiner:  Cooperative; Defensive   Thought and Language  Speech flow: Clear and Coherent   Thought content:  Appropriate to Mood and Circumstances   Preoccupation:  Suicide   Hallucinations:  None   Organization:  Goal-directed   [employer] of Knowledge:  Average   Intelligence:  Average   Abstraction:  Functional   Judgement:  Dangerous; Impaired   Reality Testing:  Realistic   Insight:  Fair   Decision Making:  Impulsive    Social Functioning  Social Maturity:  Impulsive   Social Judgement:  Impropriety   Stress  Stressors:  Family conflict; Other (Comment) (alcohol)   Coping Ability:  Overwhelmed; Exhausted   Skill Deficits:  Self-care; Self-control; Interpersonal; Decision making   Supports:  Support needed; Family     Religion: Religion/Spirituality Are You A Religious Person?: Yes What is Your Religious Affiliation?: Christian How Might This Affect Treatment?: n/a  Leisure/Recreation: Leisure / Recreation Do You Have Hobbies?: No  Exercise/Diet: Exercise/Diet Do You Exercise?: No Have You Gained or Lost A Significant Amount of Weight in the Past Six Months?: No Do You Follow  a Special Diet?: No Do You Have Any Trouble Sleeping?: Yes Explanation of Sleeping Difficulties: reports unstable sleeping patterns, issues staying asleep   CCA Employment/Education Employment/Work Situation: Employment / Work Situation Employment Situation: Employed 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?: No  Education: Education Is Patient Currently Attending School?: No Last Grade Completed: 12 Did You Product manager?: Yes What Type of College Degree Do you Have?: Masters degree Did You Have An Individualized Education Program (IIEP): No Did You Have Any Difficulty At [employer]?: No Patient's Education Has Been Impacted by Current Illness: No   CCA Family/Childhood History Family and Relationship History: Family history Marital status: Married Number of Years Married:  (unknown) What types of issues is patient dealing with in the relationship?: constant disagreements Additional relationship information: patient is an alcoholic. Does patient have children?: Yes How many children?: 2 How is patient's relationship with their children?: good  Childhood  History:  Childhood History By whom was/is the patient raised?: Other (Comment) (n/a) Did patient suffer any verbal/emotional/physical/sexual abuse as a child?: No Did patient suffer from severe childhood neglect?: No Has patient ever been sexually abused/assaulted/raped as an adolescent or adult?: No Was the patient ever a victim of a crime or a disaster?: No Witnessed domestic violence?: No Has patient been affected by domestic violence as an adult?: No       CCA Substance Use Alcohol/Drug Use: Alcohol / Drug Use Pain Medications: See MAR Prescriptions: See MAR Over the Counter: See MAR History of alcohol / drug use?: Yes Longest period of sobriety (when/how long): unknown Negative Consequences of Use: Personal relationships Withdrawal Symptoms: Agitation, Delirium, DTs, Seizures, Irritability, Nausea / Vomiting, Tremors Onset of Seizures: n/a Date of most recent seizure: Reports seizure in March 2025 Substance #1 Name of Substance 1: Alcohol (ETOH) 1 - Age of First Use: Unknown- reports he started drinking daily in 2017 when he father became sick 1 - Amount (size/oz): 6-8 shots of liquor 1 - Frequency: daily 1 - Duration: ongoing 1 - Last Use / Amount: today 1 shot of liquor 1 - Method of Aquiring: store 1- Route of Use: oral consumption                       ASAM's:  Six Dimensions of Multidimensional Assessment  Dimension 1:  Acute Intoxication and/or Withdrawal Potential:   Dimension 1:  Description of individual's past and current experiences of substance use and withdrawal: Ongoing use of alcohol, hx of withdrawal symptoms  Dimension 2:  Biomedical Conditions and Complications:   Dimension 2:  Description of patient's biomedical conditions and  complications: hx of seizures and DT's  Dimension 3:  Emotional, Behavioral, or Cognitive Conditions and Complications:  Dimension 3:  Description of emotional, behavioral, or cognitive conditions and  complications: reports increased anxiety, SI with a plan, hopelessness, worthlessness  Dimension 4:  Readiness to Change:  Dimension 4:  Description of Readiness to Change criteria: Reports he wants to stop  Dimension 5:  Relapse, Continued use, or Continued Problem Potential:  Dimension 5:  Relapse, continued use, or continued problem potential critiera description: Continued use despite medical issues/mental health concerns  Dimension 6:  Recovery/Living Environment:  Dimension 6:  Recovery/Iiving environment criteria description: Lives with wife and reports marital issues  ASAM Severity Score: ASAM's Severity Rating Score: 10  ASAM Recommended Level of Treatment:  ASAM Recommended Level of Treatment: Level III Residential Treatment   Substance use Disorder (SUD) Substance Use Disorder (SUD)  Checklist Symptoms of Substance Use: Persistent desire or unsuccessful efforts to cut down or control use, Continued use despite persistent or recurrent social, interpersonal problems, caused or exacerbated by use, Evidence of withdrawal (Comment), Evidence of tolerance, Substance(s) often taken in larger amounts or over longer times than was intended, Continued use despite having a persistent/recurrent physical/psychological problem caused/exacerbated by use  Recommendations for Services/Supports/Treatments: Recommendations for Services/Supports/Treatments Recommendations For Services/Supports/Treatments: Inpatient Hospitalization  Disposition Recommendation per psychiatric provider: We recommend inpatient psychiatric hospitalization after medical hospitalization. Patient has been involuntarily committed on 05/16/24.    DSM5 Diagnoses: Patient Active Problem List   Diagnosis Date Noted   Kidney lesion 08/23/2023   Thrombocytopenia (HCC) 07/26/2023   Alcohol use 07/26/2023   Elevated LFTs 07/26/2023     Referrals to Alternative Service(s): Referred to Alternative Service(s):   Place:   Date:    Time:    Referred to Alternative Service(s):   Place:   Date:   Time:    Referred to Alternative Service(s):   Place:   Date:   Time:    Referred to Alternative Service(s):   Place:   Date:   Time:     Mehgan Santmyer C Dora Simeone, LCMHCA

## 2024-05-17 NOTE — ED Provider Notes (Signed)
 FBC/OBS ASAP Discharge Summary  Date and Time: 05/17/2024 3:30 PM  Name: Russell Hansen  MRN:  [id number]   Discharge Diagnoses:  Final diagnoses:  Involuntary commitment  Suicidal ideation  Alcohol abuse with intoxication (HCC)    Subjective: On initial interview, patient denies suicidal ideation and minimizes events of last night.  He appears anxious and slightly shaky (CIWA: 3) when discussing what happened, patient disputes the idea that police took the gun from me.  Patient instead says that it was on the floor when he took it.  Repeatedly says that he is fine and he can go home.  Gave verbal permission to speak with his wife, Tia.  Says that he can remember most of what happened last night.  Minimizes the amount of drinking that he undertook, said he had a couple drinks and started feeling sad.  Reports that he has multiple doctors visits next week when he must attend.  Notes that next Monday he must drive his child to a summer camp that has already been paid for.  Informed patient that the first exam/affidavit paperwork was already filed and that, given his presentation, he would need a psychiatric evaluation in an inpatient hospital setting. He appeared displeased by this.  He exhibits extraordinarily poor insight and judgment.  Additionally, patient would require observation for alcohol withdrawal, IVC'd or not, (last drink 7/24) given his previous history and tremulousness on exam.  Required ED presentation earlier this year after relapsing on alcohol and took a Librium  taper home.  Reported that his hand (which he recently broke after punching his bed) with sore and asked for pain medication.  On evaluation, patient is hand appeared swollen, but was not warm to touch, tender out of proportion or inflamed.  Patient denied feeling feverish.  Endorses a previous history of alcohol withdrawal including the following symptoms: Tremulousness, sweating, and alcoholic hallucinosis.   Patient was started on CIWA and scheduled Ativan .  Was started back on his home medication regimen.  Later in afternoon, patient became more tremulous and restarted reporting visual hallucinations: Stealing shapes and black lines.  On reinterview, patient was tremulous and understood that he was undergoing alcohol withdrawal.  Was amenable to transfer to the emergency department.  EMTALA completed at that time.  Was given PO Ativan  1 mg x2 over the course the morning, followed by IM Ativan  1 mg x1.  10 minutes after administration, patient's blood pressure: 131/100 and HR 115. [facility] provider Edsel Dade was informed, and accepted patient.  Stay Summary: Patient presented intoxicated via [employer] under IVC late in the p.m. 7/24.  He was IVC by Gaither Pouch at that time for reported threats of suicide and and possession of a firearm.  Per IVC, patient told his wife and children to leave the house and took a gun into his room.  Gun had to be removed by [employer].  Patient was started on the CIWA protocol after endorsing history of alcohol withdrawal.  Patient received Ativan  as above, and was transferred without incident to the [facility] Emergency Department later that day when he developed alcoholic hallucinosis.  Total Time spent with patient: 45 minutes  Past Psychiatric History: Major depressive disorder, alcohol use disorder, severe, with withdrawal symptoms.  Patient does not see a psychiatrist outpatient.  Patient does not see a therapist outpatient.  Receives Lexapro 5 mg daily from his neurologist for anxiety.  Does not appear to have a previous history of psychiatric hospitalization. Past Medical History: Patient has severe  GERD likely secondary to alcohol use, with an appointment coming up 7/31 for scope.  Was recently seen by neurology for an episode of shaking, during which patient was conscious.  Has vitamin D and vitamin B12 deficiencies.  Broke the right 4th and 5th metacarpals of his right upper  extremity after punching a headboard 7/23.  Also has a proximal fifth toe nondisplaced fracture.  Patient has chronic elevated LFTs AST >ALT, likely secondary to alcohol use disorder. Family Psychiatric History: Unknown Social History: Lives with wife.  Marital troubles.  Patient mentioned planned to return to his brother's home after this visit.  Current Medications:  No current facility-administered medications for this encounter.   Current Outpatient Medications  Medication Sig Dispense Refill   amLODipine (NORVASC) 5 MG tablet Take 5 mg by mouth daily.     cholecalciferol (VITAMIN D3) 25 MCG (1000 UNIT) tablet Take 1,000 Units by mouth daily.     cyanocobalamin  (VITAMIN B12) 1000 MCG tablet Take 1,000 mcg by mouth daily.     escitalopram (LEXAPRO) 5 MG tablet Take 5 mg by mouth daily.     gabapentin  (NEURONTIN ) 300 MG capsule Take 300 mg by mouth 3 (three) times daily.     magnesium  oxide (MAG-OX) 400 (240 Mg) MG tablet Take 400 mg by mouth 2 (two) times daily.     propranolol (INDERAL) 10 MG tablet Take 10 mg by mouth 4 (four) times daily as needed (anxiety or fast heart rate).     sildenafil (REVATIO) 20 MG tablet Take 20-60 mg by mouth daily as needed.     traMADol  (ULTRAM ) 50 MG tablet Take 50 mg by mouth every 8 (eight) hours as needed for moderate pain (pain score 4-6).     ibuprofen  (ADVIL ) 800 MG tablet Take 1 tablet (800 mg total) by mouth every 8 (eight) hours as needed. 30 tablet 0   ondansetron  (ZOFRAN -ODT) 4 MG disintegrating tablet Take 1 tablet (4 mg total) by mouth every 8 (eight) hours as needed for nausea or vomiting. 20 tablet 0   pantoprazole  (PROTONIX ) 20 MG tablet Take 1 tablet (20 mg total) by mouth daily. 30 tablet 1   potassium chloride  SA (KLOR-CON  M) 20 MEQ tablet Take 1 tablet (20 mEq total) by mouth 2 (two) times daily. (Patient taking differently: Take 20 mEq by mouth daily.) 20 tablet 0    PTA Medications:  Facility Ordered Medications  Medication    [COMPLETED] LORazepam  (ATIVAN ) injection 1 mg   PTA Medications  Medication Sig   traMADol  (ULTRAM ) 50 MG tablet Take 50 mg by mouth every 8 (eight) hours as needed for moderate pain (pain score 4-6).   escitalopram (LEXAPRO) 5 MG tablet Take 5 mg by mouth daily.   propranolol (INDERAL) 10 MG tablet Take 10 mg by mouth 4 (four) times daily as needed (anxiety or fast heart rate).   gabapentin  (NEURONTIN ) 300 MG capsule Take 300 mg by mouth 3 (three) times daily.   amLODipine (NORVASC) 5 MG tablet Take 5 mg by mouth daily.   sildenafil (REVATIO) 20 MG tablet Take 20-60 mg by mouth daily as needed.   cyanocobalamin  (VITAMIN B12) 1000 MCG tablet Take 1,000 mcg by mouth daily.   cholecalciferol (VITAMIN D3) 25 MCG (1000 UNIT) tablet Take 1,000 Units by mouth daily.   magnesium  oxide (MAG-OX) 400 (240 Mg) MG tablet Take 400 mg by mouth 2 (two) times daily.   ondansetron  (ZOFRAN -ODT) 4 MG disintegrating tablet Take 1 tablet (4 mg total) by mouth every  8 (eight) hours as needed for nausea or vomiting.   potassium chloride  SA (KLOR-CON  M) 20 MEQ tablet Take 1 tablet (20 mEq total) by mouth 2 (two) times daily. (Patient taking differently: Take 20 mEq by mouth daily.)   pantoprazole  (PROTONIX ) 20 MG tablet Take 1 tablet (20 mg total) by mouth daily.   ibuprofen  (ADVIL ) 800 MG tablet Take 1 tablet (800 mg total) by mouth every 8 (eight) hours as needed.       07/26/2023   11:32 AM  Depression screen PHQ 2/9  Decreased Interest 0  Down, Depressed, Hopeless 0  PHQ - 2 Score 0    Flowsheet Row ED from 05/16/2024 in [facility] ED from 05/15/2024 in [facility] Emergency Department at [facility] ED from 04/27/2024 in [facility] Emergency Department at [facility]  C-SSRS RISK CATEGORY Low Risk No Risk No Risk    Musculoskeletal  Strength & Muscle Tone: decreased Gait & Station: normal Patient leans: N/A  Psychiatric Specialty Exam  Presentation  General  Appearance:  Disheveled  Eye Contact: Fleeting  Speech: Clear and Coherent  Speech Volume: Increased  Handedness: Right   Mood and Affect  Mood: Anxious  Affect: Blunt   Thought Process  Thought Processes: Coherent  Descriptions of Associations:Intact  Orientation:Full (Time, Place and Person)  Thought Content:Illogical; Perseveration  Diagnosis of Schizophrenia or Schizoaffective disorder in past: No    Hallucinations:Hallucinations: Visual Description of Visual Hallucinations: ceiling shapes and black lines  Ideas of Reference:None  Suicidal Thoughts:Suicidal Thoughts: No (denies) SI Active Intent and/or Plan: With Intent; With Plan  Homicidal Thoughts:Homicidal Thoughts: No   Sensorium  Memory: Immediate Fair  Judgment: Impaired  Insight: None   Executive Functions  Concentration: Fair  Attention Span: Fair  Recall: [employer] of Knowledge: Fair  Language: Fair   Psychomotor Activity  Psychomotor Activity: Psychomotor Activity: Restlessness; Increased   Assets  Assets: Desire for Improvement; Resilience   Sleep  Sleep: Sleep: Poor  No Safety Checks orders active in given range  Nutritional Assessment (For OBS and FBC admissions only) Has the patient had a weight loss or gain of 10 pounds or more in the last 3 months?: No Has the patient had a decrease in food intake/or appetite?: No Does the patient have dental problems?: No Does the patient have eating habits or behaviors that may be indicators of an eating disorder including binging or inducing vomiting?: No Has the patient recently lost weight without trying?: 0 Has the patient been eating poorly because of a decreased appetite?: 0 Malnutrition Screening Tool Score: 0    Physical Exam  Physical Exam Vitals reviewed.  Constitutional:      General: He is in acute distress.     Appearance: He is ill-appearing and diaphoretic.  Cardiovascular:     Rate and  Rhythm: Tachycardia present.  Pulmonary:     Effort: Respiratory distress present.  Neurological:     Mental Status: He is alert.    Review of Systems  Constitutional:  Positive for diaphoresis.  Psychiatric/Behavioral:  Positive for hallucinations.        Ceiling shapes and black lines, visual  All other systems reviewed and are negative.  Blood pressure (!) 131/100, pulse (!) 115, temperature 98.4 F (36.9 C), temperature source Oral, resp. rate 18, SpO2 100%. There is no height or weight on file to calculate BMI.  Demographic Factors:  Male, Low socioeconomic status, and Access to firearms  Loss Factors: Loss of  significant relationship and Decline in physical health  Historical Factors: Impulsivity  Risk Reduction Factors:   Sense of responsibility to family, Employed, Living with another person, especially a relative, and Positive social support  Continued Clinical Symptoms:  Depression:   Comorbid alcohol abuse/dependence Impulsivity Insomnia Severe Alcohol/Substance Abuse/Dependencies More than one psychiatric diagnosis Unstable or Poor Therapeutic Relationship Previous Psychiatric Diagnoses and Treatments  Cognitive Features That Contribute To Risk:  Closed-mindedness, Loss of executive function, and Polarized thinking    Suicide Risk:  Severe: Patient had [employer] called on him for threatening suicide with a gun.  Police had to remove the gun.  He denies any suicidal intent this morning.  Patient has very poor insight and judgment at this time and cannot control his alcohol intake, which puts him at high risk of impulsive decision making and worsening depressed mood.  Plan Of Care/Follow-up recommendations:   Follow-up recommendations:  Activity:  Normal, as tolerated Diet:  Per PCP recommendation  Patient is instructed prior to discharge to: Take all medications as prescribed by his mental healthcare provider. Report any adverse effects and/or reactions from the  medicines to his outpatient provider promptly. Patient has been instructed & cautioned: To not engage in alcohol and or illegal drug use while on prescription medicines.  In the event of worsening symptoms, patient is instructed to call the crisis hotline at 988, 911 and or go to the nearest ED for appropriate evaluation and treatment of symptoms. To follow-up with his primary care provider for your other medical issues, concerns and or health care needs.   Disposition: Most of [facility] emergency department, with plan to discharge to [facility].  Avyukth Bontempo, MD 05/17/2024, 3:30 PM

## 2024-05-17 NOTE — H&P (Signed)
 Hospital Admission History and Physical Service Pager: [phone]  Patient name: Russell Hansen Medical record number: [id number] Date of Birth: April 20, 1981 Age: 43 y.o. Gender: male  Primary Care Provider: Clotilda Leaven, NP Consultants: None Code Status: Full, discussed with the patient Preferred Emergency Contact: [place] Pa [phone]  Chief Complaint: Alcohol withdrawal  Differential and Medical Decision Making:  Russell Hansen is a 43 y.o. male presenting from [facility] with concern for alcohol withdrawal, in the setting of suicidal ideation. Alcohol withdrawal with hallucinations - mild symptoms, CIWA protocol plus as needed Ativan  based on scores. SI - doubt patient continues to be risk of harm to self; however, will continue virtual sitter for 24 hours Communicated fractures of the 4th and 5th metacarpal - currently splinted, palpable digits warm, well-perfused, consult hand surgery in a.m.  Assessment & Plan Alcohol withdrawal (HCC) Hallucinations may represent early delirium tremens; however, otherwise mild withdrawal symptoms.  No history of seizures, last drink 4 PM 07/24. -Admit to family medicine teaching service, attending Dr. Rosalynn, med/tele - CIWA protocol - As needed Ativan  per protocol - Thiamine  and folic acid  supplementation - TOC consult for substance use - Can escalate to scheduled benzodiazepine if clinically worsening. 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 - Toradol  15 mg every 6 hours as needed - Consult hand surgery Alcoholic hepatitis Fatty liver Mild LFT pattern consistent with alcoholic  hepatitis.  Fatty liver seen on ultrasound from 07/17.  Bilirubin mildly elevated.  No known varices - Likely chronic - Trend CMP - INR pending T wave inversion in EKG New T wave inversions V5 and V6.  Doubt ACS, no chest pain, may be exertionally/tachycardia induced. - Repeat EKG in a.m. - Troponin  Chronic health problem GERD - continue Protonix  20 mg daily Chronic sciatica - continue home gabapentin  Tobacco abuse-14 mg nicotine patch Hypertension-continue amlodipine   FEN/GI: Regular diet VTE Prophylaxis: Lovenox  Disposition: Med/tele  History of Present Illness:  Russell Hansen is a 43 y.o. male:  Presented from the ED today from [place] with concern for alcohol withdrawal.  He was taken to [facility] by law enforcement after threatening suicide with a firearm.   He was involuntarily committed. Of note he additionally had a punching injury to his right hand.  That has been placed in an ulnar gutter splint.  He was scheduled to have surgery today with a hand surgeon.  Patient reports he has had increased amount of stress at home and with constant arguments with his wife. He has been constantly frustrated over the past several weeks which is why he punched the wall and resulted in his hand fractures. He reports that the suicidal ideation with his firearm was a mistake from yesterday, he denies any current thought of harming himself or others. He reports his last drink was yesterday at 4 PM.  He states he drinks 2-3 shots of vodka per day.  Sometimes has beer. He reports he has not been told he has had alcoholic seizures. But he does endorse an episode 1 time where he woke up in the night shaking, he did not lose consciousness. He has not had nausea or vomiting. He does note he has visual hallucinations when  he stares at something for too long, the square on the ceiling will turn into an animal or other object.  He denies auditory hallucinations.  In the ED, patient was tachycardic  and hypertensive.  He reportedly does have hallucinations.  He received 1 mg of Ativan  orally.  UDS was positive for buprenorphine and marijuana.  Ethanol level was 127.  Review Of Systems: Per HPI  Pertinent Past Medical History: Alcohol abuse Suicidal ideation Anxiety Depression Remainder reviewed in history tab.   Pertinent Past Surgical History: Multiple orthopedic surgeries  Remainder reviewed in history tab.   Pertinent Social History: Tobacco use: Up to 5 cigars/day for 20 years Alcohol use: Per HPI Other Substance use: Denies other substance use, however UDS positive for buprenorphine and marijuana Lives with wife, daughters  Pertinent Family History:  Remainder reviewed in history tab.   Important Outpatient Medications: Protonix  Propranolol Potassium Sildenafil Tramadol  Zofran  Amlodipine Remainder reviewed in medication history.   Objective: BP (!) 125/102   Pulse (!) 103   Temp 97.9 F (36.6 C) (Oral)   Resp 20   Ht 5' 8 (1.727 m)   Wt 66.7 kg   SpO2 100%   BMI 22.35 kg/m  Exam: General: A&O, NAD, mildly tremulous, lying comfortably in hospital bed HEENT: No sign of trauma, EOM grossly intact, moist mucous membranes Cardiac: Tachycardic, no m/r/g Respiratory: CTAB, normal WOB, no w/c/r GI: Soft, NTTP, non-distended, no rebound or guarding Extremities: NTTP, no peripheral edema, right fifth toe buddy taped, 2nd and 3rd digits of right hand warm well-perfused, hand in ulnar gutter splint Neuro: Moves all four extremities appropriately Psych: Appropriate mood and affect, not responding to internal stimuli   Labs:  CBC BMET  Recent Labs  Lab 05/17/24 0837  WBC 7.0  HGB 15.0  HCT 42.5  PLT 240   Recent Labs  Lab 05/17/24 0837  NA 138  K 4.0  CL 98  CO2 22  BUN 6  CREATININE 0.89  GLUCOSE 89  CALCIUM 10.5*     UDS was positive for buprenorphine and marijuana.   Ethanol level was 127.   EKG: Sinus tachycardia, TWI  V5V6   Imaging Studies Performed:  No new imaging   Alba Sharper, MD 05/17/2024, 5:17 PM PGY-3, [facility] Health Family Medicine  FPTS Intern pager: [phone], text pages welcome Secure chat group [facility] Teaching Service

## 2024-05-17 NOTE — Progress Notes (Signed)
 CALL PAGER [phone] for any questions or notifications regarding this patient  FMTS Attending Note: Camie Mulch MD I have discussed this patient with the resident on call, reviewed the patient's chart. Agree with admission. Full H&P is pending. Briefly:   Patient p[laced under IVC by [employer]/initially seen at [facility] and transferred to [facility] for admission. A/P Acute alcohol withdrawal: some hallucinations, jittery. Last drink 7/24 by report. Received a total of 3 mg lorazepam  at [facility] earlier today.   - - Admit - CIWA protocol for acute withdrawal. 2.   Patient is currently under IVC (see psych notes from earlier today). Paperwork given to ED Licensed conveyancer. 3.  Comminuted fractures 4th and 5th metacarpal (boxer's fracture) right hand, seen by ortho tech and placed in ulnar gutter splint. Was supposed to have hand surgery today but did not go. Will have hand surgeon consult. He is stable in splint at this time. 4.  Toe fracture. No intervention needed at this time

## 2024-05-17 NOTE — ED Notes (Signed)
 IVC paperwork handed off to Diplomatic Services operational officer [place]

## 2024-05-17 NOTE — Assessment & Plan Note (Signed)
 Hallucinations may represent early delirium tremens; however, otherwise mild withdrawal symptoms.  No history of seizures, last drink 4 PM 07/24. -Admit to family medicine teaching service, attending Dr. Rosalynn, med/tele - CIWA protocol - As needed Ativan  per protocol - Thiamine  and folic acid  supplementation - TOC consult for substance use - Can escalate to scheduled benzodiazepine if clinically worsening.

## 2024-05-17 NOTE — ED Notes (Signed)
 Pt left with EMS for [facility]

## 2024-05-17 NOTE — ED Notes (Signed)
 Pt reports his hallucinations are back. Every time he looks at the closet he sees men dancing around.

## 2024-05-17 NOTE — Assessment & Plan Note (Signed)
 New T wave inversions V5 and V6.  Doubt ACS, no chest pain, may be exertionally/tachycardia induced. - Repeat EKG in a.m. - Troponin

## 2024-05-17 NOTE — ED Notes (Signed)
IVC paperwork in progress 

## 2024-05-17 NOTE — Assessment & Plan Note (Signed)
 Currently in ulnar gutter splint, ORIF originally planned for today at [facility]. - Toradol  15 mg every 6 hours as needed - Consult hand surgery

## 2024-05-17 NOTE — Assessment & Plan Note (Signed)
 GERD - continue Protonix  20 mg daily Chronic sciatica - continue home gabapentin  Tobacco abuse-14 mg nicotine patch Hypertension-continue amlodipine

## 2024-05-17 NOTE — Plan of Care (Signed)
 FMTS Interim Progress Note  S: Patient seen resting comfortably in bed w/ ulnar gutter splint on R hand. He endorses ongoing visual hallucinations that he has experienced periodically throughout the last year, but denies other acute symptoms tonight. He endorsed one prior seizure like episode during a prior episode of alcohol withdrawal, in which he describes waking up with his body shaking for multiple minutes before rolling back over to go to sleep. Discussed his care including start of librium  taper to manage his withdrawal, meeting w/ hand in the AM and nature of his IVC. Patient expressed understanding and is agreeable w/ plan for tonight.   O: BP (!) 136/92 (BP Location: Right Arm)   Pulse 87   Temp 98.4 F (36.9 C)   Resp 18   Ht 5' 8 (1.727 m)   Wt 66.7 kg   SpO2 100%   BMI 22.35 kg/m   General: Awake, alert, NAD. Communicates clearly. Appears jittery w/o acute agitation.   Cardio: RRR. Normal S1, S2. No murmur, rub, gallop.   Resp: CTA bilaterally. No wheezes, rales, or rhonchi. Normal work of breathing on room air.  Psych: Intermittent brief periods of non linear thought process, otherwise mood and affect appropriate.   A/P: Alcohol Withdrawal Last drink 4pm 7/24, endorsing ongoing visual hallucinations.  -S/p 3 mg of lorazepam  7/25 -Initiating chlordiazepoxide  taper w/ 25mg  QID first 24h  Manon Jester, DO 05/17/2024, 11:32 PM PGY-1, [facility] Health Family Medicine Service pager [phone]

## 2024-05-17 NOTE — Discharge Instructions (Addendum)

## 2024-05-17 NOTE — ED Triage Notes (Signed)
 Pt was BIB [facility] from [facility] with concerns of hallucinations. He initially went to [facility] because wife  saw pt with gun in his hand after an argument. Ativan  1mg  and Zofran  given at 2pm. Pt has a history of alcohol abuse. Patient denies feeling unsafe.   140/100 130 HR 99%Spo2

## 2024-05-17 NOTE — Progress Notes (Signed)
 Orthopedic Tech Progress Note Patient Details:  Russell Hansen 12/12/1980 [id number]  Ortho Devices Type of Ortho Device: Ulna gutter splint Ortho Device/Splint Location: RUE Ortho Device/Splint Interventions: Ordered, Application, Adjustment   Post Interventions Patient Tolerated: Well  Adine MARLA Blush 05/17/2024, 4:32 PM

## 2024-05-17 NOTE — ED Notes (Signed)
 Pt in bed w/eyes closed resting quietly. Respirations equal and unlabored. No signs or symptoms of distress. Routine safety checks maintained/ongoing.

## 2024-05-17 NOTE — ED Notes (Signed)
 Pt noted to have increase in movements, nausea sweating and hallucinations. And elevated BP and pulse 131/100 116   Providers aware. Ativan  1 mg IM ordered and administered at 1405  Pt will be transferred to [facility] via EMS for ETOH withdrawal hallucinations

## 2024-05-17 NOTE — Assessment & Plan Note (Signed)
 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

## 2024-05-17 NOTE — ED Notes (Signed)
 At approximately 1306, observed actively hallucinating, pointing to ceiling. Eyes wide.   Reported seeing black lines and shapes.   Ativan  1 mg po given at 1311.  Providers made aware

## 2024-05-17 NOTE — ED Notes (Signed)
 RN brought IVC documents to Secretary's attention just now.

## 2024-05-17 NOTE — ED Provider Notes (Signed)
 Onset EMERGENCY DEPARTMENT AT [facility] Provider Note   CSN: [id number] Arrival date & time: 05/17/24  1516     Patient presents with: Hallucinations   Russell Hansen is a 43 y.o. male.   Patient is a 43 year old male who presents with alcohol withdrawal and hallucinations.  He has been at behavioral health urgent care since yesterday.  He has a history of alcohol abuse and reportedly had gotten a gun yesterday and was threatening to harm himself.  He was IVC by [employer] and brought to the behavioral health urgent care.  It was determined that he meets criteria for inpatient behavioral health care.  This afternoon he had developed some tremors, tachycardia and was having some hallucinations.  He states that he is seeing things on the wall that are not there.  He does feel little tremulous.  He has had alcohol withdrawal in the past and thinks he had 1 seizure back in March related to alcohol withdrawal.  He denies any vomiting.       Prior to Admission medications   Medication Sig Start Date End Date Taking? Authorizing Provider  amLODipine (NORVASC) 5 MG tablet Take 5 mg by mouth daily. 04/19/24  Yes [provider]  cholecalciferol (VITAMIN D3) 25 MCG (1000 UNIT) tablet Take 1,000 Units by mouth daily.   Yes [provider]  cyanocobalamin  (VITAMIN B12) 1000 MCG tablet Take 1,000 mcg by mouth daily. 04/30/24  Yes [provider]  escitalopram (LEXAPRO) 5 MG tablet Take 5 mg by mouth daily. 04/30/24 07/29/24 Yes [provider]  gabapentin  (NEURONTIN ) 300 MG capsule Take 300 mg by mouth 3 (three) times daily. 05/08/24  Yes [provider]  ibuprofen  (ADVIL ) 800 MG tablet Take 1 tablet (800 mg total) by mouth every 8 (eight) hours as needed. Patient taking differently: Take 800 mg by mouth every 8 (eight) hours as needed for mild pain (pain score 1-3). 05/15/24  Yes Ward, Kristen N, DO  magnesium  oxide (MAG-OX) 400 (240 Mg) MG tablet  Take 400 mg by mouth 2 (two) times daily.   Yes [provider]  ondansetron  (ZOFRAN -ODT) 4 MG disintegrating tablet Take 1 tablet (4 mg total) by mouth every 8 (eight) hours as needed for nausea or vomiting. 12/31/23  Yes Corlis Burnard DEL, NP  pantoprazole  (PROTONIX ) 20 MG tablet Take 1 tablet (20 mg total) by mouth daily. 04/27/24 04/27/25 Yes Waymond Lorelle Cummins, MD  potassium chloride  SA (KLOR-CON  M) 20 MEQ tablet Take 1 tablet (20 mEq total) by mouth 2 (two) times daily. Patient taking differently: Take 20 mEq by mouth daily. 01/03/24  Yes Arlander Charleston, MD  propranolol (INDERAL) 10 MG tablet Take 10 mg by mouth 4 (four) times daily as needed (anxiety or fast heart rate). 04/30/24 04/30/25 Yes [provider]  sildenafil (REVATIO) 20 MG tablet Take 20-60 mg by mouth daily as needed (ED). 02/02/24  Yes [provider]  traMADol  (ULTRAM ) 50 MG tablet Take 50 mg by mouth every 8 (eight) hours as needed for moderate pain (pain score 4-6). 05/15/24  Yes [provider]    Allergies: Doxycycline, Norco [hydrocodone -acetaminophen ], and Oxycodone     Review of Systems  Constitutional:  Positive for fatigue. Negative for chills, diaphoresis and fever.  HENT:  Negative for congestion, rhinorrhea and sneezing.   Eyes: Negative.   Respiratory:  Negative for cough, chest tightness and shortness of breath.   Cardiovascular:  Negative for chest pain and leg swelling.  Gastrointestinal:  Positive  for nausea. Negative for abdominal pain, blood in stool, diarrhea and vomiting.  Genitourinary:  Negative for difficulty urinating, flank pain, frequency and hematuria.  Musculoskeletal:  Negative for arthralgias and back pain.  Skin:  Negative for rash.  Neurological:  Positive for tremors. Negative for dizziness, speech difficulty, weakness, numbness and headaches.  Psychiatric/Behavioral:  Positive for hallucinations and suicidal ideas.     Updated Vital Signs BP (!) 137/93   Pulse 93    Temp 98 F (36.7 C) (Oral)   Resp 20   Ht 5' 8 (1.727 m)   Wt 66.7 kg   SpO2 100%   BMI 22.35 kg/m   Physical Exam Constitutional:      Appearance: He is well-developed.  HENT:     Head: Normocephalic and atraumatic.  Eyes:     Pupils: Pupils are equal, round, and reactive to light.  Cardiovascular:     Rate and Rhythm: Regular rhythm. Tachycardia present.     Heart sounds: Normal heart sounds.  Pulmonary:     Effort: Pulmonary effort is normal. No respiratory distress.     Breath sounds: Normal breath sounds. No wheezing or rales.  Chest:     Chest wall: No tenderness.  Abdominal:     General: Bowel sounds are normal.     Palpations: Abdomen is soft.     Tenderness: There is no abdominal tenderness. There is no guarding or rebound.  Musculoskeletal:        General: Normal range of motion.     Cervical back: Normal range of motion and neck supple.  Lymphadenopathy:     Cervical: No cervical adenopathy.  Skin:    General: Skin is warm and dry.     Findings: No rash.  Neurological:     General: No focal deficit present.     Mental Status: He is alert and oriented to person, place, and time.     Comments: Mild tremor present     (all labs ordered are listed, but only abnormal results are displayed) Labs Reviewed  MAGNESIUM   COMPREHENSIVE METABOLIC PANEL WITH GFR    EKG: None  Radiology: No results found.   Procedures   Medications Ordered in the ED  thiamine  (VITAMIN B1) tablet 100 mg (100 mg Oral Given 05/17/24 1651)    Or  thiamine  (VITAMIN B1) injection 100 mg ( Intravenous See Alternative 05/17/24 1651)  multivitamin with minerals tablet 1 tablet (1 tablet Oral Given 05/17/24 1651)  amLODipine (NORVASC) tablet 5 mg (has no administration in time range)  propranolol (INDERAL) tablet 10 mg (has no administration in time range)  escitalopram (LEXAPRO) tablet 5 mg (has no administration in time range)  pantoprazole  (PROTONIX ) EC tablet 20 mg (has no  administration in time range)  gabapentin  (NEURONTIN ) capsule 300 mg (has no administration in time range)  enoxaparin (LOVENOX) injection 40 mg (has no administration in time range)  LORazepam  (ATIVAN ) tablet 1-4 mg (has no administration in time range)    Or  LORazepam  (ATIVAN ) injection 1-4 mg (has no administration in time range)  folic acid  (FOLVITE ) tablet 1 mg (has no administration in time range)  ketorolac  (TORADOL ) 15 MG/ML injection 15 mg (has no administration in time range)  lactated ringers  bolus 1,000 mL (has no administration in time range)                                    Medical Decision  Making Amount and/or Complexity of Data Reviewed Labs: ordered.  Risk OTC drugs. Prescription drug management. Decision regarding hospitalization.   This patient presents to the ED for concern of hallucinations, this involves an extensive number of treatment options, and is a complaint that carries with it a high risk of complications and morbidity.  I considered the following differential and admission for this acute, potentially life threatening condition.  The differential diagnosis includes alcohol withdrawal, encephalopathy, infection, metabolic abnormality, intracranial hemorrhage, psychiatric disorder  MDM:    Patient is a 43 year old who presents with tremor and hallucinations after he was seen at behavioral health urgent care for alcohol use and risky behavior.  Per notes, he was IVC at behavioral health urgent care.  Here he does have some tremors and is mildly tachycardic.  He reports visual hallucinations.  He is alert and oriented.  No focal neurologic deficits.  Labs reviewed and are nonconcerning from earlier today.  Did not feel that these need to be repeated.  Was started on CIWA protocol.  He does have a recent hand fracture.  He was placed in a ulnar gutter splint after diagnosis in the ED.  However he says he removed the splint.  He is currently not in a splint.   Will replace the ulnar gutter splint.  He also has a toe fracture that was buddy taped.  Discussed with the family medicine resident who will admit the patient for further treatment.  CRITICAL CARE Performed by: Andrea Ness Total critical care time: 60 minutes Critical care time was exclusive of separately billable procedures and treating other patients. Critical care was necessary to treat or prevent imminent or life-threatening deterioration. Critical care was time spent personally by me on the following activities: development of treatment plan with patient and/or surrogate as well as nursing, discussions with consultants, evaluation of patient's response to treatment, examination of patient, obtaining history from patient or surrogate, ordering and performing treatments and interventions, ordering and review of laboratory studies, ordering and review of radiographic studies, pulse oximetry and re-evaluation of patient's condition.   (Labs, imaging, consults)  Labs: I Ordered, and personally interpreted labs.  The pertinent results include: Electrolytes nonconcerning, normal white count  Imaging Studies ordered: I ordered imaging studies including hand x-rays, reviewed from prior recent ED visit. I independently visualized and interpreted imaging. I agree with the radiologist interpretation  Additional history obtained from chart.  External records from outside source obtained and reviewed including prior ED notes and behavioral health notes  Cardiac Monitoring: The patient was maintained on a cardiac monitor.  If on the cardiac monitor, I personally viewed and interpreted the cardiac monitored which showed an underlying rhythm of: Sinus tachycardia  Reevaluation: After the interventions noted above, I reevaluated the patient and found that they have :improved  Social Determinants of Health:  alcohol use  Disposition: Admit to hospital  Co morbidities that complicate the patient  evaluation  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   Urine test positive  for microalbuminuria 09/05/2017   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   Vitamin D deficiency 09/04/2017     Medicines Meds ordered this encounter  Medications   DISCONTD: LORazepam  (ATIVAN ) tablet 1-4 mg    CIWA-AR < 5 =:   0 mg    CIWA-AR 5 -10 =:   1 mg    CIWA-AR 11 -15 =:   2 mg    CIWA-AR 16 -20 =:   3 mg    CIWA-AR 16 -20 =:   Recheck CIWA-AR in 1 hour; if > 20 notify MD    CIWA-AR > 20 =:   4 mg    CIWA-AR > 20 =:   Call Rapid Response   DISCONTD: LORazepam  (ATIVAN ) injection 1-4 mg    CIWA-AR < 5 =:   0 mg    CIWA-AR 5 -10 =:   1 mg    CIWA-AR 11 -15 =:   2 mg    CIWA-AR 16 -20 =:   3 mg    CIWA-AR 16 -20 =:   Recheck CIWA-AR in 1 hour; if > 20 notify MD    CIWA-AR > 20 =:   4 mg    CIWA-AR > 20 =:   Call Rapid Response   OR Linked Order Group    thiamine  (VITAMIN B1) tablet 100 mg    thiamine  (VITAMIN B1) injection 100 mg   DISCONTD: folic acid  (FOLVITE ) tablet 1 mg   multivitamin with minerals tablet 1 tablet   amLODipine (NORVASC) tablet 5 mg   propranolol (INDERAL) tablet 10 mg   escitalopram (LEXAPRO) tablet 5 mg   pantoprazole  (PROTONIX ) EC tablet 20 mg   gabapentin  (NEURONTIN ) capsule 300 mg   enoxaparin (LOVENOX) injection 40 mg   OR Linked Order Group    LORazepam  (ATIVAN ) tablet 1-4 mg     CIWA-AR < 5 =:   0 mg     CIWA-AR 5 -10 =:   1 mg     CIWA-AR 11 -15 =:   2 mg     CIWA-AR 16 -20 =:   3 mg     CIWA-AR 16 -20 =:   Recheck CIWA-AR in 1 hour; if > 20 notify MD      CIWA-AR > 20 =:   4 mg     CIWA-AR > 20 =:   Call Rapid Response    LORazepam  (ATIVAN ) injection 1-4 mg     CIWA-AR < 5 =:   0 mg     CIWA-AR 5 -10 =:   1 mg     CIWA-AR 11 -15 =:   2 mg     CIWA-AR 16 -20 =:   3 mg     CIWA-AR 16 -20 =:   Recheck CIWA-AR in 1 hour; if > 20 notify MD     CIWA-AR > 20 =:   4 mg     CIWA-AR > 20 =:   Call Rapid Response   DISCONTD: thiamine  (VITAMIN B1) tablet 100 mg   DISCONTD: thiamine  (VITAMIN B1) injection 100 mg   folic acid  (FOLVITE ) tablet 1 mg   DISCONTD: multivitamin with minerals tablet 1 tablet   ketorolac  (TORADOL ) 15 MG/ML injection 15 mg   lactated ringers  bolus 1,000 mL    I have reviewed the patients home medicines and have made adjustments as needed  Problem List / ED Course: Problem List Items Addressed This Visit       Other   * (Principal) Alcohol withdrawal (HCC) -  Primary   Other Visit Diagnoses       Closed fracture of right hand, initial encounter                    Final diagnoses:  Alcohol withdrawal syndrome without complication (HCC)  Closed fracture of right hand, initial encounter    ED Discharge Orders     None          Lenor Hollering, MD 05/17/24 2003

## 2024-05-17 NOTE — ED Notes (Signed)
 While assessing patient regarding past Hs of ETOH withdrawals.  Pt shared that he thought he may have had a seizure (prior to going to Rehab in march) because he woke up and his whole body was shaking. Pt reported that he is no longer a daily drinker,  does not drink as much at a time and no longer drinks liquor straight.   CIWA score 3

## 2024-05-18 LAB — COMPREHENSIVE METABOLIC PANEL WITH GFR
ALT: 64 U/L — ABNORMAL HIGH (ref 0–44)
AST: 152 U/L — ABNORMAL HIGH (ref 15–41)
Albumin: 3.8 g/dL (ref 3.5–5.0)
Alkaline Phosphatase: 74 U/L (ref 38–126)
Anion gap: 10 (ref 5–15)
BUN: 7 mg/dL (ref 6–20)
CO2: 24 mmol/L (ref 22–32)
Calcium: 9.2 mg/dL (ref 8.9–10.3)
Chloride: 100 mmol/L (ref 98–111)
Creatinine, Ser: 0.82 mg/dL (ref 0.61–1.24)
GFR, Estimated: >60 mL/min (ref >60)
Glucose, Bld: 74 mg/dL (ref 70–99)
Potassium: 4 mmol/L (ref 3.5–5.1)
Sodium: 134 mmol/L — ABNORMAL LOW (ref 135–145)
Total Bilirubin: 1.8 mg/dL — ABNORMAL HIGH (ref 0.0–1.2)
Total Protein: 6.3 g/dL — ABNORMAL LOW (ref 6.5–8.1)

## 2024-05-18 LAB — MAGNESIUM: Magnesium: 1.3 mg/dL — ABNORMAL LOW (ref 1.7–2.4)

## 2024-05-18 MED ORDER — MELATONIN 5 MG PO TABS
5.0000 mg | ORAL_TABLET | Freq: Once | ORAL | Status: AC
  Administered 2024-05-19: 5 mg via ORAL
  Filled 2024-05-18: qty 1

## 2024-05-18 MED ORDER — MAGNESIUM SULFATE 2 GM/50ML IV SOLN
2.0000 g | Freq: Once | INTRAVENOUS | Status: AC
  Administered 2024-05-18: 2 g via INTRAVENOUS
  Filled 2024-05-18: qty 50

## 2024-05-18 NOTE — Assessment & Plan Note (Signed)
 Currently in ulnar gutter splint, ORIF originally planned for today at [facility]. - Toradol  15 mg every 6 hours as needed - Consult hand surgery

## 2024-05-18 NOTE — Assessment & Plan Note (Signed)
 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

## 2024-05-18 NOTE — Assessment & Plan Note (Signed)
 GERD - continue Protonix  20 mg daily Chronic sciatica - continue home gabapentin  Tobacco abuse-14 mg nicotine patch Hypertension-continue amlodipine

## 2024-05-18 NOTE — Assessment & Plan Note (Addendum)
 Hallucinations may represent early delirium tremens; however, otherwise mild withdrawal symptoms.  No history of seizures, last drink 4 PM 07/24. Last CIWA 1.  -Admit to family medicine teaching service, attending Dr. Rosalynn, med/tele - CIWA protocol - Chlordiazepoxide  25mg  taper starting 7/25PM  - Continue Thiamine  and folic acid  supplementation - TOC consult for substance use

## 2024-05-18 NOTE — Assessment & Plan Note (Addendum)
 New T wave inversions V5 and V6.  Doubt ACS, no chest pain, Trop 6, may be exertionally/tachycardia induced. - Repeat EKG in a.m. pending

## 2024-05-18 NOTE — Progress Notes (Signed)
 Daily Progress Note Intern Pager: [phone]  Patient name: Russell Hansen Medical record number: [id number] Date of birth: 09-09-1981 Age: 43 y.o. Gender: male  Primary Care Provider: Steva Clotilda DEL, NP Consultants: Hand  Code Status: Full  Pt Overview and Major Events to Date:  7/25-admitted 7/26-hand consulted  Medical Decision Making: Pt presented from [facility] due to symptoms of alcohol withdrawal, with recent R 4th and 5th metatarsal fracture and SI prompting IVC.   Alcohol withdrawal: Endorsed ongoing hallucinations, prompting initiation of librium  25mg  taper 7/25PM w/ ongoing CIWA. SI: Patient currently denies any SI. 12 more hours of sitter.  4th/5th metacarpal comminuted fx: Pt's hand pain well controlled. Consult hand, proceed w/ their recs when safe given active withdrawal.  Assessment & Plan Alcohol withdrawal (HCC) Hallucinations may represent early delirium tremens; however, otherwise mild withdrawal symptoms.  No history of seizures, last drink 4 PM 07/24. Last CIWA 1.  -Admit to family medicine teaching service, attending Dr. Rosalynn, med/tele - CIWA protocol - Chlordiazepoxide  25mg  taper starting 7/25PM  - Continue Thiamine  and folic acid  supplementation - TOC consult for substance use Suicidal ideation Reported SI with firearm is original admission reason to [facility] -Currently involuntarily committed - Telehealth sitter - Suicide precautions - Continue home Lexapro  - Continue home propranolol  Comminuted fracture of neck of 4th metacarpal Comminuted fracture of neck of 5th metacarpal Currently in ulnar gutter splint, ORIF originally planned for today at [place]. - Toradol  15 mg every 6 hours as needed - Consult hand surgery Alcoholic hepatitis Fatty liver Mild LFT pattern consistent with alcoholic hepatitis.  Fatty liver seen on ultrasound from 07/17.  Bilirubin mildly elevated.  No known varices. INR WNL.  - Likely chronic - Trend CMP T wave  inversion in EKG New T wave inversions V5 and V6.  Doubt ACS, no chest pain, Trop 6, may be exertionally/tachycardia induced. - Repeat EKG in a.m. 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    Subjective:  NAEON. Pt denies new withdrawal symptoms. Pain controlled.   Objective: Temp:  [97.9 F (36.6 C)-98.4 F (36.9 C)] 98.4 F (36.9 C) (07/25 2029) Pulse Rate:  [87-115] 87 (07/25 2029) Resp:  [18-20] 18 (07/25 2029) BP: (112-137)/(76-102) 136/92 (07/25 2029) SpO2:  [100 %] 100 % (07/25 2029) FiO2 (%):  [21 %] 21 % (07/25 1918) Weight:  [66.7 kg] 66.7 kg (07/25 1526) Physical Exam: General: Awake, alert, NAD. Communicates clearly. Cardio: RRR. Normal S1, S2. No murmur, rub, gallop.  Resp: CTA bilaterally. No wheezes, rales, or rhonchi. Normal work of breathing on room air. Extremities: R 4th and 5th digits in ulnar gutter splint  Psych: Appropriate mood and affect  Laboratory: Most recent CBC Lab Results  Component Value Date   WBC 7.0 05/17/2024   HGB 15.0 05/17/2024   HCT 42.5 05/17/2024   MCV 95.5 05/17/2024   PLT 240 05/17/2024   Most recent BMP    Latest Ref Rng & Units 05/17/2024    8:37 AM  BMP  Glucose 70 - 99 mg/dL 89   BUN 6 - 20 mg/dL 6   Creatinine 9.38 - 8.75 mg/dL 9.10   Sodium 864 - 854 mmol/L 138   Potassium 3.5 - 5.1 mmol/L 4.0   Chloride 98 - 111 mmol/L 98   CO2 22 -  32 mmol/L 22   Calcium 8.9 - 10.3 mg/dL 89.4   Trop 6 UDS w/ marijuana and buprenorphine PT 14.2 INR 1 AST 126 ALT 83 T Bili 1.3  Imaging/Diagnostic Tests: None  Manon Jester, DO 05/18/2024, 12:18 AM  PGY-1, [facility] Family [facility] FPTS Intern pager: [phone], text pages welcome Secure chat group [facility] Teaching  Service

## 2024-05-18 NOTE — Assessment & Plan Note (Signed)
 Mild LFT pattern consistent with alcoholic hepatitis.  Fatty liver seen on ultrasound from 07/17.  Bilirubin mildly elevated.  No known varices. INR WNL.  - Likely chronic - Trend CMP

## 2024-05-19 DIAGNOSIS — S62338A Displaced fracture of neck of other metacarpal bone, initial encounter for closed fracture: Secondary | ICD-10-CM | POA: Diagnosis not present

## 2024-05-19 DIAGNOSIS — K7010 Alcoholic hepatitis without ascites: Secondary | ICD-10-CM

## 2024-05-19 DIAGNOSIS — F10932 Alcohol use, unspecified with withdrawal with perceptual disturbance: Secondary | ICD-10-CM | POA: Diagnosis not present

## 2024-05-19 LAB — COMPREHENSIVE METABOLIC PANEL WITH GFR
ALT: 59 U/L — ABNORMAL HIGH (ref 0–44)
AST: 153 U/L — ABNORMAL HIGH (ref 15–41)
Albumin: 3.8 g/dL (ref 3.5–5.0)
Alkaline Phosphatase: 67 U/L (ref 38–126)
Anion gap: 13 (ref 5–15)
BUN: 10 mg/dL (ref 6–20)
CO2: 23 mmol/L (ref 22–32)
Calcium: 9 mg/dL (ref 8.9–10.3)
Chloride: 99 mmol/L (ref 98–111)
Creatinine, Ser: 0.96 mg/dL (ref 0.61–1.24)
GFR, Estimated: >60 mL/min (ref >60)
Glucose, Bld: 91 mg/dL (ref 70–99)
Potassium: 3.7 mmol/L (ref 3.5–5.1)
Sodium: 135 mmol/L (ref 135–145)
Total Bilirubin: 1.2 mg/dL (ref 0.0–1.2)
Total Protein: 6.3 g/dL — ABNORMAL LOW (ref 6.5–8.1)

## 2024-05-19 LAB — MAGNESIUM: Magnesium: 1.6 mg/dL — ABNORMAL LOW (ref 1.7–2.4)

## 2024-05-19 LAB — GLUCOSE, CAPILLARY: Glucose-Capillary: 99 mg/dL (ref 70–99)

## 2024-05-19 NOTE — Assessment & Plan Note (Addendum)
 Stable on Librium  taper. Last hallucination morning of 05/18/24. CIWAs unremarkable overnight.  - Continue Librium  taper - Cont CIWAs - Continue thiamine , folic acid   - TOC consulted - AM CMP, Mg

## 2024-05-19 NOTE — Progress Notes (Addendum)
     Daily Progress Note Intern Pager: [phone]  Patient name: Russell Hansen Medical record number: [id number] Date of birth: 1981-10-23 Age: 43 y.o. Gender: male  Primary Care Provider: Steva Clotilda DEL, NP Consultants: N/a Code Status: Full  Pt Overview and Major Events to Date:  7/25: Admitted to FMTS   Medical Decision Making:  Russell Hansen 43 y.o. admitted for alcohol withdrawal, metatarsal fracture, and SI prompting IVC. PMH includes Anxiety/Depression, SI, alcohol use disorder.  Assessment & Plan Alcohol withdrawal (HCC) Stable on Librium  taper. Last hallucination morning of 05/18/24. CIWAs unremarkable overnight.  - Continue Librium  taper - Cont CIWAs - Continue thiamine , folic acid   - TOC consulted - AM CMP, Mg  Suicidal ideation Denies SI/HI this morning. Remains IVC'ed.  - Cont home lexapro  5 daily, propanolol prn  - 1:1 sitter  - Suicide precautions Comminuted fracture of neck of 4th metacarpal Comminuted fracture of neck of 5th metacarpal Comminuted fractures 4th and 5th metacarpal of R hand. Placed in ulnar gutter splint. - Toradol  15 mg every 6 hours as needed - Consult hand surgery Alcoholic hepatitis Fatty liver Downtrending LFTs. No known varices, INR WNL.  - Trend CMP T wave inversion in EKG T wave inversions V5 and V6 noted on EKG 7/26, appears resolved on EKG today 7/27.  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 Last saw something yesterday morning, no hallucinations since. Never auditory hallucinations. No issues with PO. Voiding normally. No current SI/HI.    Objective: Temp:  [97.8 F (36.6 C)-98.5 F (36.9 C)] 98 F (36.7 C) (07/27  0425) Pulse Rate:  [89-117] 89 (07/27 0425) Resp:  [17-18] 18 (07/27 0425) BP: (119-136)/(86-100) 119/86 (07/27 0425) SpO2:  [99 %-100 %] 99 % (07/27 0425) Physical Exam: General: No acute distress. Resting comfortably in room. CV: Normal S1/S2. No extra heart sounds. Warm and well-perfused. Pulm: Breathing comfortably on room air. CTAB anteriorly. No increased WOB. Abd: Soft, non-tender, non-distended. Skin:  Warm, dry. Psych: Pleasant and appropriate.   Laboratory: Most recent CBC Lab Results  Component Value Date   WBC 7.0 05/17/2024   HGB 15.0 05/17/2024   HCT 42.5 05/17/2024   MCV 95.5 05/17/2024   PLT 240 05/17/2024   CMP     Component Value Date/Time   NA 134 (L) 05/18/2024 0422   K 4.0 05/18/2024 0422   CL 100 05/18/2024 0422   CO2 24 05/18/2024 0422   GLUCOSE 74 05/18/2024 0422   BUN 7 05/18/2024 0422   CREATININE 0.82 05/18/2024 0422   CALCIUM 9.2 05/18/2024 0422   PROT 6.3 (L) 05/18/2024 0422   ALBUMIN 3.8 05/18/2024 0422   AST 152 (H) 05/18/2024 0422   ALT 64 (H) 05/18/2024 0422   ALKPHOS 74 05/18/2024 0422   BILITOT 1.8 (H) 05/18/2024 0422   GFRNONAA >60 05/18/2024 0422     Diona Perkins, MD 05/19/2024, 5:35 AM  PGY-2, [place] [facility] FPTS Intern pager: [phone], text pages welcome Secure chat group [facility] Teaching Service

## 2024-05-19 NOTE — Assessment & Plan Note (Addendum)
 Comminuted fractures 4th and 5th metacarpal of R hand. Placed in ulnar gutter splint. - Toradol  15 mg every 6 hours as needed - Consult hand surgery

## 2024-05-19 NOTE — Assessment & Plan Note (Addendum)
 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

## 2024-05-19 NOTE — Plan of Care (Signed)
   Problem: Clinical Measurements: Goal: Ability to maintain clinical measurements within normal limits will improve Outcome: Progressing Goal: Will remain free from infection Outcome: Progressing Goal: Diagnostic test results will improve Outcome: Progressing Goal: Respiratory complications will improve Outcome: Progressing Goal: Cardiovascular complication will be avoided Outcome: Progressing   Problem: Coping: Goal: Level of anxiety will decrease Outcome: Progressing   Problem: Safety: Goal: Ability to remain free from injury will improve Outcome: Progressing

## 2024-05-19 NOTE — Hospital Course (Signed)
 Russell Hansen is a 43 y.o. year old with a history of Anxiety/Depression, SI, alcohol use disorder who presented with alcohol withdrawal in the setting of acute ideation and was admitted to the [facility] Medicine Teaching Service for same; also with multiple fractures of right hand.  Alcohol withdrawal Patient admitted from [facility] after experiencing visual hallucinations; he was given 1 mg of Ativan  on 7/25 and started on Librium  taper 7/26-30 w/o further withdrawal symptoms. Thiamine  and folic acid  supplemented and TOC consulted for substance use.  Suicidal ideation Patient initially brought to [facility] by law enforcement after threatening suicide with firearm; was IVC'd. Patient denied suicidal ideation at time of admission to [facility]. He was continued on home Lexapro , propranolol . Psych consulted, recommending inpatient psych admission when medically stabilized.   Comminuted fractures of neck of 4th and 5th metacarpals, right hand Patient presented with known fractures of right hand as described. Originally was planning for ORIF on 7/27 before admission. Placed in ulnar gutter splint. Hand surgery was consulted inpatient, and recommended outpatient follow-up, as they did not think inpatient surgery while patient IVC'd was appropriate.  Alcoholic hepatitis / Fatty liver Mild, stable LFT pattern consistent with alcoholic hepatitis on admission. Fatty liver seen on ultrasound from 7/17. Suspected chronic.  Other chronic conditions were medically managed with home medications and formulary alternatives as necessary (GERD, chronic sciatica, tobacco use, HTN).  PCP Follow-up Recommendations: Follow-up alcohol cessation Follow-up mental health/SI Out-patient follow-up with Ortho /hand surgery

## 2024-05-19 NOTE — Plan of Care (Signed)

## 2024-05-19 NOTE — Assessment & Plan Note (Addendum)
 Denies SI/HI this morning. Remains IVC'ed.  - Cont home lexapro  5 daily, propanolol prn  - 1:1 sitter  - Suicide precautions

## 2024-05-19 NOTE — Assessment & Plan Note (Addendum)
 Downtrending LFTs. No known varices, INR WNL.  - Trend CMP

## 2024-05-19 NOTE — Assessment & Plan Note (Addendum)
 T wave inversions V5 and V6 noted on EKG 7/26, appears resolved on EKG today 7/27.

## 2024-05-20 DIAGNOSIS — S62338G Displaced fracture of neck of other metacarpal bone, subsequent encounter for fracture with delayed healing: Secondary | ICD-10-CM | POA: Diagnosis not present

## 2024-05-20 DIAGNOSIS — F329 Major depressive disorder, single episode, unspecified: Secondary | ICD-10-CM

## 2024-05-20 DIAGNOSIS — K7010 Alcoholic hepatitis without ascites: Secondary | ICD-10-CM | POA: Diagnosis not present

## 2024-05-20 DIAGNOSIS — R45851 Suicidal ideations: Secondary | ICD-10-CM

## 2024-05-20 DIAGNOSIS — F332 Major depressive disorder, recurrent severe without psychotic features: Secondary | ICD-10-CM

## 2024-05-20 DIAGNOSIS — F10932 Alcohol use, unspecified with withdrawal with perceptual disturbance: Secondary | ICD-10-CM | POA: Diagnosis not present

## 2024-05-20 LAB — COMPREHENSIVE METABOLIC PANEL WITH GFR
ALT: 63 U/L — ABNORMAL HIGH (ref 0–44)
AST: 125 U/L — ABNORMAL HIGH (ref 15–41)
Albumin: 3.3 g/dL — ABNORMAL LOW (ref 3.5–5.0)
Alkaline Phosphatase: 63 U/L (ref 38–126)
Anion gap: 10 (ref 5–15)
BUN: 18 mg/dL (ref 6–20)
CO2: 26 mmol/L (ref 22–32)
Calcium: 9.1 mg/dL (ref 8.9–10.3)
Chloride: 101 mmol/L (ref 98–111)
Creatinine, Ser: 0.98 mg/dL (ref 0.61–1.24)
GFR, Estimated: >60 mL/min (ref >60)
Glucose, Bld: 95 mg/dL (ref 70–99)
Potassium: 3.7 mmol/L (ref 3.5–5.1)
Sodium: 137 mmol/L (ref 135–145)
Total Bilirubin: 0.5 mg/dL (ref 0.0–1.2)
Total Protein: 5.9 g/dL — ABNORMAL LOW (ref 6.5–8.1)

## 2024-05-20 LAB — MAGNESIUM: Magnesium: 1.5 mg/dL — ABNORMAL LOW (ref 1.7–2.4)

## 2024-05-20 LAB — PHOSPHORUS: Phosphorus: 4.4 mg/dL (ref 2.5–4.6)

## 2024-05-20 MED ORDER — MAGNESIUM OXIDE -MG SUPPLEMENT 400 (240 MG) MG PO TABS
400.0000 mg | ORAL_TABLET | Freq: Once | ORAL | Status: AC
  Administered 2024-05-20: 400 mg via ORAL
  Filled 2024-05-20: qty 1

## 2024-05-20 NOTE — Assessment & Plan Note (Addendum)
 Denies SI/HI this morning. Remains IVC'ed.  - Psych consulted to determine dispo home vs back to behavioral health - Cont home lexapro  5 daily, propanolol prn  - 1:1 sitter  - Suicide precautions

## 2024-05-20 NOTE — Assessment & Plan Note (Addendum)
 Comminuted fractures 4th and 5th metacarpal of R hand. Placed in ulnar gutter splint.  - Toradol  15 mg every 6 hours as needed - F/u for hand surgery outpatient per phone consult w/ hand

## 2024-05-20 NOTE — Plan of Care (Addendum)
 Spoke with Ozell Ned, PA-C regarding 4th and 5th metacarpal fractures.  Recommended to continue ulnar gutter splint and to follow-up with hand surgery outpatient.  Recommended that outpatient surgery take place in the first 1-2 weeks since injury.  DOI 05/15/2024.  Follow up 1:18 PM Spoke with Michael Jeffery via Secure Chat to clarify plan, as there is concern psych will keep patient IVCed and transfer to [facility].  Ozell stated that he spoke with surgeon and patient would not be surgical candidate if IVCed, recommended keeping hand splinted.

## 2024-05-20 NOTE — Assessment & Plan Note (Signed)
 Downtrending LFTs. No known varices, INR WNL.  - Trend CMP

## 2024-05-20 NOTE — Assessment & Plan Note (Addendum)
 T wave inversions V5 and V6 noted on EKG 7/26, appears resolved on EKG 7/27.

## 2024-05-20 NOTE — Assessment & Plan Note (Signed)
 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

## 2024-05-20 NOTE — Plan of Care (Signed)
   Problem: Education: Goal: Knowledge of General Education information will improve Description: Including pain rating scale, medication(s)/side effects and non-pharmacologic comfort measures Outcome: Progressing   Problem: Health Behavior/Discharge Planning: Goal: Ability to manage health-related needs will improve Outcome: Progressing   Problem: Safety: Goal: Ability to remain free from injury will improve Outcome: Progressing
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
## 2024-05-20 NOTE — Assessment & Plan Note (Addendum)
 Stable on Librium  taper. Last hallucination 05/18/24. CIWAs unremarkable overnight.  - Continue Librium  taper, day 4/5 - Cont CIWAs - Continue thiamine , folic acid   - TOC consulted - AM CMP, Mg - Added phosphorus to eval for refeeding - 4.4

## 2024-05-20 NOTE — Plan of Care (Signed)

## 2024-05-20 NOTE — Progress Notes (Signed)
   05/20/24 0719  Assess: MEWS Score  Temp 97.6 F (36.4 C)  BP 107/85  MAP (mmHg) 93  Pulse Rate (!) 111  SpO2 100 %  O2 Device Room Air  Assess: MEWS Score  MEWS Temp 0  MEWS Systolic 0  MEWS Pulse 2  MEWS RR 0  MEWS LOC 0  MEWS Score 2  MEWS Score Color Yellow  Assess: if the MEWS score is Yellow or Red  Were vital signs accurate and taken at a resting state? Yes  Does the patient meet 2 or more of the SIRS criteria? No  MEWS guidelines implemented  No, previously yellow, continue vital signs every 4 hours  Notify: Charge Nurse/RN  Name of Charge Nurse/RN Notified Lauren RN  Assess: SIRS CRITERIA  SIRS Temperature  0  SIRS Respirations  0  SIRS Pulse 1  SIRS WBC 0  SIRS Score Sum  1

## 2024-05-21 ENCOUNTER — Inpatient Hospital Stay (HOSPITAL_COMMUNITY)
Admission: AD | Admit: 2024-05-21 | Discharge: 2024-05-24 | DRG: 885 | Disposition: A | Discharge status: Home / Self Care | Source: Intra-hospital | Attending: Psychiatry | Admitting: Psychiatry

## 2024-05-21 ENCOUNTER — Other Ambulatory Visit: Payer: Self-pay

## 2024-05-21 ENCOUNTER — Other Ambulatory Visit (HOSPITAL_COMMUNITY): Payer: Self-pay

## 2024-05-21 DIAGNOSIS — F10939 Alcohol use, unspecified with withdrawal, unspecified: Secondary | ICD-10-CM

## 2024-05-21 DIAGNOSIS — F17203 Nicotine dependence unspecified, with withdrawal: Secondary | ICD-10-CM | POA: Diagnosis present

## 2024-05-21 DIAGNOSIS — Z79899 Other long term (current) drug therapy: Secondary | ICD-10-CM

## 2024-05-21 DIAGNOSIS — K219 Gastro-esophageal reflux disease without esophagitis: Secondary | ICD-10-CM | POA: Diagnosis present

## 2024-05-21 DIAGNOSIS — S62338G Displaced fracture of neck of other metacarpal bone, subsequent encounter for fracture with delayed healing: Secondary | ICD-10-CM | POA: Diagnosis not present

## 2024-05-21 DIAGNOSIS — Z8659 Personal history of other mental and behavioral disorders: Secondary | ICD-10-CM | POA: Diagnosis not present

## 2024-05-21 DIAGNOSIS — K7010 Alcoholic hepatitis without ascites: Secondary | ICD-10-CM | POA: Diagnosis present

## 2024-05-21 DIAGNOSIS — I10 Essential (primary) hypertension: Secondary | ICD-10-CM | POA: Diagnosis present

## 2024-05-21 DIAGNOSIS — F332 Major depressive disorder, recurrent severe without psychotic features: Secondary | ICD-10-CM | POA: Diagnosis present

## 2024-05-21 DIAGNOSIS — F322 Major depressive disorder, single episode, severe without psychotic features: Secondary | ICD-10-CM | POA: Diagnosis not present

## 2024-05-21 DIAGNOSIS — E519 Thiamine deficiency, unspecified: Secondary | ICD-10-CM | POA: Diagnosis present

## 2024-05-21 DIAGNOSIS — R45851 Suicidal ideations: Secondary | ICD-10-CM | POA: Diagnosis present

## 2024-05-21 DIAGNOSIS — F419 Anxiety disorder, unspecified: Secondary | ICD-10-CM | POA: Diagnosis present

## 2024-05-21 DIAGNOSIS — Z881 Allergy status to other antibiotic agents status: Secondary | ICD-10-CM

## 2024-05-21 DIAGNOSIS — F1290 Cannabis use, unspecified, uncomplicated: Secondary | ICD-10-CM | POA: Insufficient documentation

## 2024-05-21 DIAGNOSIS — F1020 Alcohol dependence, uncomplicated: Secondary | ICD-10-CM | POA: Diagnosis present

## 2024-05-21 DIAGNOSIS — Z841 Family history of disorders of kidney and ureter: Secondary | ICD-10-CM

## 2024-05-21 DIAGNOSIS — Z885 Allergy status to narcotic agent status: Secondary | ICD-10-CM | POA: Diagnosis not present

## 2024-05-21 DIAGNOSIS — F1210 Cannabis abuse, uncomplicated: Secondary | ICD-10-CM | POA: Diagnosis present

## 2024-05-21 DIAGNOSIS — R748 Abnormal levels of other serum enzymes: Secondary | ICD-10-CM | POA: Diagnosis present

## 2024-05-21 LAB — COMPREHENSIVE METABOLIC PANEL WITH GFR
ALT: 70 U/L — ABNORMAL HIGH (ref 0–44)
AST: 132 U/L — ABNORMAL HIGH (ref 15–41)
Albumin: 3.4 g/dL — ABNORMAL LOW (ref 3.5–5.0)
Alkaline Phosphatase: 60 U/L (ref 38–126)
Anion gap: 8 (ref 5–15)
BUN: 16 mg/dL (ref 6–20)
CO2: 24 mmol/L (ref 22–32)
Calcium: 9.2 mg/dL (ref 8.9–10.3)
Chloride: 108 mmol/L (ref 98–111)
Creatinine, Ser: 0.72 mg/dL (ref 0.61–1.24)
GFR, Estimated: >60 mL/min (ref >60)
Glucose, Bld: 74 mg/dL (ref 70–99)
Potassium: 3.9 mmol/L (ref 3.5–5.1)
Sodium: 140 mmol/L (ref 135–145)
Total Bilirubin: 0.8 mg/dL (ref 0.0–1.2)
Total Protein: 5.8 g/dL — ABNORMAL LOW (ref 6.5–8.1)

## 2024-05-21 LAB — MAGNESIUM: Magnesium: 1.8 mg/dL (ref 1.7–2.4)

## 2024-05-21 MED ORDER — GABAPENTIN 300 MG PO CAPS
300.0000 mg | ORAL_CAPSULE | Freq: Three times a day (TID) | ORAL | Status: DC
  Administered 2024-05-22 – 2024-05-24 (×8): 300 mg via ORAL
  Filled 2024-05-21 (×8): qty 1

## 2024-05-21 MED ORDER — MAGNESIUM HYDROXIDE 400 MG/5ML PO SUSP: 30.0000 mL | Freq: Every day | ORAL | Status: DC | PRN

## 2024-05-21 MED ORDER — HALOPERIDOL LACTATE 5 MG/ML IJ SOLN: 5.0000 mg | Freq: Three times a day (TID) | INTRAMUSCULAR | Status: DC | PRN

## 2024-05-21 MED ORDER — DIPHENHYDRAMINE HCL 50 MG/ML IJ SOLN: 50.0000 mg | Freq: Three times a day (TID) | INTRAMUSCULAR | Status: DC | PRN

## 2024-05-21 MED ORDER — ALUM & MAG HYDROXIDE-SIMETH 200-200-20 MG/5ML PO SUSP: 30.0000 mL | ORAL | Status: DC | PRN

## 2024-05-21 MED ORDER — VITAMIN B-1 100 MG PO TABS
100.0000 mg | ORAL_TABLET | Freq: Every day | ORAL | Status: DC
  Administered 2024-05-22 – 2024-05-24 (×3): 100 mg via ORAL
  Filled 2024-05-21 (×3): qty 1

## 2024-05-21 MED ORDER — LORAZEPAM 2 MG/ML IJ SOLN: 2.0000 mg | Freq: Three times a day (TID) | INTRAMUSCULAR | Status: DC | PRN

## 2024-05-21 MED ORDER — AMLODIPINE BESYLATE 5 MG PO TABS: 5.0000 mg | ORAL_TABLET | Freq: Every day | ORAL | Status: DC

## 2024-05-21 MED ORDER — FOLIC ACID 1 MG PO TABS
1.0000 mg | ORAL_TABLET | Freq: Every day | ORAL | Status: DC
  Administered 2024-05-22 – 2024-05-24 (×3): 1 mg via ORAL
  Filled 2024-05-21 (×3): qty 1

## 2024-05-21 MED ORDER — DIPHENHYDRAMINE HCL 25 MG PO CAPS: 50.0000 mg | ORAL_CAPSULE | Freq: Three times a day (TID) | ORAL | Status: DC | PRN

## 2024-05-21 MED ORDER — THIAMINE HCL 100 MG/ML IJ SOLN
100.0000 mg | Freq: Every day | INTRAMUSCULAR | Status: DC
  Filled 2024-05-21: qty 2

## 2024-05-21 MED ORDER — THIAMINE HCL 100 MG PO TABS: 100.0000 mg | ORAL_TABLET | Freq: Every day | ORAL | Status: DC

## 2024-05-21 MED ORDER — ADULT MULTIVITAMIN W/MINERALS CH: 1.0000 | ORAL_TABLET | Freq: Every day | ORAL | Status: DC

## 2024-05-21 MED ORDER — NICOTINE 14 MG/24HR TD PT24: 14.0000 mg | MEDICATED_PATCH | Freq: Every day | TRANSDERMAL | Status: DC

## 2024-05-21 MED ORDER — HALOPERIDOL LACTATE 5 MG/ML IJ SOLN: 10.0000 mg | Freq: Three times a day (TID) | INTRAMUSCULAR | Status: DC | PRN

## 2024-05-21 MED ORDER — ADULT MULTIVITAMIN W/MINERALS CH
1.0000 | ORAL_TABLET | Freq: Every day | ORAL | Status: DC
  Administered 2024-05-22 – 2024-05-24 (×3): 1 via ORAL
  Filled 2024-05-21 (×3): qty 1

## 2024-05-21 MED ORDER — PANTOPRAZOLE SODIUM 20 MG PO TBEC
20.0000 mg | DELAYED_RELEASE_TABLET | Freq: Every day | ORAL | Status: DC
  Administered 2024-05-22 – 2024-05-24 (×3): 20 mg via ORAL
  Filled 2024-05-21 (×3): qty 1

## 2024-05-21 MED ORDER — FOLIC ACID 1 MG PO TABS
1.0000 mg | ORAL_TABLET | Freq: Every day | ORAL | 0 refills | Status: DC
  Filled 2024-05-21: qty 30, 30d supply, fill #0

## 2024-05-21 MED ORDER — NAPROXEN 250 MG PO TABS
500.0000 mg | ORAL_TABLET | Freq: Two times a day (BID) | ORAL | Status: DC
  Administered 2024-05-21: 500 mg via ORAL
  Filled 2024-05-21 (×2): qty 2

## 2024-05-21 MED ORDER — AMLODIPINE BESYLATE 5 MG PO TABS
5.0000 mg | ORAL_TABLET | Freq: Every day | ORAL | Status: DC
  Administered 2024-05-22 – 2024-05-23 (×2): 5 mg via ORAL
  Filled 2024-05-21 (×2): qty 1

## 2024-05-21 MED ORDER — HALOPERIDOL 5 MG PO TABS
5.0000 mg | ORAL_TABLET | Freq: Three times a day (TID) | ORAL | Status: DC | PRN
Start: 2024-05-21 — End: 2024-05-24

## 2024-05-21 MED ORDER — PROPRANOLOL HCL 10 MG PO TABS
10.0000 mg | ORAL_TABLET | Freq: Four times a day (QID) | ORAL | Status: DC | PRN
  Administered 2024-05-22 (×2): 10 mg via ORAL
  Filled 2024-05-21 (×2): qty 1

## 2024-05-21 MED ORDER — NICOTINE 14 MG/24HR TD PT24
14.0000 mg | MEDICATED_PATCH | Freq: Every day | TRANSDERMAL | 0 refills | Status: DC
  Filled 2024-05-21: qty 28, 28d supply, fill #0

## 2024-05-21 MED ORDER — NAPROXEN 500 MG PO TABS
500.0000 mg | ORAL_TABLET | Freq: Two times a day (BID) | ORAL | Status: DC
  Administered 2024-05-22 – 2024-05-24 (×5): 500 mg via ORAL
  Filled 2024-05-21 (×5): qty 1

## 2024-05-21 MED ORDER — ESCITALOPRAM OXALATE 5 MG PO TABS
5.0000 mg | ORAL_TABLET | Freq: Every day | ORAL | Status: DC
  Administered 2024-05-22 – 2024-05-24 (×3): 5 mg via ORAL
  Filled 2024-05-21 (×3): qty 1

## 2024-05-21 MED ORDER — FOLIC ACID 1 MG PO TABS: 1.0000 mg | ORAL_TABLET | Freq: Every day | ORAL | Status: DC

## 2024-05-21 MED ORDER — THIAMINE HCL 100 MG PO TABS
100.0000 mg | ORAL_TABLET | Freq: Every day | ORAL | 0 refills | Status: DC
  Filled 2024-05-21: qty 30, 30d supply, fill #0

## 2024-05-21 MED ORDER — NICOTINE 14 MG/24HR TD PT24
14.0000 mg | MEDICATED_PATCH | Freq: Every day | TRANSDERMAL | Status: DC
  Filled 2024-05-21 (×2): qty 1

## 2024-05-21 NOTE — Progress Notes (Signed)
     Daily Progress Note Intern Pager: [phone]  Patient name: Russell Hansen Medical record number: [id number] Date of birth: [birth year] Age: 43 y.o. Gender: male  Primary Care Provider: Steva Clotilda DEL, NP Consultants: Psych, hand Code Status: Full  Pt Overview and Major Events to Date:  7/25-admitted 7/29-completes Librium  taper  Medical Decision Making: 43 year old male admitted from [facility] due to alcohol withdrawal significant for visual hallucinations.  Patient was IVC after SI/HI at home.  Right boxer's fracture after punching a wall.  Pertinent history includes anxiety, depression, alcohol use disorder, suicidal ideation. Assessment & Plan Alcohol withdrawal (HCC) Stable on Librium  taper. Last hallucination 05/18/24. CIWAs unremarkable overnight.  - Continue Librium  taper, day 5/5 - Cont CIWAs - Continue thiamine , folic acid   - TOC consulted - AM CMP, Mg Comminuted fracture of neck of 4th metacarpal Comminuted fracture of neck of 5th metacarpal Comminuted fractures 4th and 5th metacarpal of R hand. Placed in ulnar gutter splint.  - Naproxen  500 mg 2 times daily with meals - Ortho tech consulted to replace splint - F/u outpatient for further management Suicidal ideation Denies SI/HI this morning. Remains IVC'ed.  - Psych consulted to determine dispo home vs back to behavioral health, pending - Cont home lexapro  5 daily, propanolol prn  - 1:1 sitter  - Suicide precautions Alcoholic hepatitis Fatty liver Stable LFTs. No known varices, INR WNL.  - Trend CMP Chronic health problem GERD - continue Protonix  20 mg daily Chronic sciatica - continue home gabapentin  300 TID  Tobacco abuse- cont 14 mg nicotine  patch Hypertension- continue amlodipine  5 daily  Severe episode of recurrent major depressive disorder, without psychotic features (HCC)   FEN/GI: Regular PPx: Lovenox  Dispo: Pending psych consult  Subjective:  No acute events overnight.  No agitation or  visual hallucinations.  Patient reporting moderate pain in his right hand, discussed the management of his 4th and 5th metacarpal fractures.  Objective: Temp:  [97.5 F (36.4 C)-98.1 F (36.7 C)] 98.1 F (36.7 C) (07/28 2200) Pulse Rate:  [73-111] 95 (07/28 2200) Resp:  [18] 18 (07/28 2200) BP: (96-127)/(74-88) 107/81 (07/28 2200) SpO2:  [100 %] 100 % (07/28 2200) Physical Exam: General: Awake, alert, NAD. Communicates clearly. Cardio: RRR. Normal S1, S2. No murmur, rub, gallop.  Resp: CTA bilaterally. No wheezes, rales, or rhonchi. Normal work of breathing on room air  Laboratory: Most recent CBC Lab Results  Component Value Date   WBC 7.0 05/17/2024   HGB 15.0 05/17/2024   HCT 42.5 05/17/2024   MCV 95.5 05/17/2024   PLT 240 05/17/2024   Most recent BMP    Latest Ref Rng & Units 05/21/2024    6:09 AM  BMP  Glucose 70 - 99 mg/dL 74   BUN 6 - 20 mg/dL 16   Creatinine 9.38 - 1.24 mg/dL 9.27   Sodium 864 - 854 mmol/L 140   Potassium 3.5 - 5.1 mmol/L 3.9   Chloride 98 - 111 mmol/L 108   CO2 22 - 32 mmol/L 24   Calcium 8.9 - 10.3 mg/dL 9.2   AST 867 ALT 70 alkaline phosphatase 60  Imaging/Diagnostic Tests:  None   Manon Jester, DO 05/21/2024, 7:16 AM  PGY-1, [facility] FPTS Intern pager: [phone], text pages welcome Secure chat group [facility] Teaching Service

## 2024-05-21 NOTE — Plan of Care (Signed)
  Problem: Clinical Measurements: Goal: Ability to maintain clinical measurements within normal limits will improve Outcome: Progressing Goal: Will remain free from infection Outcome: Completed/Met Goal: Diagnostic test results will improve Outcome: Progressing Goal: Respiratory complications will improve Outcome: Completed/Met Goal: Cardiovascular complication will be avoided Outcome: Completed/Met   Problem: Activity: Goal: Risk for activity intolerance will decrease Outcome: Completed/Met   Problem: Nutrition: Goal: Adequate nutrition will be maintained Outcome: Completed/Met   Problem: Coping: Goal: Level of anxiety will decrease Outcome: Progressing   Problem: Elimination: Goal: Will not experience complications related to bowel motility Outcome: Progressing Goal: Will not experience complications related to urinary retention Outcome: Progressing   Problem: Pain Managment: Goal: General experience of comfort will improve and/or be controlled Outcome: Progressing

## 2024-05-21 NOTE — Assessment & Plan Note (Addendum)
 Comminuted fractures 4th and 5th metacarpal of R hand. Placed in ulnar gutter splint.  - Naproxen  500 mg 2 times daily with meals - Ortho tech consulted to replace splint - F/u outpatient for further management

## 2024-05-21 NOTE — Progress Notes (Signed)
 Patient DC meds picked up from [facility] pharmacy and placed outside room in drawer

## 2024-05-21 NOTE — TOC Transition Note (Signed)
 Transition of Care [facility]) - Discharge Note   Patient Details  Name: Russell Hansen MRN: [id number] Date of Birth: 1980-12-02  Transition of Care [facility] CM/SW Contact:  Bridget Cordella Simmonds, LCSW Phone Number: 05/21/2024, 4:04 PM   Clinical Narrative:   Pt will admit to [facility], room 400-2.  RN call report to [phone].  [facility] cannot receive pt until after 1900.  [facility] will transport at that time.  Please call [facility] non emergency line: [phone] when ready to request they come and pick up pt.    Final next level of care: [facility] Barriers to Discharge: Barriers Resolved   Patient Goals and CMS Choice            Discharge Placement                       Discharge Plan and Services Additional resources added to the After Visit Summary for                                       Social Drivers of Health (SDOH) Interventions SDOH Screenings   Food Insecurity: No Food Insecurity (05/17/2024)  Housing: Unknown (05/19/2024)  Transportation Needs: No Transportation Needs (05/17/2024)  Utilities: Not At Risk (05/17/2024)  Depression (PHQ2-9): Low Risk  (07/26/2023)  Financial Resource Strain: Low Risk  (05/06/2024)   Received from [facility]  Physical Activity: Sufficiently Active (04/30/2024)   Received from [facility]  Social Connections: Moderately Integrated (04/30/2024)   Received from [facility]  Stress: Stress Concern Present (04/30/2024)   [facility] from [facility]  Tobacco Use: High Risk (05/17/2024)  Health Literacy: Adequate Health Literacy (04/30/2024)   Received from [facility]     Readmission Risk Interventions     No data to display

## 2024-05-21 NOTE — Progress Notes (Signed)
 RN Called [facility] to give report and they stated to call back and give report to night shift when patient is on the way.

## 2024-05-21 NOTE — Discharge Summary (Addendum)
 Family Medicine Teaching [facility] Discharge Summary       Transfer to Other Inpatient Institution   Discharge Diagnoses/Problem List:  Principal Problem for Admission: Alcohol withdrawal Other Problems addressed during stay:  Principal Problem:   Alcohol withdrawal (HCC) Active Problems:   Suicidal ideation   Chronic health problem   Comminuted fracture of neck of 4th metacarpal   Comminuted fracture of neck of 5th metacarpal   Alcoholic hepatitis   Fatty liver   Severe episode of recurrent major depressive disorder, without psychotic features [facility])  Brief Hospital Course:  Russell Hansen is a 43 y.o. year old with a history of Anxiety/Depression, SI, alcohol use disorder who presented with alcohol withdrawal in the setting of acute ideation and was admitted to the [facility] Medicine Teaching Service for same; also with multiple fractures of right hand.  Alcohol withdrawal Patient admitted from [facility] after experiencing visual hallucinations; he was given 1 mg of Ativan  on 7/25 and started on Librium  taper 7/26-30 w/o further withdrawal symptoms.  Placed on CIWA protocol throughout admission.  Thiamine  and folic acid  supplemented and TOC consulted for substance use.  Suicidal ideation Patient initially brought to [facility] by law enforcement after threatening suicide with firearm; was IVC'd. Patient denied suicidal ideation at time of admission to [facility]. He was continued on home Lexapro , propranolol . Psych consulted, recommending inpatient psych admission when medically stabilized.   Comminuted fractures of neck of 4th and 5th metacarpals, right hand Presented to ED 2 days earlier prior to admission after punching a wall.  X-ray showed comminuted fractures of the neck of the 4th and 5th metacarpals of his right hand.  He was placed in ulnar gutter splint.  He went home took off the splint and then return for admission.  Patient was resplinted in the ED.  Patient was  potentially plan for outpatient surgical ORIF.  Hand surgery was consulted inpatient, and recommended outpatient follow-up.  Alcoholic hepatitis / Fatty liver Mild, stable LFT pattern consistent with alcoholic hepatitis on admission. Fatty liver seen on ultrasound from 7/17. Suspected chronic.  Other chronic conditions were medically managed with home medications and formulary alternatives as necessary (GERD, chronic sciatica, tobacco use, HTN).  PCP Follow-up Recommendations: Follow-up alcohol cessation Follow-up mental health/SI Out-patient follow-up with Ortho /hand surgery     Results/Tests Pending at Time of Discharge:  Unresulted Labs (From admission, onward)    None       Disposition: Transfer to inpatient psych  Discharge Condition: Stable, withdrawal resolved.   Discharge Exam:  Vitals:   05/20/24 1727 05/20/24 2200  BP: 127/88 107/81  Pulse: 73 95  Resp: 18 18  Temp: (!) 97.5 F (36.4 C) 98.1 F (36.7 C)  SpO2: 100% 100%   General: Awake, alert, NAD. Communicates clearly. HEENT: NCAT. PERRLA EOMI. Anicteric sclera, conjunctiva clear. MMM, clear OP w/ no exudates or erythema.  Cardio: RRR. Normal S1, S2. No murmur, rub, gallop. 2+ radial and dorsalis pedis pulses b/l w/ good capillary refill. No LE edema Resp: CTA bilaterally. No wheezes, rales, or rhonchi. Normal work of breathing on room air Abdomen: soft, non-tender, non-distended. Normoactive BS auscultated. No guarding, rigidity, or rebound.  Extremities: Full ROM w/ no TTP or obvious deformity. R hand in new ulnar gutter splint over 4th and 5th digits.  Neuro: AOx4. Bilateral UE and LE strength 5/5. Psych: Appropriate mood and affect. No SI/HI.   Significant Procedures: Splinting of right boxer's fracture  Significant Labs and Imaging:  No results  for input(s): WBC, HGB, HCT, PLT in the last 48 hours. Recent Labs  Lab 05/20/24 0714 05/21/24 0609  NA 137 140  K 3.7 3.9  CL 101 108  CO2  26 24  GLUCOSE 95 74  BUN 18 16  CREATININE 0.98 0.72  CALCIUM 9.1 9.2  MG 1.5* 1.8  ALKPHOS 63 60  AST 125* 132*  ALT 63* 70*  ALBUMIN 3.3* 3.4*    Discharge Medications:  Allergies as of 05/21/2024       Reactions   Doxycycline Itching   Norco [hydrocodone -acetaminophen ] Itching   Oxycodone  Itching        Medication List     STOP taking these medications    cholecalciferol  25 MCG (1000 UNIT) tablet Commonly known as: VITAMIN D3   magnesium  oxide 400 (240 Mg) MG tablet Commonly known as: MAG-OX   ondansetron  4 MG disintegrating tablet Commonly known as: ZOFRAN -ODT   potassium chloride  SA 20 MEQ tablet Commonly known as: KLOR-CON  M   traMADol  50 MG tablet Commonly known as: ULTRAM        TAKE these medications    amLODipine  5 MG tablet Commonly known as: NORVASC  Take 5 mg by mouth daily.   cyanocobalamin  1000 MCG tablet Commonly known as: VITAMIN B12 Take 1,000 mcg by mouth daily.   escitalopram  5 MG tablet Commonly known as: LEXAPRO  Take 5 mg by mouth daily.   folic acid  1 MG tablet Commonly known as: FOLVITE  Take 1 tablet (1 mg total) by mouth daily. Start taking on: May 22, 2024   gabapentin  300 MG capsule Commonly known as: NEURONTIN  Take 300 mg by mouth 3 (three) times daily.   ibuprofen  800 MG tablet Commonly known as: ADVIL  Take 1 tablet (800 mg total) by mouth every 8 (eight) hours as needed. What changed: reasons to take this   multivitamin with minerals Tabs tablet Take 1 tablet by mouth daily. Start taking on: May 22, 2024   nicotine  14 mg/24hr patch Commonly known as: NICODERM CQ  - dosed in mg/24 hours Place 1 patch (14 mg total) onto the skin daily. Start taking on: May 22, 2024   pantoprazole  20 MG tablet Commonly known as: Protonix  Take 1 tablet (20 mg total) by mouth daily.   propranolol  10 MG tablet Commonly known as: INDERAL  Take 10 mg by mouth 4 (four) times daily as needed (anxiety or fast heart rate).    sildenafil 20 MG tablet Commonly known as: REVATIO Take 20-60 mg by mouth daily as needed (ED).   thiamine  100 MG tablet Commonly known as: VITAMIN B1 Take 1 tablet (100 mg total) by mouth daily. Start taking on: May 22, 2024        Discharge Instructions: Please refer to Patient Instructions section of EMR for full details.  Patient was counseled important signs and symptoms that should prompt return to medical care, changes in medications, dietary instructions, activity restrictions, and follow up appointments.   Follow-Up Appointments:   Follow-up Information     Steva Clotilda DEL, NP. Schedule an appointment as soon as possible for a visit in 1 week(s).   Specialty: Family Medicine Contact information: [address] [place] [address] [phone]         Gini Rockey BRAVO, MD. Schedule an appointment as soon as possible for a visit in 1 week(s).   Specialty: Orthopedic Surgery Contact information: [address] [place] [address] [phone]  Manon Jester, DO 05/21/2024, 2:55 PM PGY-1, [facility]

## 2024-05-21 NOTE — TOC CAGE-AID Note (Signed)
 Transition of Care [facility]) - CAGE-AID Screening   Patient Details  Name: Russell Hansen MRN: [id number] Date of Birth: Aug 03, 1981  Transition of Care [facility]) CM/SW Contact:    Bridget Cordella Simmonds, LCSW Phone Number: 05/21/2024, 11:48 AM   Clinical Narrative: CSW met with pt for Cage Aid.  Pt reports he is currently in treatment with [facility].  He was also in residential treatment in 01/2024.  He did relapse on ETOH around June 2025 but has been addressing this in his [facility] groups.  Also attends AA.  Pt would like to continue treatment with [facility] at discharge.      CAGE-AID Screening:    Have You Ever Felt You Ought to Cut Down on Your Drinking or Drug Use?: Yes Have People Annoyed You By Critizing Your Drinking Or Drug Use?: Yes Have You Felt Bad Or Guilty About Your Drinking Or Drug Use?: Yes Have You Ever Had a Drink or Used Drugs First Thing In The Morning to Steady Your Nerves or to Get Rid of a Hangover?: Yes CAGE-AID Score: 4  Substance Abuse Education Offered: Yes  Substance abuse interventions: Other (must comment) (already active with [facility])

## 2024-05-21 NOTE — Assessment & Plan Note (Addendum)
 Stable LFTs. No known varices, INR WNL.  - Trend CMP

## 2024-05-21 NOTE — Plan of Care (Signed)
 Spoke with patient regarding his right hand fractures.  Clarified for patient that his injury can heal back to normal function with or without surgery, and that his level of pain would likely be similar regardless of management.  Emphasized the importance of continued splinting up to a month after the injury to ensure proper healing.  Patient expressed understanding, was appreciative of new splint applied this afternoon, was reassured that he would not have further right hand limitations provided that his fracture heals appropriately.

## 2024-05-21 NOTE — Assessment & Plan Note (Signed)
 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

## 2024-05-21 NOTE — Assessment & Plan Note (Addendum)
 Denies SI/HI this morning. Remains IVC'ed.  - Psych consulted to determine dispo home vs back to behavioral health, pending - Cont home lexapro  5 daily, propanolol prn  - 1:1 sitter  - Suicide precautions

## 2024-05-21 NOTE — Progress Notes (Signed)
 Pt. Has picked up by transport

## 2024-05-21 NOTE — Consult Note (Signed)
  Collateral information was obtained from the patient's wife to further assess risk. We reviewed risk factors, including endorsement of suicidal ideation several days prior to the suicide threat with a plan to shoot himself with a gun. He has access to means, a family history of suicide completion, recent losses, isolation, a history of substance abuse, and is recently recovering but has increased alcohol intake, resulting in alcohol withdrawal complications requiring medical care. Collectively, these factors place him at moderate to high risk for suicide completion. The team determined he is best suited for inpatient psychiatric hospitalization with the possibility of step-down to partial hospitalization or outpatient psychiatric therapy once stable. Arrangements have been made for psychiatric inpatient admission, and the involuntary commitment will remain in place at this time. The patient has been accepted to [facility] for admission this evening, and all admission orders have been placed. The patient's wife has been notified, and all questions, comments, and concerns were addressed, including what to expect during admission, visitation guidelines, attire, personal belongings, and phone call procedures.

## 2024-05-21 NOTE — Consult Note (Addendum)
 [facility] Psychiatric Consult Initial  Patient Name: .Russell Hansen  MRN: [id number]  DOB: Apr 22, 1981  Consult Order details:  Orders (From admission, onward)     Start     Ordered   05/20/24 1204  IP CONSULT TO PSYCHIATRY       Ordering Provider: Manon Jester, DO  Provider:  (Not yet assigned)  Question Answer Comment  Location [facility]   Reason for Consult? Etoh intoxication, day 4/5 of librium  taper, HI/SI under IVC. Medically stable for discharge 7/28.      05/20/24 1205             Mode of Visit: In person    Psychiatry Consult Evaluation  Service Date: May 21, 2024 LOS:  LOS: 4 days  Chief Complaint under IVC, suicidal ideations and homicidal ideations  Primary Psychiatric Diagnoses  MDD, recurrent, severe 2.  Alcohol use recurrent disorder, moderate, dependence 3.  Passive suicidal ideation  Assessment  Russell Hansen is a 43 y.o. male admitted: Medicallyfor 05/17/2024  3:16 PM for alcohol withdraw. He carries the psychiatric diagnoses of alcohol use disorder and MDD and has a past medical history of  Denies.   His initial presentation of depression with suicidal ideation and alcohol use relapse is most consistent with major depressive disorder, recurrent, moderate, and alcohol use disorder, active. He meets criteria for major depressive disorder based on depressed mood, anhedonia, social withdrawal, feelings of hopelessness, worthlessness, guilt, and passive suicidal thoughts. Current outpatient psychotropic medications include none reported and historically he has had a limited response to prior interventions, including an inpatient rehabilitation program that did not sustain abstinence. He was not consistently compliant with treatment recommendations prior to admission as evidenced by resumption of alcohol use, minimization of drinking, and disengagement from supportive services. On initial examination, patient was calm, cooperative,  oriented 3, with constricted affect and improved mood compared to admission; he denied active suicidal or homicidal ideations and was able to contract for safety.   05/20/2024: Patient seen and assessed by psychiatric provider for a new psychiatric consult. The patient was initially admitted for alcohol withdrawal complications and placed under involuntary commitment after making suicidal statements towards himself and initially reporting homicidal ideations towards his family. He states he became frustrated and acknowledges being very frustrated over the past couple of months with multiple stressors and feeling tired. He admits to making suicidal statements, describing anhedonia and passive death wishes, such as being indifferent about waking up. He reports that on Thursday, everything came to a head, noting that he always checks his gun, and on that occasion, he had the gun out, closed the door, and said, "I wish I was not here anymore," prompting his wife to call 911. He now states he does not intend to harm himself and attributes the statement to frustration. Since admission, he reports having a period of self-reflection. Triggers identified include his daughter graduating from college and driving his vehicle, limited transportation preventing him from attending open gym as he previously did, and ongoing conflict with his mother, whom he describes as possessive and controlling, especially with his finances. He endorses depressive symptoms including sadness, isolation, guilt, hopelessness, worthlessness, and feeling unappreciated despite his efforts. He clarifies he never threatened his family and expresses relief after hospitalization. The patient endorses resumption of alcohol use, with his last drink on Wednesday consisting of one to two shots of alcohol; however, his blood alcohol level on admission was 127, which he minimized, similar to prior  minimization of alcohol use when his blood alcohol level was  102 in July. He has a history of poor engagement in rehabilitation, with a prior rehab stay from March 16 through April 9. He notes some family history of mental illness. He currently resides with his wife and three children, works as a Education officer, museum, and is out on summer break. At present, he denies any suicidal ideations and is adamant he would never intentionally harm himself. He is able to contract for safety at this time.  05/21/2024: During psychiatric assessment today,  patient had been working with social work team regarding discharge planning.  He is agitated, noting that he is not interested in doing group therapy.  He states he has done group therapy and AA in the past, and has not found value and listening to other people's sad stories.  He does think that 1 on 1 sessions with a psychiatrist could be more beneficial.  He minimizes concern for his mental health which led to his hospital admission.  He remains under involuntary commitment, but is open to a voluntary admission to inpatient psychiatry for 3 days.  He is distrustful, and wants to be in control of his admission.  He requests I guarantee in writing that he would only need to stay for 3 days.  Upon reviewing his ability to sign in voluntarily and use of the 72-hour form, patient feels more comfortable.  He does request for NP Starkes-Perry to discuss planning with his wife.  He states that yesterday they talked about doing a partial hospitalization program, but he does not feel that that would be beneficial for him, noting that he would be unlikely to log on  for group therapy sessions.  He denies suicidal plan at this time.  He verbalizes frustration that he thought he would have an orthopedic surgical procedure for boxer's fracture while hospitalized, but now has been told that he will have to have this completed as an outpatient.  He states that he missed a medical appointment due to this current hospitalization.   Please  see plan below for detailed recommendations.   Diagnoses:  Active Hospital problems: Principal Problem:   Alcohol withdrawal (HCC) Active Problems:   Suicidal ideation   Chronic health problem   Comminuted fracture of neck of 4th metacarpal   Comminuted fracture of neck of 5th metacarpal   Alcoholic hepatitis   Fatty liver   Severe episode of recurrent major depressive disorder, without psychotic features (HCC) Alcohol Use Disorder, Moderate, Dependence (HCC)  Plan   ## Psychiatric Medication Recommendations:  Resume home medications -Continue prn for alcohol detox - Continue to recommend inpatient psychiatric admission.  Defer medication management inpatient psychiatry team. - Patient would be transported under involuntary status, and he desires to sign in voluntarily upon arrival to excepting inpatient psychiatric hospital.  ## Medical Decision Making Capacity: Not specifically addressed in this encounter  ## Further Work-up:  -- B12, Folate, and B1 levels should be obtained in the setting of alcohol detox.  TSH, B12, folate, EKG, While pt on Qtc prolonging medications, please monitor & replete K+ to 4 and Mg2+ to 2, TOC consult for substance abuse resources, U/A, or UDS -- most recent EKG on 07/25 had QtC of 443 -- Pertinent labwork reviewed earlier this admission includes: BAL of 127, UDS + for THC and suboxone, magnesium  1.5, elevated ast/alt 125/63.    ## Disposition:-- Plan Post Discharge/Psychiatric Care Follow-up resources Qualifies for PHP. Will have team to review  and likely dc home tomorrow with wife. In the interim will continue to uphold IVC  ## Behavioral / Environmental: - No specific recommendations at this time.     ## Safety and Observation Level:  - Based on my clinical evaluation, I estimate the patient to be at moderate -high risk of self harm in the current setting. - At this time, we recommend  1:1 Observation. This decision is based on my review of  the chart including patient's history and current presentation, interview of the patient, mental status examination, and consideration of suicide risk including evaluating suicidal ideation, plan, intent, suicidal or self-harm behaviors, risk factors, and protective factors. This judgment is based on our ability to directly address suicide risk, implement suicide prevention strategies, and develop a safety plan while the patient is in the clinical setting. Please contact our team if there is a concern that risk level has changed.  CSSR Risk Category:C-SSRS RISK CATEGORY: High Risk  Suicide Risk Assessment: Patient has following modifiable risk factors for suicide: access to guns, untreated depression, social isolation, lack of access to outpatient mental health resources, active mental illness (to encompass adhd, tbi, mania, psychosis, trauma reaction), current symptoms: anxiety/panic, insomnia, impulsivity, anhedonia, hopelessness, triggering events, and sense of peace/wellbeing, which we are addressing by recommending medications and partial hospitalization program. Patient has following non-modifiable or demographic risk factors for suicide: male gender and family h/o suicide Patient has the following protective factors against suicide: Access to outpatient mental health care, Supportive family, Supportive friends, Cultural, spiritual, or religious beliefs that discourage suicide, Minor children in the home, Frustration tolerance, no history of suicide attempts, and no history of NSSIB  Thank you for this consult request. Recommendations have been communicated to the primary team.  We will continue to follow  at this time.   DOROTHE CHRISTELLA GRIFFINS, MD       History of Present Illness  Relevant Aspects of [place] [facility] Course:  Admitted on 05/17/2024 for alcohol withdraw  Patient Report:  05/20/2024: Patient continues to have increased anxiety and stress related to hospitalization.  He does not feel  that he would benefit from group therapy or substance use rehabilitation services.  He is open to short inpatient psychiatric admission followed by outpatient mental health care with a psychiatrist.  He denies active suicidal ideation, plan, or intent today.    05/21/2024: Patient reports feeling overwhelmed and frustrated over the past several months due to multiple stressors, including limited transportation, conflict with his mother, and changes at home such as his daughter graduating from college and driving his vehicle. He acknowledges making suicidal statements out of frustration but denies any intent to harm himself or others, stating, "I'm not going to take my own life, but I was very frustrated." He describes passive thoughts of death, including wishing he would not wake up, but notes that since admission he has had time for self-reflection and no longer feels suicidal. He denies ever threatening his family and reports feeling relieved after hospitalization. Patient endorses depressive symptoms including sadness, guilt, hopelessness, worthlessness, social withdrawal, and feeling unappreciated. He reports alcohol use relapse prior to admission, last drinking one to two shots on Wednesday, though his blood alcohol level was 127 on admission. He minimizes alcohol use, consistent with prior behavior when admitted with a blood alcohol level of 102 in July. He reports a previous inpatient rehabilitation stay from March 16 to April 9 but poor follow-through after discharge. He currently denies suicidal or homicidal ideations and is  able to contract for safety at this time.   Psych ROS:  The patient endorses depressed mood, anhedonia, feelings of hopelessness, guilt, worthlessness, and social isolation. Reports passive suicidal ideation at the time of admission but denies current suicidal or homicidal thoughts, intent, or plan. Reports frustration and irritability related to psychosocial stressors but denies  any history of manic or hypomanic symptoms, including decreased need for sleep, pressured speech, increased goal-directed activity, or grandiosity. Denies panic attacks, obsessive or intrusive thoughts, compulsive behaviors, hallucinations, or delusional thinking. Reports sleep disturbance related to stress but denies nightmares or flashbacks. No recent changes in appetite or weight beyond what is associated with alcohol use relapse.  Collateral information:  Contacted hargis Mosses at [phone] on 05/20/2024. Collateral information was obtained from the patient's wife. She reports no current safety concerns. She does state that in the days leading up to the incident, the patient had been verbalizing that he did not want to live anymore. On Thursday, during an argument, she recalls him saying, "Get them kids out of here," which she interpreted as him preparing to harm himself and not wanting the children to witness it. She emphasizes that he did not make any threats or statements directed toward her or their children. She reports that the firearm involved has since been confiscated and is safely secured. She expresses support for his decision to pursue inpatient treatment or a partial hospitalization program and has no doubts about his safety at this time.  Review of Systems  Psychiatric/Behavioral:  Positive for depression and substance abuse (alcohol). The patient is nervous/anxious.   All other systems reviewed and are negative.    Psychiatric and Social History  Psychiatric History:  Information collected from Patient and chart review  Prev Dx/Sx: None Current Psych Provider: None Home Meds (current): None Previous Med Trials: None Therapy: None  Prior Psych Hospitalization: Denies  Prior Self Harm: Denies Prior Violence: Denies  Family Psych History: Maternal cousin-schizophrenia Family Hx suicide: Maternal cousin completed suicide in 60 via co2 poisoning  Social History:   Developmental Hx: WNL Educational Hx: College education Occupational Hx: MS Designer, television/film set Hx: None Living Situation:Lives with wife and (3) daughters age 43,16,15 Spiritual Hx: n/a Access to weapons/lethal means: Yes   Substance History Alcohol: Yes, 1-2 shots every now and then  Type of alcohol Liquor Last Drink Wednesday Number of drinks per day 1-2 History of alcohol withdrawal seizures Denies History of DT's Denies Tobacco: Denies Illicit drugs: Denies, Uds positive for [facility] Prescription drug abuse: Denies Rehab hx: [facility] inpatient rehab 3/21-4/09  Exam Findings  Physical Exam: appears age stated, lying in bed with right hand wrapped.  Vital Signs:  Temp:  [97.5 F (36.4 C)-98.1 F (36.7 C)] 98.1 F (36.7 C) (07/28 2200) Pulse Rate:  [73-95] 95 (07/28 2200) Resp:  [18] 18 (07/28 2200) BP: (107-127)/(81-88) 107/81 (07/28 2200) SpO2:  [100 %] 100 % (07/28 2200) Blood pressure 107/81, pulse 95, temperature 98.1 F (36.7 C), temperature source Oral, resp. rate 18, height 5' 8 (1.727 m), weight 66.7 kg, SpO2 100%. Body mass index is 22.35 kg/m.  Physical Exam  Mental Status Exam: General Appearance: Casual and Guarded., wearing scrubs  Orientation:  Full (Time, Place, and Person)  Memory:  Immediate;   Fair Recent;   Good Remote;   Fair  Concentration:  Concentration: Good and Attention Span: Fair  Recall:  Fair  Attention  Fair  Eye Contact:  Fair  Speech:  Clear and Coherent and Normal Rate  Language:  Good  Volume:  Normal  Mood: irritable  Affect:  Labile  Thought Process:  Coherent, Linear, and Descriptions of Associations: Intact  Thought Content:  Hallucinations: None and distrustful  Suicidal Thoughts:  No-denies  Homicidal Thoughts:  No  Judgement:  Fair  Insight:  Present  Psychomotor Activity:  Normal  Akathisia:  No  Fund of Knowledge:  Good      Assets:  Communication Skills Desire for Improvement Financial  Resources/Insurance Housing Intimacy Leisure Time Physical Health Resilience Social Support Talents/Skills Transportation Vocational/Educational  Cognition:  WNL  ADL's:  Intact  AIMS (if indicated):        Other History   These have been pulled in through the EMR, reviewed, and updated if appropriate.  Family History:  The patient's family history includes Kidney disease in his father.  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   Vitamin D  deficiency 09/04/2017    Surgical History: Past Surgical History:  Procedure Laterality Date   CHONDROPLASTY Right 03/05/2018   Procedure: CHONDROPLASTY;  Surgeon: Tobie Priest, MD;  Location: [facility];  Service: Orthopedics;  Laterality: Right;   EYE SURGERY     KNEE ARTHROSCOPY WITH MENISCAL REPAIR Right 03/05/2018   Procedure: KNEE ARTHROSCOPY WITH MENISCAL REPAIR;  Surgeon: Tobie Priest, MD;  Location: [facility];  Service: Orthopedics;  Laterality: Right;   KNEE SURGERY Bilateral 2003, 2013   ACL REPAIR   MOUTH SURGERY     dental implant   PROSTATE BIOPSY N/A 09/02/2021   Procedure: PROSTATE BIOPSY GRAYCE;  Surgeon: Kassie Ozell SAUNDERS, MD;  Location: [facility];  Service: Urology;  Laterality: N/A;   WISDOM TOOTH EXTRACTION       Medications:   Current Facility-Administered Medications:    amLODipine  (NORVASC ) tablet 5 mg, 5 mg, Oral, Daily, Quillen, Michael, MD, 5 mg at 05/21/24 9143   enoxaparin  (LOVENOX ) injection 40  mg, 40 mg, Subcutaneous, Q24H, Quillen, Michael, MD, 40 mg at 05/21/24 9142   escitalopram  (LEXAPRO ) tablet 5 mg, 5 mg, Oral, Daily, Quillen, Michael, MD, 5 mg at 05/21/24 9144   folic acid  (FOLVITE ) tablet 1 mg, 1 mg, Oral, Daily, Quillen, Michael, MD, 1 mg at 05/21/24 9143   gabapentin  (NEURONTIN ) capsule 300 mg, 300 mg, Oral, TID, Quillen, Michael, MD, 300 mg at 05/21/24 9143   multivitamin with minerals tablet 1 tablet, 1 tablet, Oral, Daily, Quillen, Michael, MD, 1 tablet at 05/21/24 9143   naproxen  (NAPROSYN ) tablet 500 mg, 500 mg, Oral, BID WC, Yanuck, Solomon, DO, 500 mg at 05/21/24 1032   nicotine  (NICODERM CQ  - dosed in mg/24 hours) patch 14 mg, 14 mg, Transdermal, Daily, Quillen, Michael, MD, 14 mg at 05/17/24 2312   pantoprazole  (PROTONIX ) EC tablet 20 mg, 20 mg, Oral, Daily, Quillen, Michael, MD, 20 mg at 05/21/24 9143   propranolol  (INDERAL ) tablet 10 mg, 10 mg, Oral, QID PRN, Alba Ozell, MD   thiamine  (  VITAMIN B1) tablet 100 mg, 100 mg, Oral, Daily, 100 mg at 05/21/24 0855 **OR** thiamine  (VITAMIN B1) injection 100 mg, 100 mg, Intravenous, Daily, Quillen, Michael, MD  Allergies: Allergies  Allergen Reactions   Doxycycline Itching   Norco [Hydrocodone -Acetaminophen ] Itching   Oxycodone  Itching    DOROTHE CHRISTELLA GRIFFINS, MD  05/28/2024: Addended note to specify alcohol use disorder and depression severity.  DOROTHE CHRISTELLA GRIFFINS, MD

## 2024-05-21 NOTE — Progress Notes (Signed)
 Orthopedic Tech Progress Note Patient Details:  EDD REPPERT September 16, 1981 [id number]  Ortho Devices Type of Ortho Device: Ace wrap, Cotton web roll, Ulna gutter splint Ortho Device/Splint Location: RUE Ortho Device/Splint Interventions: Ordered, Application, Adjustment   Post Interventions Patient Tolerated: Well Instructions Provided: Care of device  Delanna LITTIE Pac 05/21/2024, 2:43 PM

## 2024-05-21 NOTE — Assessment & Plan Note (Addendum)
 Stable on Librium  taper. Last hallucination 05/18/24. CIWAs unremarkable overnight.  - Continue Librium  taper, day 5/5 - Cont CIWAs - Continue thiamine , folic acid   - TOC consulted - AM CMP, Mg

## 2024-05-21 NOTE — Progress Notes (Signed)
 Report given to Behavioral health and transport called

## 2024-05-22 ENCOUNTER — Encounter (HOSPITAL_COMMUNITY): Payer: Self-pay

## 2024-05-22 ENCOUNTER — Encounter (HOSPITAL_COMMUNITY): Payer: Self-pay | Admitting: Family

## 2024-05-22 DIAGNOSIS — F1020 Alcohol dependence, uncomplicated: Secondary | ICD-10-CM

## 2024-05-22 NOTE — Group Note (Signed)
 Recreation Therapy Group Note   Group Topic:Leisure Education  Group Date: 05/22/2024 Start Time: 1410 End Time: 1500 Facilitators: Latishia Suitt-McCall, LRT,CTRS Location: [address] Hall Dayroom   Activity Description/Intervention: Therapeutic Drumming. Patients with peers and staff were given the opportunity to engage in a leader facilitated [facility] Group Empowerment Drumming Circle with staff from the [employer], in partnership with [employer]. Teaching laboratory technician and trained [employer], Norleen Mon leading with LRT observing and documenting intervention and pt response. This evidenced-based practice targets 7 areas of health and wellbeing in the human experience including: stress-reduction, exercise, self-expression, camaraderie/support, nurturing, spirituality, and music-making (leisure).   Goal Area(s) Addresses:  Patient will engage in pro-social way in music group.  Patient will follow directions of drum leader on the first prompt. Patient will demonstrate no behavioral issues during group.  Patient will identify if a reduction in stress level occurs as a result of participation in therapeutic drum circle.    Education: Leisure exposure, Pharmacologist, Musical expression, Discharge Planning  Affect/Mood: Appropriate   Participation Level: Engaged   Participation Quality: Independent   Behavior: Appropriate   Speech/Thought Process: Focused   Insight: Good   Judgement: Good   Modes of Intervention: Teaching laboratory technician   Patient Response to Interventions:  Engaged   Education Outcome:  In group clarification offered    Clinical Observations/Individualized Feedback: Tevin was engaged as much as he could be despite his injured hand. Pt engaged with peers and staff during group session. Pt appeared to be enjoying himself throughout group.    Plan: Continue to engage patient in RT group sessions 2-3x/week.   Shina Wass-McCall,  LRT,CTRS 05/22/2024 3:57 PM

## 2024-05-22 NOTE — Progress Notes (Addendum)
 1:1 note:  1630: Patient placed on 1:1 supervision by provider due to safety precautions in regard to R wrist ace wrap with splint.   Patient currently calm and cooperative socializing in hallway with sitter by side.

## 2024-05-22 NOTE — BH IP Treatment Plan (Signed)
 Interdisciplinary Treatment and Diagnostic Plan Update  05/22/2024 Time of Session: 1043am Russell Hansen MRN: [id number]  Principal Diagnosis: Alcohol use disorder, moderate, dependence (HCC)  Secondary Diagnoses: Principal Problem:   Alcohol use disorder, moderate, dependence (HCC) Active Problems:   MDD (major depressive disorder), recurrent episode, severe (HCC)   Current Medications:  Current Facility-Administered Medications  Medication Dose Route Frequency Provider Last Rate Last Admin   alum & mag hydroxide-simeth (MAALOX/MYLANTA) 200-200-20 MG/5ML suspension 30 mL  30 mL Oral Q4H PRN Starkes-Perry, Majel RAMAN, FNP       amLODipine  (NORVASC ) tablet 5 mg  5 mg Oral Daily Starkes-Perry, Takia S, FNP   5 mg at 05/22/24 0757   haloperidol  (HALDOL ) tablet 5 mg  5 mg Oral TID PRN Wilkie Majel RAMAN, FNP       And   diphenhydrAMINE  (BENADRYL ) capsule 50 mg  50 mg Oral TID PRN Starkes-Perry, Majel RAMAN, FNP       haloperidol  lactate (HALDOL ) injection 5 mg  5 mg Intramuscular TID PRN Wilkie Majel RAMAN, FNP       And   diphenhydrAMINE  (BENADRYL ) injection 50 mg  50 mg Intramuscular TID PRN Starkes-Perry, Majel RAMAN, FNP       And   LORazepam  (ATIVAN ) injection 2 mg  2 mg Intramuscular TID PRN Starkes-Perry, Majel RAMAN, FNP       haloperidol  lactate (HALDOL ) injection 10 mg  10 mg Intramuscular TID PRN Starkes-Perry, Majel RAMAN, FNP       And   diphenhydrAMINE  (BENADRYL ) injection 50 mg  50 mg Intramuscular TID PRN Starkes-Perry, Majel RAMAN, FNP       And   LORazepam  (ATIVAN ) injection 2 mg  2 mg Intramuscular TID PRN Starkes-Perry, Majel RAMAN, FNP       escitalopram  (LEXAPRO ) tablet 5 mg  5 mg Oral Daily Starkes-Perry, Takia S, FNP   5 mg at 05/22/24 0745   folic acid  (FOLVITE ) tablet 1 mg  1 mg Oral Daily Starkes-Perry, Takia S, FNP   1 mg at 05/22/24 0745   gabapentin  (NEURONTIN ) capsule 300 mg  300 mg Oral TID Starkes-Perry, Takia S, FNP   300 mg at 05/22/24 1100   magnesium  hydroxide  (MILK OF MAGNESIA) suspension 30 mL  30 mL Oral Daily PRN Starkes-Perry, Takia S, FNP       multivitamin with minerals tablet 1 tablet  1 tablet Oral Daily Starkes-Perry, Takia S, FNP   1 tablet at 05/22/24 0745   naproxen  (NAPROSYN ) tablet 500 mg  500 mg Oral BID WC Starkes-Perry, Takia S, FNP   500 mg at 05/22/24 0745   nicotine  (NICODERM CQ  - dosed in mg/24 hours) patch 14 mg  14 mg Transdermal Daily Starkes-Perry, Majel RAMAN, FNP       pantoprazole  (PROTONIX ) EC tablet 20 mg  20 mg Oral Daily Wilkie Majel RAMAN, FNP   20 mg at 05/22/24 0745   propranolol  (INDERAL ) tablet 10 mg  10 mg Oral QID PRN Wilkie Majel RAMAN, FNP   10 mg at 05/22/24 9360   thiamine  (Vitamin B-1) tablet 100 mg  100 mg Oral Daily Wilkie Majel RAMAN, FNP   100 mg at 05/22/24 0745   Or   thiamine  (VITAMIN B1) injection 100 mg  100 mg Intravenous Daily Wilkie Majel RAMAN, FNP       PTA Medications: Medications Prior to Admission  Medication Sig Dispense Refill Last Dose/Taking   amLODipine  (NORVASC ) 5 MG tablet Take 5 mg by mouth daily.  cyanocobalamin  (VITAMIN B12) 1000 MCG tablet Take 1,000 mcg by mouth daily.      escitalopram  (LEXAPRO ) 5 MG tablet Take 5 mg by mouth daily.      folic acid  (FOLVITE ) 1 MG tablet Take 1 tablet (1 mg total) by mouth daily.      gabapentin  (NEURONTIN ) 300 MG capsule Take 300 mg by mouth 3 (three) times daily.      ibuprofen  (ADVIL ) 800 MG tablet Take 1 tablet (800 mg total) by mouth every 8 (eight) hours as needed. (Patient taking differently: Take 800 mg by mouth every 8 (eight) hours as needed for mild pain (pain score 1-3).) 30 tablet 0    Multiple Vitamin (MULTIVITAMIN WITH MINERALS) TABS tablet Take 1 tablet by mouth daily.      nicotine  (NICODERM CQ  - DOSED IN MG/24 HOURS) 14 mg/24hr patch Place 1 patch (14 mg total) onto the skin daily.      pantoprazole  (PROTONIX ) 20 MG tablet Take 1 tablet (20 mg total) by mouth daily. 30 tablet 1    propranolol  (INDERAL ) 10 MG  tablet Take 10 mg by mouth 4 (four) times daily as needed (anxiety or fast heart rate).      sildenafil (REVATIO) 20 MG tablet Take 20-60 mg by mouth daily as needed (ED).      thiamine  (VITAMIN B1) 100 MG tablet Take 1 tablet (100 mg total) by mouth daily.       Patient Stressors: Other: pt stated life    Patient Strengths: Ability for insight  Education administrator  Motivation for treatment/growth  Religious Affiliation  Supportive family/friends   Treatment Modalities: Medication Management, Group therapy, Case management,  1 to 1 session with clinician, Psychoeducation, Recreational therapy.   Physician Treatment Plan for Primary Diagnosis: Alcohol use disorder, moderate, dependence (HCC) Long Term Goal(s): Improvement in symptoms so as ready for discharge   Short Term Goals: Ability to identify and develop effective coping behaviors will improve Ability to maintain clinical measurements within normal limits will improve Compliance with prescribed medications will improve Ability to identify triggers associated with substance abuse/mental health issues will improve Ability to identify changes in lifestyle to reduce recurrence of condition will improve Ability to verbalize feelings will improve Ability to disclose and discuss suicidal ideas Ability to demonstrate self-control will improve  Medication Management: Evaluate patient's response, side effects, and tolerance of medication regimen.  Therapeutic Interventions: 1 to 1 sessions, Unit Group sessions and Medication administration.  Evaluation of Outcomes: Not Progressing  Physician Treatment Plan for Secondary Diagnosis: Principal Problem:   Alcohol use disorder, moderate, dependence (HCC) Active Problems:   MDD (major depressive disorder), recurrent episode, severe (HCC)  Long Term Goal(s): Improvement in symptoms so as ready for discharge   Short Term Goals: Ability to identify and develop  effective coping behaviors will improve Ability to maintain clinical measurements within normal limits will improve Compliance with prescribed medications will improve Ability to identify triggers associated with substance abuse/mental health issues will improve Ability to identify changes in lifestyle to reduce recurrence of condition will improve Ability to verbalize feelings will improve Ability to disclose and discuss suicidal ideas Ability to demonstrate self-control will improve     Medication Management: Evaluate patient's response, side effects, and tolerance of medication regimen.  Therapeutic Interventions: 1 to 1 sessions, Unit Group sessions and Medication administration.  Evaluation of Outcomes: Not Progressing   RN Treatment Plan for Primary Diagnosis: Alcohol use disorder, moderate, dependence (HCC) Long Term Goal(s): Knowledge  of disease and therapeutic regimen to maintain health will improve  Short Term Goals: Ability to remain free from injury will improve, Ability to verbalize frustration and anger appropriately will improve, Ability to demonstrate self-control, Ability to participate in decision making will improve, Ability to verbalize feelings will improve, Ability to disclose and discuss suicidal ideas, Ability to identify and develop effective coping behaviors will improve, and Compliance with prescribed medications will improve  Medication Management: RN will administer medications as ordered by provider, will assess and evaluate patient's response and provide education to patient for prescribed medication. RN will report any adverse and/or side effects to prescribing provider.  Therapeutic Interventions: 1 on 1 counseling sessions, Psychoeducation, Medication administration, Evaluate responses to treatment, Monitor vital signs and CBGs as ordered, Perform/monitor CIWA, COWS, AIMS and Fall Risk screenings as ordered, Perform wound care treatments as  ordered.  Evaluation of Outcomes: Not Progressing   LCSW Treatment Plan for Primary Diagnosis: Alcohol use disorder, moderate, dependence (HCC) Long Term Goal(s): Safe transition to appropriate next level of care at discharge, Engage patient in therapeutic group addressing interpersonal concerns.  Short Term Goals: Engage patient in aftercare planning with referrals and resources, Increase social support, Increase ability to appropriately verbalize feelings, Increase emotional regulation, Facilitate acceptance of mental health diagnosis and concerns, Facilitate patient progression through stages of change regarding substance use diagnoses and concerns, Identify triggers associated with mental health/substance abuse issues, and Increase skills for wellness and recovery  Therapeutic Interventions: Assess for all discharge needs, 1 to 1 time with Social worker, Explore available resources and support systems, Assess for adequacy in community support network, Educate family and significant other(s) on suicide prevention, Complete Psychosocial Assessment, Interpersonal group therapy.  Evaluation of Outcomes: Not Progressing   Progress in Treatment: Attending groups: Yes. Participating in groups: Yes. Taking medication as prescribed: Yes. Toleration medication: Yes. Family/Significant other contact made: No, will contact:  consents pending Patient understands diagnosis: Yes. Discussing patient identified problems/goals with staff: Yes. Medical problems stabilized or resolved: Yes. Denies suicidal/homicidal ideation: Yes. Issues/concerns per patient self-inventory: No.   New problem(s) identified: No, Describe:  none  New Short Term/Long Term Goal(s): detox, medication stabilization, elimination of SI thoughts, development of comprehensive mental wellness plan and sobriety plan.    Patient Goals:  I need a psychiatrist and lower my depression  Discharge Plan or Barriers: Patient  recently admitted. CSW will continue to follow and assess for appropriate referrals and possible discharge planning.    Reason for Continuation of Hospitalization: Depression Medication stabilization Suicidal ideation Withdrawal symptoms  Estimated Length of Stay: 5-7 days  Last 3 [place] Suicide Severity Risk Score: Flowsheet Row [facility] (Current[facility] Hosp-Admission (Discharged) from [facility]17/2024 in [facility] from 05/16/2024 in [facility]  C-SSRS RISK CATEGORY Error: Q3, 4, or 5 should not be populated when Q2 is No High Risk Low Risk    Last PHQ 2/9 Scores:    07/26/2023   11:32 AM  Depression screen PHQ 2/9  Decreased Interest 0  Down, Depressed, Hopeless 0  PHQ - 2 Score 0    Scribe for Treatment Team: Jenkins LULLA Primer, LCSWA 05/22/2024 1:09 PM

## 2024-05-22 NOTE — BHH Counselor (Signed)
 Adult Comprehensive Assessment  Patient ID: Russell Hansen, male   DOB: 07/07/1981, 43 y.o.   MRN: [id number]  Information Source: Information source: Patient  Current Stressors:  Patient states their primary concerns and needs for treatment are:: It was just bad, my wife got scared thats all. I know I don't want to drink anymore Patient states their goals for this hospitilization and ongoing recovery are:: I let my wife know where my alcohol stash is and asked her to throw it away. I needed to learn new ways of communicating with her and being open with her when I need things Educational / Learning stressors: None reported Employment / Job issues: None reported Family Relationships: I want to make sure I am the best I can be for my wife, I have two girls that are worried about me and I need to show them the kind of man they should be looking for Financial / Lack of resources (include bankruptcy): None reported Housing / Lack of housing: None reported Physical health (include injuries & life threatening diseases): None reported Social relationships: None reported Substance abuse: I have been struggling with drinking alcohol Bereavement / Loss: None reported  Living/Environment/Situation:  Living Arrangements: Spouse/significant other Living conditions (as described by patient or guardian): House Who else lives in the home?: Children and wife How long has patient lived in current situation?: Awhile What is atmosphere in current home: Loving, Supportive  Family History:  Marital status: Married Number of Years Married: 3 What types of issues is patient dealing with in the relationship?: Arguments with wife Additional relationship information: Alcohol use has caused additional stress Are you sexually active?: Yes What is your sexual orientation?: Heterosexual Has your sexual activity been affected by drugs, alcohol, medication, or emotional stress?: No Does patient have  children?: Yes How many children?: 3 How is patient's relationship with their children?: It's good, I know my oldest is worried about me 3 daughters, ages 13, 69 and 10  Childhood History:  Description of patient's relationship with caregiver when they were a child: UTA Patient's description of current relationship with people who raised him/her: UTA How were you disciplined when you got in trouble as a child/adolescent?: UTA Does patient have siblings?: Yes Number of Siblings: 1 Description of patient's current relationship with siblings: I have a good relationship with my brother, he stays close to me Did patient suffer any verbal/emotional/physical/sexual abuse as a child?: No Did patient suffer from severe childhood neglect?: No Has patient ever been sexually abused/assaulted/raped as an adolescent or adult?: No Was the patient ever a victim of a crime or a disaster?: No Witnessed domestic violence?: No Has patient been affected by domestic violence as an adult?: No  Education:  Highest grade of school patient has completed: Masters in education in IT trainer Currently a Consulting civil engineer?: No Learning disability?: No  Employment/Work Situation:   Employment Situation: Employed 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?: No  Financial Resources:   Surveyor, quantity resources: Media planner, Income from employment Does patient have a Lawyer or guardian?: No  Alcohol/Substance Abuse:  What has been your use of drugs/alcohol within the last 12  months?: Alcohol If attempted suicide, did drugs/alcohol play a role in this?: No Alcohol/Substance Abuse Treatment Hx: Attends AA/NA, Past Tx, Inpatient If yes, describe treatment: [facility] in [place] 3/15-4/9 of 2025 Has alcohol/substance abuse ever caused legal problems?: Yes (DUI in 2003)  Social Support System:   Patient's Community Support System: Good Describe Community Support System: Family Type of faith/religion: Christian How does patient's faith help to cope with current illness?: Prayer  Leisure/Recreation:   Do You Have Hobbies?: Yes Leisure and Hobbies: Spending time with my daughters and family  Strengths/Needs:   What is the patient's perception of their strengths?: I finally realize that I do need to be here and get the help that is necessary to be better Patient states they can use these personal strengths during their treatment to contribute to their recovery: NA Patient states these barriers may affect/interfere with their treatment: None reported Patient states these barriers may affect their return to the community: None reported  Discharge Plan:   Currently receiving community mental health services: No Patient states concerns and preferences for aftercare planning are: No providers, interested in therapy or medication management Patient states they will know when they are safe and ready for discharge when: I want to discharge but I know I need an evaluation Does patient have access to transportation?: Yes Does patient have financial barriers related to discharge medications?: No Patient description of barriers related to discharge medications: None reported Will patient be returning to same living situation after discharge?: Yes  Summary/Recommendations:   Summary and Recommendations (to be completed by the evaluator): Russell Hansen is a 43yo male who is involuntarily admitted to [facility] to [facility] from [facility] due to SI with a plan to shoot  himself with a gun. [employer] was called to the home after patient went into his room with a firearm and told his family to leave. Stressors include arguments with wife and alcohol use. 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 Lives at home with his wife and 3 daughters who he has a positive relationship with. Endorses alcohol use, reports he talked with his wife today and told her where his stash is and asked her to throw it out. Denies other substance use (UDS +Bup and marijuana). Is not established with a therapist or psychiatrist but is requesting appointments for both at discharge. Recently discharged from [facility] in April, not interested in inpatient rehab at discharge. Reports having a sponsor and attending AA meetings. Plan is for pt to return home at discharge. While here, [place] can benefit from crisis stabilization, medication management, therapeutic milieu, and referrals for services.   Jenkins LULLA Primer. 05/22/2024

## 2024-05-22 NOTE — Plan of Care (Signed)
  Problem: Education: Goal: Knowledge of [facility] [facility] Education information/materials will improve Outcome: Progressing Goal: Verbalization of understanding the information provided will improve Outcome: Progressing   Problem: Activity: Goal: Interest or engagement in activities will improve Outcome: Progressing   Problem: Health Behavior/Discharge Planning: Goal: Compliance with treatment plan for underlying cause of condition will improve Outcome: Progressing   Problem: Safety: Goal: Periods of time without injury will increase Outcome: Progressing   

## 2024-05-22 NOTE — Group Note (Signed)
 Date:  05/22/2024 Time:  9:10 AM  Group Topic/Focus:  Goals Group:   The focus of this group is to help patients establish daily goals to achieve during treatment and discuss how the patient can incorporate goal setting into their daily lives to aide in recovery.    Participation Level:  Active  Participation Quality:  Appropriate  Affect:  Appropriate  Cognitive:  Appropriate  Insight: Appropriate  Engagement in Group:  Engaged  Modes of Intervention:  Discussion  Additional Comments:  Pt behavior was appropriate and actively engaged during group.  Kimiya Brunelle A Kannon Granderson 05/22/2024, 9:10 AM

## 2024-05-22 NOTE — Progress Notes (Signed)
 Russell Hansen requested to speak to discuss how to gain back the trust of his children and his wife.  I provided listening and affirmed the ways in which he is already working to build back trust.  We discussed the ways that he is modeling resillience to his kids by acknowledging mistakes and choosing to get help and support. He expressed appreciation for being able to talk.

## 2024-05-22 NOTE — Progress Notes (Addendum)
 Patient presents: Anxious but overall cooperative and med compliant. Patient states I made a mistake but I'm just trying to get back to my family you know. Patient observed out in milieu and socializing appropriately.    SI/HI/AVH: Denies   Plan: Denies   Groups attended: 3/3   Appetite: adequate. Attended meals.   Sleep: No reported sleep disturbances   PRNS: Propanolol po prn @1730    Disturbances: No disturbances. Patient has remained cooperative in milieu.    Questions/concerns: Patient had questions regarding his discharge as he believes he does not need to be here. Discharge process explained to patient. Patient verbalized all understanding.   VS: BP (!) 128/95 (BP Location: Left Arm)   Pulse 72   Temp 97.6 F (36.4 C) (Oral)   Resp 18   Ht 5' 8 (1.727 m)   Wt 64.3 kg   SpO2 100%   BMI 21.56 kg/m
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
## 2024-05-22 NOTE — Progress Notes (Addendum)
(  Sleep Hours) -5.25 (Any PRNs that were needed, meds refused, or side effects to meds)- propranolol  (Any disturbances and when (visitation, over night)-none (Concerns raised by the patient)- none (SI/HI/AVH)-denied

## 2024-05-22 NOTE — BHH Suicide Risk Assessment (Signed)
 Suicide Risk Assessment  Admission Assessment    [facility] Admission Suicide Risk Assessment   Nursing information obtained from:  Patient  Demographic factors:  Male  Current Mental Status:  NA  Loss Factors:  NA  Historical Factors:  NA  Risk Reduction Factors:  Positive social support, Living with another person, especially a relative  Total Time spent with patient: 1.5 hours  Principal Problem: Alcohol use disorder, moderate, dependence (HCC)  Diagnosis:  Principal Problem:   Alcohol use disorder, moderate, dependence (HCC) Active Problems:   MDD (major depressive disorder), recurrent episode, severe (HCC)  Subjective Data: See H&P.  Continued Clinical Symptoms:  Alcohol Use Disorder Identification Test Final Score (AUDIT): 2 The Alcohol Use Disorders Identification Test, Guidelines for Use in Primary Care, Second Edition.  World Science writer [facility]). Score between 0-7:  no or low risk or alcohol related problems. Score between 8-15:  moderate risk of alcohol related problems. Score between 16-19:  high risk of alcohol related problems. Score 20 or above:  warrants further diagnostic evaluation for alcohol dependence and treatment.  CLINICAL FACTORS:   Severe Anxiety and/or Agitation Depression:   Comorbid alcohol abuse/dependence Impulsivity Alcohol/Substance Abuse/Dependencies More than one psychiatric diagnosis Unstable or Poor Therapeutic Relationship Previous Psychiatric Diagnoses and Treatments  Musculoskeletal: Strength & Muscle Tone: within normal limits Gait & Station: normal Patient leans: N/A  Psychiatric Specialty Exam:  Presentation  General Appearance:  Casual (Right hand wrapped in Ace bandage with last right fingers splinted.)  Eye Contact: Good  Speech: Clear and Coherent; Normal Rate  Speech Volume: Normal  Handedness: Right   Mood and Affect  Mood: Anxious; Depressed  Affect: Congruent  Thought Process  Thought  Processes: Coherent  Descriptions of Associations:Intact  Orientation:Full (Time, Place and Person)  Thought Content:Logical  History of Schizophrenia/Schizoaffective disorder:No  Duration of Psychotic Symptoms:No data recorded Hallucinations:Hallucinations: None Description of Visual Hallucinations: NA  Ideas of Reference:None  Suicidal Thoughts:Suicidal Thoughts: No  Homicidal Thoughts:Homicidal Thoughts: No   Sensorium  Memory: Immediate Good; Recent Good; Remote Good  Judgment: Fair  Insight: Fair   Art therapist  Concentration: Fair  Attention Span: Fair  Recall: Good  Fund of Knowledge: Fair  Language: Good   Psychomotor Activity  Psychomotor Activity:Psychomotor Activity: Normal   Assets  Assets: Communication Skills; Desire for Improvement; Financial Resources/Insurance; Housing; Resilience; Social Support  Sleep  Sleep:Sleep: Good Number of Hours of Sleep: 8  Physical Exam: See H&P.   Blood pressure (!) 116/97, pulse 80, temperature 97.6 F (36.4 C), temperature source Oral, resp. rate 16, height 5' 8 (1.727 m), weight 64.3 kg, SpO2 100%. Body mass index is 21.56 kg/m.  COGNITIVE FEATURES THAT CONTRIBUTE TO RISK:  Closed-mindedness, Polarized thinking, and Thought constriction (tunnel vision)    SUICIDE RISK:   Moderate:  Frequent suicidal ideation with limited intensity, and duration, some specificity in terms of plans, no associated intent, good self-control, limited dysphoria/symptomatology, some risk factors present, and identifiable protective factors, including available and accessible social support.  PLAN OF CARE: See H&P.  I certify that inpatient services furnished can reasonably be expected to improve the patient's condition.   Mac Bolster, NP, pmhnp, fnp-bc. 05/22/2024, 11:46 AM

## 2024-05-22 NOTE — Progress Notes (Incomplete)
 43 year old African-American male, married, lives with his family.  Patient was medically admitted on account of alcohol withdrawal syndrome.  He expressed depression with suicidal thoughts during his medical hospitalization.  He was started on escitalopram  and propranolol  on as needed basis.  He was involuntarily committed so as to get care regarding his mental illness.  AST and ALT are mildly elevated.  Patient has a fracture of the 4th and 5th metatarsal likely sustained while intoxicated with alcohol.  His UDS was also positive for THC and buprenorphine. Patient was in rehab in March 2025.  He is linked to AA.  He has a sponsor.  He is minimizing his alcohol use.  No desire for addiction treatment at this time.  We will maintain his current regimen, get collateral from his family and evaluate him further.  I will allow voluntary status as there is no dangerousness.

## 2024-05-22 NOTE — Plan of Care (Signed)

## 2024-05-22 NOTE — Progress Notes (Signed)
 Adult Psychoeducational Group Note  Date:  05/22/2024 Time:  8:46 PM  Group Topic/Focus:  Wrap-Up Group:   The focus of this group is to help patients review their daily goal of treatment and discuss progress on daily workbooks.  Participation Level:  Active  Participation Quality:  Appropriate  Affect:  Appropriate  Cognitive:  Appropriate  Insight: Appropriate  Engagement in Group:  Engaged  Modes of Intervention:  Discussion  Additional Comments:  Mister said the positive thing he talk to his mother and had a good visit  Lang Donia Law 05/22/2024, 8:46 PM

## 2024-05-22 NOTE — BHH Group Notes (Signed)
 Spiritual care group facilitated by Chaplain Rockie Sofia, [facility]  Group focused on topic of strength. Group members reflected on what thoughts and feelings emerge when they hear this topic. They then engaged in facilitated dialog around how strength is present in their lives. This dialog focused on representing what strength had been to them in their lives (images and patterns given) and what they saw as helpful in their life now (what they needed / wanted).  Activity drew on narrative framework.  Patient Progress: Russell Hansen attended group and actively engaged and participated in group conversation and activities.  Comments demonstrated good insight and contributed positively to the group conversation.

## 2024-05-22 NOTE — Tx Team (Signed)
 Initial Treatment Plan 05/22/2024 12:32 AM Russell Hansen FMW:[id number]    PATIENT STRESSORS: Other: pt stated life     PATIENT STRENGTHS: Ability for insight  Education administrator  Motivation for treatment/growth  Religious Affiliation  Supportive family/friends    PATIENT IDENTIFIED PROBLEMS:  Stated life                     DISCHARGE CRITERIA:  Improved stabilization in mood, thinking, and/or behavior Motivation to continue treatment in a less acute level of care Verbal commitment to aftercare and medication compliance  PRELIMINARY DISCHARGE PLAN: Attend 12-step recovery group Outpatient therapy Return to previous living arrangement  PATIENT/FAMILY INVOLVEMENT: This treatment plan has been presented to and reviewed with the patient, Russell Hansen.  The patient and family have been given the opportunity to ask questions and make suggestions.  Revonda DELENA Land, RN 05/22/2024, 12:32 AM

## 2024-05-22 NOTE — Progress Notes (Signed)
 Admission Note:Patient is a IVC 43 year old male . Pt  was initially upset upon arrival, pt stated the previous hospital told him his admission would be voluntary and he would only be admitted for 72 hours. Pt was refusing to come out of the pt intake arrival.After encouragement from [facility] and [employer] pt agreed to come into pt search room. Pt denied  SI/HI/AVH.Pt stated he does not drink everyday,and when he does drink he only drinks two shots of vodka. Admission plan of care reviewed with pt, consent signed. Personal belongings/skin assessment completed. Pt has brusing to left antecubital region and right knee. Pt has a broken 4th and 5th digit on his right hand. When asked what happen pt stated he did something dumb. Pt minor toe on his right foot is broken No contraband found.  Patient oriented to the unit, staff and room.  Routine safety checks initiated.  Pt verbalized understanding of unit rules/protocols.Pt is presently safe on the unit. No unsafe behaviors noted.  Q 15 minute safety checks maintained per unit protocol.

## 2024-05-23 ENCOUNTER — Ambulatory Visit: Admission: RE | Admit: 2024-05-23 | Source: Home / Self Care | Admitting: Gastroenterology

## 2024-05-23 DIAGNOSIS — Z8659 Personal history of other mental and behavioral disorders: Secondary | ICD-10-CM

## 2024-05-23 DIAGNOSIS — F1020 Alcohol dependence, uncomplicated: Secondary | ICD-10-CM

## 2024-05-23 SURGERY — EGD (ESOPHAGOGASTRODUODENOSCOPY)
Anesthesia: General

## 2024-05-23 MED ORDER — IBUPROFEN 800 MG PO TABS
800.0000 mg | ORAL_TABLET | Freq: Once | ORAL | Status: AC
  Administered 2024-05-23: 800 mg via ORAL
  Filled 2024-05-23: qty 1

## 2024-05-23 MED ORDER — DICLOFENAC SODIUM 1 % EX GEL: 2.0000 g | Freq: Two times a day (BID) | CUTANEOUS | Status: DC | PRN

## 2024-05-23 MED ORDER — AMLODIPINE BESYLATE 5 MG PO TABS
7.5000 mg | ORAL_TABLET | Freq: Every day | ORAL | Status: DC
  Administered 2024-05-24: 7.5 mg via ORAL
  Filled 2024-05-23: qty 2

## 2024-05-23 MED ORDER — AMLODIPINE BESYLATE 5 MG PO TABS
2.5000 mg | ORAL_TABLET | Freq: Once | ORAL | Status: AC
  Administered 2024-05-23: 2.5 mg via ORAL
  Filled 2024-05-23: qty 1
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
## 2024-05-23 NOTE — Progress Notes (Signed)
 Nursing 1:1 note D:Pt is lying in bed .Respirations are unlabored and chest observed for rise and fall. Pt is calm and cooperative.Tech remains at bedside.  A: 1:1 observation continues for safety due to ace wrap located on his right hand.  R: pt remains safe

## 2024-05-23 NOTE — [facility] Suicide Risk Assessment (Signed)
 [facility] INPATIENT:  Family/Significant Other Suicide Prevention Education  Suicide Prevention Education:  Education Completed; Wife, Russell Hansen [phone],  (name of family member/significant other) has been identified by the patient as the family member/significant other with whom the patient will be residing, and identified as the person(s) who will aid the patient in the event of a mental health crisis (suicidal ideations/suicide attempt).  With written consent from the patient, the family member/significant other has been provided the following suicide prevention education, prior to the and/or following the discharge of the patient.  Wife reports pt has been more rational since admission and seems to have better insight into his mental health. No safety concerns with pt returning home at discharge. Wife will plan to pick pt up if it is anytime tomorrow 8/1 after 430 or anytime on Saturday 8/2.  Firearm has been removed from the home and it will never be coming back.   The suicide prevention education provided includes the following: Suicide risk factors Suicide prevention and interventions National Suicide Hotline telephone number [facility] assessment telephone number [facility] Emergency Assistance 911 [facility] and/or Residential Mobile Crisis Unit telephone number  Request made of family/significant other to: Remove weapons (e.g., guns, rifles, knives), all items previously/currently identified as safety concern.   Remove drugs/medications (over-the-counter, prescriptions, illicit drugs), all items previously/currently identified as a safety concern.  The family member/significant other verbalizes understanding of the suicide prevention education information provided.  The family member/significant other agrees to remove the items of safety concern listed above.  Russell Hansen 05/23/2024, 3:25 PM

## 2024-05-23 NOTE — Progress Notes (Signed)
 1:1 Progress Note   Patient is observed in his bedroom. Sitter within eye sight. Patient is calm and cooperative.    Sitter remains for safety. 15 minute checks continued for safety.    Patient safe on the unit.

## 2024-05-23 NOTE — Group Note (Signed)
 Date:  05/23/2024 Time:  10:02 PM  Group Topic/Focus:  Wrap-Up Group:   The focus of this group is to help patients review their daily goal of treatment and discuss progress on daily workbooks.    Participation Level:  Active  Participation Quality:  Appropriate  Affect:  Appropriate  Cognitive:  Appropriate  Insight: Appropriate  Engagement in Group:  Engaged  Modes of Intervention:  Education and Exploration  Additional Comments: Patient attended and participated in group tonight. He reports that his goal today was to go home.  He did not meet his goal, however he spoke with his doctor.  Gwenn Chillington Dacosta 05/23/2024, 10:02 PM

## 2024-05-23 NOTE — Group Note (Signed)
 Date:  05/23/2024 Time:  9:49 AM  Group Topic/Focus:  Coping With Mental Health Crisis:   The purpose of this group is to help patients identify strategies for coping with mental health crisis.  Group discusses possible causes of crisis and ways to manage them effectively.    Participation Level:  Active  Participation Quality:  Appropriate  Affect:  Appropriate  Cognitive:  Appropriate  Insight: Appropriate  Engagement in Group:  Engaged  Modes of Intervention:  Discussion  Additional Comments:    Russell Hansen 05/23/2024, 9:49 AM

## 2024-05-23 NOTE — Progress Notes (Signed)
  Nursing 1:1 note D:Pt is lying in bed .Respirations are unlabored and chest observed for rise and fall. Pt is calm and cooperative.Tech remains at bedside.  A: 1:1 observation continues for safety due to ace wrap located on his right hand.  R: pt remains safe

## 2024-05-23 NOTE — Group Note (Signed)
 LCSW Group Therapy Note   Group Date: 05/23/2024 Start Time: 1100 End Time: 1200   Participation:  patient was present   Type of Therapy:  Group Therapy  Topic:  Shining from Within:  Confidence and Self-Love Journey  Objective:  To support participants in developing confidence and self-love through self-awareness, self-compassion, and practical skills that nurture personal growth.   Group Goals Encourage self-reflection and self-acceptance by identifying personal strengths and achievements. Teach skills to challenge negative self-talk and replace it with supportive, truthful self-talk. Foster resilience and self-worth through [employer], gratitude, and self-care practices.   Summary:  This group explores the connection between confidence and self-love by guiding participants through reflection, mindset shifts, and practical tools like affirmations, strength recognition, and goal-setting. Activities are designed to promote self-compassion, build emotional resilience, and normalize the slow, patient journey of inner growth.   Therapeutic Modalities Used Cognitive Behavioral Therapy (CBT): Challenging and reframing unhelpful self-talk. Motivational Interviewing (MI): Encouraging small, achievable goals. Elements of Dialectical Behavioral Therapist (DBT):  Mindfulness and Self-Compassion: Promoting present-moment awareness and kindness toward self.   Russell Hansen Russell Hansen, LCSWA 05/23/2024  5:04 PM

## 2024-05-23 NOTE — Progress Notes (Signed)
 1:1 Progress Note   Patient is observed in the hallway sitter within line of sight. Patient is calm and cooperative.    Sitter remains for safety. 15 minute checks continued for safety.    Patient safe on the unit.

## 2024-05-23 NOTE — Progress Notes (Signed)
   05/23/24 0100  Neurological  Neuro (WDL) WDL  HEENT  HEENT (WDL) X  R Eye Eyeglasses  L Eye Eyeglasses  Respiratory  Respiratory (WDL) WDL  Cardiac  Cardiac (WDL) X (htn)  Vascular  Vascular (WDL) X  Edema Right upper extremity  Integumentary  Integumentary (WDL) X  Braden Scale (Ages 8 and up)  Sensory Perceptions 4  Moisture 4  Activity 4  Mobility 4  Nutrition 3  Friction and Shear 3  Braden Scale Score 22  Musculoskeletal  Musculoskeletal (WDL) X  Musculoskeletal Details  RUE Ortho/Supportive Device  RUE Ortho/Supportive Device Ace wrap;Splint  Right Fingers Injury/trauma;Swelling;Limited movement  Right Toes Limited movement;Injury/trauma  Gastrointestinal  Gastrointestinal (WDL) WDL  Last BM Date  05/22/24  GU Assessment  Genitourinary (WDL) WDL   (Sleep Hours) -2.24 (Any PRNs that were needed, meds refused, or side effects to meds)- none (Any disturbances and when (visitation, over night)-none (Concerns raised by the patient)- Pt request additional pain medication. A one time dose of ibuprofen  800 mg  was ordered and administered  (SI/HI/AVH)-Denied

## 2024-05-23 NOTE — Progress Notes (Signed)
 Voluntary consent signed and filed in pt chart.

## 2024-05-23 NOTE — Progress Notes (Addendum)
 D: Patient is alert, oriented, pleasant, and cooperative. Denies SI, HI, AVH, and verbally contracts for safety. Patient reports he slept good last night without sleeping medication. Patient reports his appetite as good, energy level as high, and concentration as good. Patient rates his depression 5/10, hopelessness 2/10, and anxiety 4/10. Patient denies physical symptoms/pain.    1:1 remains for safety.   A: Scheduled medications administered per MD order. Support provided. Patient educated on safety on the unit and medications. Routine safety checks every 15 minutes. Patient stated understanding to tell nurse about any new physical symptoms. Patient understands to tell staff of any needs.     R: No adverse drug reactions noted. Patient remains safe at this time and will continue to monitor.    05/23/24 0800  Psych Admission Type (Psych Patients Only)  Admission Status Involuntary  Psychosocial Assessment  Patient Complaints None  Eye Contact Fair  Facial Expression Animated  Affect Appropriate to circumstance  Speech Logical/coherent  Interaction Assertive  Motor Activity Other (Comment) (WNL)  Appearance/Hygiene Unremarkable  Behavior Characteristics Cooperative  Mood Pleasant  Thought Process  Coherency WDL  Content WDL  Delusions None reported or observed  Perception WDL  Hallucination None reported or observed  Judgment Impaired  Confusion None  Danger to Self  Current suicidal ideation? Denies  Agreement Not to Harm Self Yes  Description of Agreement verbal  Danger to Others  Danger to Others None reported or observed

## 2024-05-23 NOTE — Plan of Care (Signed)

## 2024-05-23 NOTE — Progress Notes (Addendum)
 Nursing 1:1 note D:Pt observed ambulating in the hallway with tech. Gait is steady and respirations are unlabored. Pt is calm and cooperative.  A: 1:1 observation continues for safety due to ace wrap located on his right hand.  R: pt remains safe

## 2024-05-23 NOTE — Plan of Care (Signed)

## 2024-05-24 DIAGNOSIS — F332 Major depressive disorder, recurrent severe without psychotic features: Principal | ICD-10-CM

## 2024-05-24 DIAGNOSIS — F1290 Cannabis use, unspecified, uncomplicated: Secondary | ICD-10-CM | POA: Insufficient documentation

## 2024-05-24 LAB — LIPID PANEL
Cholesterol: 159 mg/dL (ref 0–200)
HDL: 85 mg/dL (ref >40)
LDL Cholesterol: 63 mg/dL (ref 0–99)
Total CHOL/HDL Ratio: 1.9 ratio
Triglycerides: 53 mg/dL (ref <150)
VLDL: 11 mg/dL (ref 0–40)

## 2024-05-24 MED ORDER — ESCITALOPRAM OXALATE 5 MG PO TABS: 5.0000 mg | ORAL_TABLET | Freq: Every day | ORAL | 0 refills | Status: AC

## 2024-05-24 MED ORDER — FOLIC ACID 1 MG PO TABS: 1.0000 mg | ORAL_TABLET | Freq: Every day | ORAL | 0 refills | Status: AC

## 2024-05-24 MED ORDER — PROPRANOLOL HCL 10 MG PO TABS: 10.0000 mg | ORAL_TABLET | Freq: Four times a day (QID) | ORAL | 0 refills | Status: AC | PRN

## 2024-05-24 MED ORDER — PANTOPRAZOLE SODIUM 20 MG PO TBEC: 20.0000 mg | DELAYED_RELEASE_TABLET | Freq: Every day | ORAL | 0 refills | Status: AC

## 2024-05-24 MED ORDER — VITAMIN B-12 1000 MCG PO TABS: 1000.0000 ug | ORAL_TABLET | Freq: Every day | ORAL | 0 refills | Status: AC

## 2024-05-24 MED ORDER — ADULT MULTIVITAMIN W/MINERALS CH: 1.0000 | ORAL_TABLET | Freq: Every day | ORAL | Status: AC

## 2024-05-24 MED ORDER — DICLOFENAC SODIUM 1 % EX GEL: 2.0000 g | Freq: Two times a day (BID) | CUTANEOUS | Status: AC | PRN

## 2024-05-24 MED ORDER — THIAMINE HCL 100 MG PO TABS: 100.0000 mg | ORAL_TABLET | Freq: Every day | ORAL | 0 refills | Status: AC

## 2024-05-24 MED ORDER — AMLODIPINE BESYLATE 2.5 MG PO TABS: 7.5000 mg | ORAL_TABLET | Freq: Every day | ORAL | 0 refills | Status: AC

## 2024-05-24 MED ORDER — NAPROXEN 500 MG PO TABS: 500.0000 mg | ORAL_TABLET | Freq: Two times a day (BID) | ORAL | Status: AC

## 2024-05-24 MED ORDER — GABAPENTIN 300 MG PO CAPS: 300.0000 mg | ORAL_CAPSULE | Freq: Three times a day (TID) | ORAL | 0 refills | Status: AC

## 2024-05-24 MED ORDER — NICOTINE 14 MG/24HR TD PT24: 14.0000 mg | MEDICATED_PATCH | Freq: Every day | TRANSDERMAL | Status: AC

## 2024-05-24 NOTE — Progress Notes (Signed)
  [facility] Adult Case Management Discharge Plan :  Will you be returning to the same living situation after discharge:  Yes,  pt returning home at discharge At discharge, do you have transportation home?: Yes,  pt will be picked up by wife around 5PM Do you have the ability to pay for your medications: Yes,  pt has active health insurance coverage and is employed  Release of information consent forms completed and in the chart;  Patient's signature needed at discharge.  Patient to Follow up at:  Follow-up Information     [facility] Follow up on 05/30/2024.   Why: You have a hospital follow up appointment for therapy and medication management services on 05/30/24 at 12:00 pm.  The appointment will be Virtual telehealth. Contact information: [address]  [address] [place] [address] [phone]                 Next level of care provider has access to [facility] Link:no  Safety Planning and Suicide Prevention discussed: Yes,  Wife, [name] [phone]     Has patient been referred to the Quitline?: Patient refused referral for treatment  Patient has been referred for addiction treatment: Patient refused referral for treatment. Pt reports he already attends AA and has a sponsor. No additional treatment desired.  Jenkins LULLA Primer, LCSWA 05/24/2024, 10:42 AM

## 2024-05-24 NOTE — Progress Notes (Signed)
 1:1 Progress Note   Patient is observed in the hallway calm and cooperative. Sitter within eye sight.    Sitter remains for safety. 15 minute checks continued for safety.    Patient safe on the unit.

## 2024-05-24 NOTE — [facility] Suicide Risk Assessment (Signed)
 [facility] Discharge Suicide Risk Assessment   Principal Problem: Severe episode of recurrent major depressive disorder, without psychotic features (HCC) Discharge Diagnoses: Principal Problem:   Severe episode of recurrent major depressive disorder, without psychotic features (HCC) Active Problems:   MDD (major depressive disorder), recurrent episode, severe (HCC)   Alcohol use disorder, moderate, dependence (HCC)   Cannabis use disorder   Total Time spent with patient: 45 minutes  Musculoskeletal: Strength & Muscle Tone: within normal limits Gait & Station: normal Patient leans: N/A  Psychiatric Specialty Exam  Presentation  General Appearance:  Casually dressed, not in any distress, appropriate behavior, engaged politely.  No EPS.  Eye Contact: Good.  Speech: Spontaneous.  Normal rate, tone and volume.  Normal prosody of speech.  Mood and Affect  Mood: Euthymic.  Affect: Full range and appropriate.  Thought Process  Thought Processes: Linear and goal directed.  Descriptions of Associations:Intact  Orientation:Full (Time, Place and Person)  Thought Content: Future oriented.  No current suicidal thoughts.  No homicidal thoughts.  No thoughts of violence.  No negative ruminative flooding.  No guilty ruminations.  No delusional theme.  No obsessions.  Hallucinations: No hallucination in any modality.  Sensorium  Memory: Good.  Judgment: Good.  Insight: Good  Executive Functions  Concentration: Good.  Attention Span: Good.  Recall: Good.  Fund of Knowledge: Good.  Language: Good   Psychomotor Activity  Normal psychomotor activity   Physical Exam: Physical Exam ROS Blood pressure (!) 129/95, pulse 75, temperature 97.8 F (36.6 C), temperature source Oral, resp. rate 16, height 5' 8 (1.727 m), weight 64.3 kg, SpO2 100%. Body mass index is 21.56 kg/m.  Mental Status Per Nursing Assessment::   On Admission:  NA  Demographic Factors:   Male  Loss Factors: NA  Historical Factors: Family history of mental illness or substance abuse  Risk Reduction Factors:   Sense of responsibility to family, Living with another person, especially a relative, Positive social support, Positive therapeutic relationship, and Positive coping skills or problem solving skills  Continued Clinical Symptoms:  No overwhelming anxiety.  No evidence of depression.  No evidence of mania.  No evidence of psychosis.  Cognitive Features That Contribute To Risk:  None    Suicide Risk:  Minimal: No current suicidal thoughts.  No current homicidal thoughts.  No current thoughts of violence.  Patient is on an antidepressant.  He has completely detoxed from alcohol.  No current cravings.  No new psychosocial stressor.  Family remains very supportive. At this point in time, patient is not a danger to self or to others.  Patient is stable for care at the lower setting.  Follow-up Information     [facility]. Schedule an appointment as soon as possible for a visit.   Why: You have a hospital follow up appointment for therapy and medication management services on  .  The appointment will be Virtual telehealth. Contact information: [address]  [address] [place] [address] [phone]                 Plan Of Care/Follow-up recommendations:  See discharge summary.  Jerrell DELENA Forehand, MD 05/24/2024, 10:06 AM

## 2024-05-24 NOTE — Group Note (Signed)
 Recreation Therapy Group Note   Group Topic:Problem Solving  Group Date: 05/24/2024 Start Time: 0930 End Time: 1005 Facilitators: Mosella Kasa-McCall, LRT,CTRS Location: [address] Dayroom   Group Topic: Communication, Team Building, Problem Solving  Goal Area(s) Addresses:  Patient will effectively work with peer towards shared goal.  Patient will identify skills used to make activity successful.  Patient will identify how skills used during activity can be used to reach post d/c goals.   Behavioral Response:   Intervention: STEM Activity  Activity: Straw Bridge. In teams of 3-5, patients were given 15 plastic drinking straws and an equal length of masking tape. Using the materials provided, patients were instructed to build a free standing bridge-like structure to suspend an everyday item (ex: puzzle box) off of the floor or table surface. All materials were required to be used by the team in their design. LRT facilitated post-activity discussion reviewing team process. Patients were encouraged to reflect how the skills used in this activity can be generalized to daily life post discharge.   Education: Pharmacist, community, Scientist, physiological, Discharge Planning   Education Outcome: Acknowledges education/In group clarification offered/Needs additional education.    Affect/Mood: N/A   Participation Level: Did not attend    Clinical Observations/Individualized Feedback:     Plan: Continue to engage patient in RT group sessions 2-3x/week.   Darrion Wyszynski-McCall, LRT,CTRS 05/24/2024 12:44 PM

## 2024-05-24 NOTE — Plan of Care (Signed)

## 2024-05-24 NOTE — Group Note (Signed)
 Date:  05/24/2024 Time:  9:46 AM  Group Topic/Focus:  Goals Group:   The focus of this group is to help patients establish daily goals to achieve during treatment and discuss how the patient can incorporate goal setting into their daily lives to aide in recovery. Healthy Communication:   The focus of this group is to discuss communication, barriers to communication, as well as healthy ways to communicate with others.    Participation Level:  Active  Participation Quality:  Appropriate  Affect:  Appropriate  Cognitive:  Appropriate  Insight: Appropriate  Engagement in Group:  Engaged  Modes of Intervention:  Discussion and Education  Additional Comments:  Pt was engaged throughout group.  Shyia Fillingim A Aaren Atallah 05/24/2024, 9:46 AM

## 2024-05-24 NOTE — Progress Notes (Signed)
 Patient verbalizes readiness for discharge. All patient belongings returned to patient. Discharge instructions read and discussed with patient (appointments, medications, resources). Patient expressed gratitude for care provided. Patient discharged to lobby at 1705 where his ride was waiting.

## 2024-05-24 NOTE — Progress Notes (Signed)
 Patient remains on 1:1 with for safety. He was cooperative with treatment and medications, he denies SI, HI & AVH. No w/d symptoms noted on shift thus far.
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
## 2024-05-25 LAB — HEMOGLOBIN A1C
Hgb A1c MFr Bld: 5.2 % (ref 4.8–5.6)
Mean Plasma Glucose: 103 mg/dL

## 2024-06-06 ENCOUNTER — Ambulatory Visit: Admitting: Registered Nurse

## 2024-06-06 ENCOUNTER — Encounter: Payer: Self-pay | Admitting: Gastroenterology

## 2024-06-06 ENCOUNTER — Ambulatory Visit
Admission: RE | Admit: 2024-06-06 | Discharge: 2024-06-06 | Disposition: A | Discharge status: Home / Self Care | Attending: Gastroenterology | Admitting: Gastroenterology

## 2024-06-06 ENCOUNTER — Encounter
Admission: RE | Disposition: A | Discharge status: Home / Self Care | Payer: Self-pay | Source: Home / Self Care | Attending: Gastroenterology

## 2024-06-06 DIAGNOSIS — K640 First degree hemorrhoids: Secondary | ICD-10-CM | POA: Diagnosis not present

## 2024-06-06 DIAGNOSIS — D649 Anemia, unspecified: Secondary | ICD-10-CM | POA: Insufficient documentation

## 2024-06-06 DIAGNOSIS — I10 Essential (primary) hypertension: Secondary | ICD-10-CM | POA: Insufficient documentation

## 2024-06-06 DIAGNOSIS — K219 Gastro-esophageal reflux disease without esophagitis: Secondary | ICD-10-CM | POA: Diagnosis not present

## 2024-06-06 DIAGNOSIS — K2289 Other specified disease of esophagus: Secondary | ICD-10-CM | POA: Diagnosis not present

## 2024-06-06 DIAGNOSIS — R1310 Dysphagia, unspecified: Secondary | ICD-10-CM | POA: Insufficient documentation

## 2024-06-06 DIAGNOSIS — K449 Diaphragmatic hernia without obstruction or gangrene: Secondary | ICD-10-CM | POA: Diagnosis not present

## 2024-06-06 DIAGNOSIS — Z1211 Encounter for screening for malignant neoplasm of colon: Secondary | ICD-10-CM | POA: Insufficient documentation

## 2024-06-06 DIAGNOSIS — K2970 Gastritis, unspecified, without bleeding: Secondary | ICD-10-CM | POA: Diagnosis not present

## 2024-06-06 DIAGNOSIS — M1990 Unspecified osteoarthritis, unspecified site: Secondary | ICD-10-CM | POA: Insufficient documentation

## 2024-06-06 DIAGNOSIS — F17200 Nicotine dependence, unspecified, uncomplicated: Secondary | ICD-10-CM | POA: Insufficient documentation

## 2024-06-06 DIAGNOSIS — K222 Esophageal obstruction: Secondary | ICD-10-CM | POA: Diagnosis not present

## 2024-06-06 DIAGNOSIS — Z79899 Other long term (current) drug therapy: Secondary | ICD-10-CM | POA: Diagnosis not present

## 2024-06-06 DIAGNOSIS — K2100 Gastro-esophageal reflux disease with esophagitis, without bleeding: Secondary | ICD-10-CM | POA: Diagnosis not present

## 2024-06-06 DIAGNOSIS — K759 Inflammatory liver disease, unspecified: Secondary | ICD-10-CM | POA: Diagnosis not present

## 2024-06-06 DIAGNOSIS — Z791 Long term (current) use of non-steroidal anti-inflammatories (NSAID): Secondary | ICD-10-CM | POA: Diagnosis not present

## 2024-06-06 DIAGNOSIS — J45909 Unspecified asthma, uncomplicated: Secondary | ICD-10-CM | POA: Diagnosis not present

## 2024-06-06 HISTORY — PX: ESOPHAGOGASTRODUODENOSCOPY: SHX5428

## 2024-06-06 HISTORY — PX: COLONOSCOPY: SHX5424

## 2024-06-06 SURGERY — COLONOSCOPY
Anesthesia: General

## 2024-06-06 MED ORDER — PROPOFOL 10 MG/ML IV BOLUS
INTRAVENOUS | Status: AC
  Filled 2024-06-06: qty 20

## 2024-06-06 MED ORDER — MIDAZOLAM HCL 2 MG/2ML IJ SOLN
INTRAMUSCULAR | Status: DC | PRN
  Administered 2024-06-06: 2 mg via INTRAVENOUS

## 2024-06-06 MED ORDER — SODIUM CHLORIDE 0.9 % IV SOLN: INTRAVENOUS | Status: DC

## 2024-06-06 MED ORDER — PROPOFOL 10 MG/ML IV BOLUS
INTRAVENOUS | Status: DC | PRN
  Administered 2024-06-06: 100 mg via INTRAVENOUS
  Administered 2024-06-06: 100 ug/kg/min via INTRAVENOUS
  Administered 2024-06-06: 30 mg via INTRAVENOUS
  Administered 2024-06-06: 50 mg via INTRAVENOUS
  Administered 2024-06-06: 90 mg via INTRAVENOUS

## 2024-06-06 MED ORDER — ESMOLOL HCL 100 MG/10ML IV SOLN
INTRAVENOUS | Status: DC | PRN
  Administered 2024-06-06: 10 mg via INTRAVENOUS

## 2024-06-06 MED ORDER — MIDAZOLAM HCL 2 MG/2ML IJ SOLN
INTRAMUSCULAR | Status: AC
  Filled 2024-06-06: qty 2

## 2024-06-06 MED ORDER — LIDOCAINE HCL (CARDIAC) PF 100 MG/5ML IV SOSY
PREFILLED_SYRINGE | INTRAVENOUS | Status: DC | PRN
  Administered 2024-06-06: 100 mg via INTRAVENOUS

## 2024-06-06 MED ORDER — DEXMEDETOMIDINE HCL IN NACL 80 MCG/20ML IV SOLN
INTRAVENOUS | Status: DC | PRN
Start: 2024-06-06 — End: 2024-06-06
  Administered 2024-06-06 (×3): 8 ug via INTRAVENOUS
  Administered 2024-06-06: 44 ug via INTRAVENOUS

## 2024-06-06 MED ORDER — ONDANSETRON HCL 4 MG/2ML IJ SOLN
INTRAMUSCULAR | Status: DC | PRN
  Administered 2024-06-06: 4 mg via INTRAVENOUS

## 2024-06-06 NOTE — Anesthesia Postprocedure Evaluation (Signed)
 Anesthesia Post Note  Patient: SABAN HEINLEN  Procedure(s) Performed: COLONOSCOPY EGD (ESOPHAGOGASTRODUODENOSCOPY) Balloon dilation wire-guided  Patient location during evaluation: Endoscopy Anesthesia Type: General Level of consciousness: awake and alert Pain management: pain level controlled Vital Signs Assessment: post-procedure vital signs reviewed and stable Respiratory status: spontaneous breathing, nonlabored ventilation, respiratory function stable and patient connected to nasal cannula oxygen Cardiovascular status: blood pressure returned to baseline and stable Postop Assessment: no apparent nausea or vomiting Anesthetic complications: no   No notable events documented.   Last Vitals:  Vitals:   06/06/24 1210 06/06/24 1220  BP: (!) 112/91 (!) 119/90  Pulse: 82 72  Resp: 13 13  Temp:    SpO2: 100% 100%    Last Pain:  Vitals:   06/06/24 1220  TempSrc:   PainSc: 0-No pain                 Prentice Murphy

## 2024-06-06 NOTE — Transfer of Care (Signed)
 Immediate Anesthesia Transfer of Care Note  Patient: Russell Hansen  Procedure(s) Performed: COLONOSCOPY EGD (ESOPHAGOGASTRODUODENOSCOPY)  Patient Location: Endoscopy Unit  Anesthesia Type:General  Level of Consciousness: drowsy and patient cooperative  Airway & Oxygen Therapy: Patient Spontanous Breathing  Post-op Assessment: Report given to RN and Post -op Vital signs reviewed and stable  Post vital signs: Reviewed and stable  Last Vitals:  Vitals Value Taken Time  BP 83/65 06/06/24 12:01  Temp 36 C 06/06/24 12:00  Pulse 96 06/06/24 12:02  Resp 13 06/06/24 12:02  SpO2 100 % 06/06/24 12:02  Vitals shown include unfiled device data.  Last Pain:  Vitals:   06/06/24 1200  TempSrc:   PainSc: Asleep         Complications: No notable events documented.

## 2024-06-06 NOTE — H&P (Signed)
 Pre-Procedure H&P   Patient ID: Russell Hansen is a 43 y.o. male.  Gastroenterology Provider: [name], DO  Referring Provider: Delmar Gails, NP PCP: Steva Clotilda DEL, NP  Date: 06/06/2024  HPI Russell Hansen is a 43 y.o. male who presents today for Esophagogastroduodenoscopy and Colonoscopy for Russell Hansen, colorectal cancer screening.  Worsening gerd symptoms with odynophagia and chest discomfort. Since starting ppi, many of these symptoms have improved.  Active alcohol use.   BM moving regularly  MRI 10/2023 with gastric wall thickening.  Question of cirrhotic findings  Hemoglobin 15.1 MCV 95 platelets 240,000 creatinine 0.7  No family history of colon cancer or colon polyps   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   Vitamin D  deficiency 09/04/2017    Past Surgical History:  Procedure Laterality Date   CHONDROPLASTY Right 03/05/2018   Procedure: CHONDROPLASTY;  Surgeon: Tobie Priest, MD;  Location: [facility];  Service: Orthopedics;  Laterality: Right;   EYE SURGERY     KNEE ARTHROSCOPY WITH MENISCAL REPAIR Right 03/05/2018   Procedure: KNEE ARTHROSCOPY WITH MENISCAL REPAIR;  Surgeon: Tobie Priest, MD;  Location: [facility];  Service: Orthopedics;  Laterality: Right;   KNEE SURGERY Bilateral 2003, 2013   ACL REPAIR   MOUTH SURGERY     dental implant   PROSTATE BIOPSY N/A 09/02/2021   Procedure: PROSTATE BIOPSY GRAYCE;  Surgeon: Kassie Ozell SAUNDERS,  MD;  Location: [facility];  Service: Urology;  Laterality: N/A;   WISDOM TOOTH EXTRACTION      Family History No h/o GI disease or malignancy  Review of Systems  Constitutional:  Negative for activity change, appetite change, chills, diaphoresis, fatigue, fever and unexpected weight change.  HENT:  Positive for trouble swallowing. Negative for voice change.   Respiratory:  Negative for shortness of breath and wheezing.   Cardiovascular:  Negative for chest pain, palpitations and leg swelling.  Gastrointestinal:  Negative for abdominal distention, abdominal pain, anal bleeding, blood in stool, constipation, diarrhea, nausea and vomiting.  Musculoskeletal:  Negative for arthralgias and myalgias.  Skin:  Negative for color change and pallor.  Neurological:  Negative for dizziness, syncope and weakness.  Psychiatric/Behavioral:  Negative for confusion. The patient is not nervous/anxious.   All other systems reviewed and are negative.    Medications No current facility-administered medications on file prior to encounter.   Current Outpatient Medications on File Prior to Encounter  Medication Sig Dispense Refill   amLODipine  (NORVASC ) 2.5 MG tablet Take 3 tablets (7.5 mg total) by mouth daily. For hypertension. 30 tablet 0   propranolol  (INDERAL ) 10 MG tablet Take 1 tablet (10 mg total) by mouth 4 (four) times daily as needed (anxiety or fast heart rate). 120 tablet 0   cyanocobalamin  (VITAMIN B12) 1000 MCG tablet Take 1 tablet (1,000 mcg total) by mouth daily. For B-12 replacement 30 tablet 0   diclofenac  Sodium (VOLTAREN ) 1 %  GEL Apply 2 g topically 2 (two) times daily as needed (topical for pain.). (May buy from over the counter): For pain.     escitalopram  (LEXAPRO ) 5 MG tablet Take 1 tablet (5 mg total) by mouth daily. For depression 30 tablet 0   folic acid  (FOLVITE ) 1 MG tablet Take 1 tablet (1 mg total) by mouth daily. For Folate supplimentation. 30 tablet 0   gabapentin  (NEURONTIN )  300 MG capsule Take 1 capsule (300 mg total) by mouth 3 (three) times daily. For anxiety/withdrawal syndrome. 90 capsule 0   ibuprofen  (ADVIL ) 800 MG tablet Take 1 tablet (800 mg total) by mouth every 8 (eight) hours as needed. (Patient taking differently: Take 800 mg by mouth every 8 (eight) hours as needed for mild pain (pain score 1-3).) 30 tablet 0   Multiple Vitamin (MULTIVITAMIN WITH MINERALS) TABS tablet Take 1 tablet by mouth daily. For vitamin supplementation.     naproxen  (NAPROSYN ) 500 MG tablet Take 1 tablet (500 mg total) by mouth 2 (two) times daily with a meal. (May buy from over the counter): For pain     nicotine  (NICODERM CQ  - DOSED IN MG/24 HOURS) 14 mg/24hr patch Place 1 patch (14 mg total) onto the skin daily. (May buy from over the counter): For smoking cessation.     pantoprazole  (PROTONIX ) 20 MG tablet Take 1 tablet (20 mg total) by mouth daily. Acid reflux. 30 tablet 0   thiamine  (VITAMIN B1) 100 MG tablet Take 1 tablet (100 mg total) by mouth daily. (May buy from over the counter): For thiamine  replacement. 30 tablet 0    Pertinent medications related to GI and procedure were reviewed by me with the patient prior to the procedure   Current Facility-Administered Medications:    0.9 %  sodium chloride  infusion, , Intravenous, Continuous, Onita Elspeth Sharper, DO, Last Rate: 20 mL/hr at 06/06/24 1000, New Bag at 06/06/24 1000  sodium chloride  20 mL/hr at 06/06/24 1000       Allergies  Allergen Reactions   Doxycycline Itching   Norco [Hydrocodone -Acetaminophen ] Itching   Oxycodone  Itching   Allergies were reviewed by me prior to the procedure  Objective   Body mass index is 20.68 kg/m. Vitals:   06/06/24 0949 06/06/24 1000  BP:  (!) 147/104  Pulse:  75  Resp:  18  Temp:  (!) 96.9 F (36.1 C)  TempSrc:  Temporal  SpO2:  100%  Weight: 61.7 kg   Height: 5' 8 (1.727 m)      Physical Exam Vitals and nursing note reviewed.  Constitutional:       General: He is not in acute distress.    Appearance: Normal appearance. He is not ill-appearing, toxic-appearing or diaphoretic.  HENT:     Head: Normocephalic and atraumatic.     Nose: Nose normal.     Mouth/Throat:     Mouth: Mucous membranes are moist.     Pharynx: Oropharynx is clear.  Eyes:     General: No scleral icterus.    Extraocular Movements: Extraocular movements intact.  Cardiovascular:     Rate and Rhythm: Normal rate and regular rhythm.     Heart sounds: Normal heart sounds. No murmur heard.    No friction rub. No gallop.  Pulmonary:     Effort: Pulmonary effort is normal. No respiratory distress.     Breath sounds: Normal breath sounds. No wheezing, rhonchi or rales.  Abdominal:     General: Bowel sounds are normal. There is no  distension.     Palpations: Abdomen is soft.     Tenderness: There is no abdominal tenderness. There is no guarding or rebound.  Musculoskeletal:     Cervical back: Neck supple.     Right lower leg: No edema.     Left lower leg: No edema.  Skin:    General: Skin is warm and dry.     Coloration: Skin is not jaundiced or pale.  Neurological:     General: No focal deficit present.     Mental Status: He is alert and oriented to person, place, and time. Mental status is at baseline.  Psychiatric:        Mood and Affect: Mood normal.        Behavior: Behavior normal.        Thought Content: Thought content normal.        Judgment: Judgment normal.      Assessment:  Russell Hansen is a 43 y.o. male  who presents today for Esophagogastroduodenoscopy and Colonoscopy for Russell Hansen, colorectal cancer screening .  Plan:  Esophagogastroduodenoscopy and Colonoscopy with possible intervention today  Esophagogastroduodenoscopy and Colonoscopy with possible biopsy, control of bleeding, polypectomy, and interventions as necessary has been discussed with the patient/patient representative. Informed consent was obtained from the patient/patient  representative after explaining the indication, nature, and risks of the procedure including but not limited to death, bleeding, perforation, missed neoplasm/lesions, cardiorespiratory compromise, and reaction to medications. Opportunity for questions was given and appropriate answers were provided. Patient/patient representative has verbalized understanding is amenable to undergoing the procedure.   [name], DO  [facility]  Portions of the record may have been created with voice recognition software. Occasional wrong-word or 'sound-a-like' substitutions may have occurred due to the inherent limitations of voice recognition software.  Read the chart carefully and recognize, using context, where substitutions may have occurred.

## 2024-06-06 NOTE — Interval H&P Note (Signed)
 History and Physical Interval Note: Preprocedure H&P from 06/06/24  was reviewed and there was no interval change after seeing and examining the patient.  Written consent was obtained from the patient after discussion of risks, benefits, and alternatives. Patient has consented to proceed with Esophagogastroduodenoscopy and Colonoscopy with possible intervention   06/06/2024 11:04 AM  Russell Hansen  has presented today for surgery, with the diagnosis of K21.9 (ICD-10-CM) - Gastroesophageal reflux disease, unspecified whether esophagitis present Z12.11 (ICD-10-CM) - Colon cancer screening.  The various methods of treatment have been discussed with the patient and family. After consideration of risks, benefits and other options for treatment, the patient has consented to  Procedure(s): COLONOSCOPY (N/A) EGD (ESOPHAGOGASTRODUODENOSCOPY) (N/A) as a surgical intervention.  The patient's history has been reviewed, patient examined, no change in status, stable for surgery.  I have reviewed the patient's chart and labs.  Questions were answered to the patient's satisfaction.     [name]

## 2024-06-06 NOTE — Anesthesia Procedure Notes (Signed)
 Procedure Name: MAC Date/Time: 06/06/2024 11:10 AM  Performed by: Lorrene Camelia LABOR, CRNAPre-anesthesia Checklist: Patient identified, Emergency Drugs available, Suction available and Patient being monitored Patient Re-evaluated:Patient Re-evaluated prior to induction Oxygen Delivery Method: Simple face mask Preoxygenation: Pre-oxygenation with 100% oxygen Induction Type: IV induction Comments: POM

## 2024-06-06 NOTE — Anesthesia Preprocedure Evaluation (Signed)
 Anesthesia Evaluation  Patient identified by MRN, date of birth, ID band Patient awake    Reviewed: Allergy & Precautions, NPO status , Patient's Chart, lab work & pertinent test results  History of Anesthesia Complications Negative for: history of anesthetic complications  Airway Mallampati: I  TM Distance: >3 FB Neck ROM: full    Dental  (+) Dental Advidsory Given, Implants, Teeth Intact   Pulmonary neg shortness of breath, asthma , neg sleep apnea, neg recent URI, Current Smoker   Pulmonary exam normal        Cardiovascular Exercise Tolerance: Good hypertension, On Medications (-) angina (-) Past MI and (-) Cardiac Stents Normal cardiovascular exam(-) dysrhythmias (-) Valvular Problems/Murmurs     Neuro/Psych  PSYCHIATRIC DISORDERS Anxiety Depression    negative neurological ROS     GI/Hepatic ,GERD  Controlled and Medicated,,(+) Hepatitis -  Endo/Other  negative endocrine ROS    Renal/GU negative Renal ROS     Musculoskeletal  (+) Arthritis ,    Abdominal   Peds  Hematology  (+) Blood dyscrasia, anemia   Anesthesia Other Findings Past Medical History: No date: Alcohol abuse No date: Anemia No date: Anxiety No date: Arthritis No date: Asthma     Comment:  EXERCISE INDUCED-NO INHALERS No date: Complication of anesthesia     Comment:  PT STATES HE GETS REALLY ANXIOUS WITH ANESTHESIA  AND               HAS TO BE GIVEN SOMETHING PRIOR TO GOING BACK TO OR No date: Elevated liver enzymes No date: GERD (gastroesophageal reflux disease)     Comment:  OCC No date: Hypertension  Past Surgical History: 03/05/2018: CHONDROPLASTY; Right     Comment:  Procedure: CHONDROPLASTY;  Surgeon: Tobie Priest, MD;                Location: [facility];  Service: Orthopedics;  Laterality:               Right; No date: EYE SURGERY 03/05/2018: KNEE ARTHROSCOPY WITH MENISCAL REPAIR; Right     Comment:  Procedure: KNEE ARTHROSCOPY  WITH MENISCAL REPAIR;                Surgeon: Tobie Priest, MD;  Location: [facility];  Service:              Orthopedics;  Laterality: Right; 2003, 2013: KNEE SURGERY; Bilateral     Comment:  ACL REPAIR No date: MOUTH SURGERY     Comment:  dental implant 09/02/2021: PROSTATE BIOPSY; N/A     Comment:  Procedure: PROSTATE BIOPSY URONAV;  Surgeon: Kassie Ozell SAUNDERS, MD;  Location: [facility];  Service: Urology;                Laterality: N/A; No date: WISDOM TOOTH EXTRACTION     Reproductive/Obstetrics negative OB ROS                              Anesthesia Physical Anesthesia Plan  ASA: 2  Anesthesia Plan: General   Post-op Pain Management:    Induction: Intravenous  PONV Risk Score and Plan: 2 and Propofol  infusion, TIVA and Treatment may vary due to age or medical condition  Airway Management Planned: Natural Airway and Nasal Cannula  Additional Equipment:   Intra-op Plan:   Post-operative Plan:   Informed Consent: I have  reviewed the patients History and Physical, chart, labs and discussed the procedure including the risks, benefits and alternatives for the proposed anesthesia with the patient or authorized representative who has indicated his/her understanding and acceptance.     Dental Advisory Given  Plan Discussed with: Anesthesiologist, CRNA and Surgeon  Anesthesia Plan Comments:         Anesthesia Quick Evaluation

## 2024-06-06 NOTE — Op Note (Signed)
 [facility]enter[facility] Patient Name: Russell Hansen Procedure Date: 06/06/2024 11:01 AM MRN: [id number] Account #: [id number] Date of Birth: 02-27-1981 Admit Type: Outpatient Age: 43 Room: [facility] ENDO ROOM 1 Gender: Male Note Status: Finalized Instrument Name: Colon Scope [id number] Procedure:             Colonoscopy Indications:           Screening for colorectal malignant neoplasm Providers:             [name] DO, DO Referring MD:          clotilda coward, md Medicines:             Monitored Anesthesia Care Complications:         No immediate complications. Estimated blood loss: None. Procedure:             Pre-Anesthesia Assessment:                        - Prior to the procedure, a History and Physical was                         performed, and patient medications and allergies were                         reviewed. The patient is competent. The risks and                         benefits of the procedure and the sedation options and                         risks were discussed with the patient. All questions                         were answered and informed consent was obtained.                         Patient identification and proposed procedure were                         verified by the physician, the nurse, the anesthetist                         and the technician in the endoscopy suite. Mental                         Status Examination: alert and oriented. Airway                         Examination: normal oropharyngeal airway and neck                         mobility. Respiratory Examination: clear to                         auscultation. CV Examination: RRR, no murmurs, no S3                         or S4. Prophylactic Antibiotics: The patient does not  require prophylactic antibiotics. Prior                         Anticoagulants: The patient has taken no anticoagulant                         or antiplatelet  agents. ASA Grade Assessment: II - A                         patient with mild systemic disease. After reviewing                         the risks and benefits, the patient was deemed in                         satisfactory condition to undergo the procedure. The                         anesthesia plan was to use monitored anesthesia care                         (MAC). Immediately prior to administration of                         medications, the patient was re-assessed for adequacy                         to receive sedatives. The heart rate, respiratory                         rate, oxygen saturations, blood pressure, adequacy of                         pulmonary ventilation, and response to care were                         monitored throughout the procedure. The physical                         status of the patient was re-assessed after the                         procedure.                        After obtaining informed consent, the colonoscope was                         passed under direct vision. Throughout the procedure,                         the patient's blood pressure, pulse, and oxygen                         saturations were monitored continuously. The                         Colonoscope was introduced through the anus and  advanced to the the cecum, identified by appendiceal                         orifice and ileocecal valve. The colonoscopy was                         performed without difficulty. The patient tolerated                         the procedure well. The quality of the bowel                         preparation was evaluated using the BBPS [facility] Bowel                         Preparation Scale) with scores of: Right Colon = 3,                         Transverse Colon = 3 and Left Colon = 3 (entire mucosa                         seen well with no residual staining, small fragments                         of stool or opaque liquid). The  total BBPS score                         equals 9. The ileocecal valve, appendiceal orifice,                         and rectum were photographed. Findings:      The perianal and digital rectal examinations were normal. Pertinent       negatives include normal sphincter tone.      Non-bleeding internal hemorrhoids were found during retroflexion. The       hemorrhoids were Grade I (internal hemorrhoids that do not prolapse).       Estimated blood loss: none.      The entire examined colon appeared normal on direct and retroflexion       views. Impression:            - Non-bleeding internal hemorrhoids.                        - The entire examined colon is normal on direct and                         retroflexion views.                        - No specimens collected. Recommendation:        - Patient has a contact number available for                         emergencies. The signs and symptoms of potential                         delayed complications were discussed with the patient.  Return to normal activities tomorrow. Written                         discharge instructions were provided to the patient.                        - Discharge patient to home.                        - Resume previous diet.                        - Continue present medications.                        - Repeat colonoscopy in 10 years for screening                         purposes.                        - Return to referring physician as previously                         scheduled.                        - The findings and recommendations were discussed with                         the patient.                        - The findings and recommendations were discussed with                         the patient's family. Procedure Code(s):     --- Professional ---                        [phone], Colonoscopy, flexible; diagnostic, including                         collection of specimen(s) by  brushing or washing, when                         performed (separate procedure) Diagnosis Code(s):     --- Professional ---                        Z12.11, Encounter for screening for malignant neoplasm                         of colon                        K64.0, First degree hemorrhoids CPT copyright 2022 [employer]. All rights reserved. The codes documented in this report are preliminary and upon coder review may  be revised to meet current compliance requirements. Attending Participation:      I personally performed the entire procedure. Elspeth Jungling, DO [name] DO, DO 06/06/2024 12:04:10 PM This report has been signed electronically. Number of Addenda: 0 Note Initiated  On: 06/06/2024 11:01 AM Scope Withdrawal Time: 0 hours 9 minutes 35 seconds  Total Procedure Duration: 0 hours 15 minutes 7 seconds  Estimated Blood Loss:  Estimated blood loss: none.      [facility]

## 2024-06-06 NOTE — Op Note (Signed)
 [facility]astroenterology Patient Name: Russell Hansen Procedure Date: 06/06/2024 11:02 AM MRN: [id number] Account #: [id number] Date of Birth: 16-Jul-1981 Admit Type: Outpatient Age: 43 Room: [facility] ENDO ROOM 1 Gender: Male Note Status: Finalized Instrument Name: Upper GI Scope [id number] Procedure:             Upper GI endoscopy Indications:           Esophageal reflux Providers:             [name] DO, DO Medicines:             Monitored Anesthesia Care Complications:         No immediate complications. Estimated blood loss:                         Minimal. Procedure:             Pre-Anesthesia Assessment:                        - Prior to the procedure, a History and Physical was                         performed, and patient medications and allergies were                         reviewed. The patient is competent. The risks and                         benefits of the procedure and the sedation options and                         risks were discussed with the patient. All questions                         were answered and informed consent was obtained.                         Patient identification and proposed procedure were                         verified by the physician, the nurse, the anesthetist                         and the technician in the endoscopy suite. Mental                         Status Examination: alert and oriented. Airway                         Examination: normal oropharyngeal airway and neck                         mobility. Respiratory Examination: clear to                         auscultation. CV Examination: RRR, no murmurs, no S3                         or S4. Prophylactic Antibiotics: The patient does  not                         require prophylactic antibiotics. Prior                         Anticoagulants: The patient has taken no anticoagulant                         or antiplatelet agents. ASA Grade Assessment:  II - A                         patient with mild systemic disease. After reviewing                         the risks and benefits, the patient was deemed in                         satisfactory condition to undergo the procedure. The                         anesthesia plan was to use monitored anesthesia care                         (MAC). Immediately prior to administration of                         medications, the patient was re-assessed for adequacy                         to receive sedatives. The heart rate, respiratory                         rate, oxygen saturations, blood pressure, adequacy of                         pulmonary ventilation, and response to care were                         monitored throughout the procedure. The physical                         status of the patient was re-assessed after the                         procedure.                        After obtaining informed consent, the endoscope was                         passed under direct vision. Throughout the procedure,                         the patient's blood pressure, pulse, and oxygen                         saturations were monitored continuously. The Endoscope  was introduced through the mouth, and advanced to the                         second part of duodenum. The upper GI endoscopy was                         accomplished without difficulty. The patient tolerated                         the procedure well. Findings:      The duodenal bulb, first portion of the duodenum and second portion of       the duodenum were normal. Estimated blood loss: none.      Diffuse moderate inflammation characterized by erythema and granularity       was found in the entire examined stomach. Biopsies were taken with a       cold forceps for Helicobacter pylori testing. Estimated blood loss was       minimal.      A medium-sized hiatal hernia was present. Estimated blood loss: none.      The  Z-line was regular.      Esophagogastric landmarks were identified: the gastroesophageal junction       was found at 40 cm from the incisors.      Diffuse mild mucosal changes characterized by a decreased vascular       pattern were found in the entire esophagus. Biopsies were obtained from       the proximal and distal esophagus with cold forceps for histology of       suspected eosinophilic esophagitis. Estimated blood loss was minimal.      One benign-appearing, intrinsic moderate stenosis was found 40 cm from       the incisors. This stenosis measured 1.4 cm (inner diameter) x less than       one cm (in length). The stenosis was traversed. A TTS dilator was passed       through the scope. Dilation with a 15-16.5-18 mm balloon dilator was       performed to 15 mm. The dilation site was examined following endoscope       reinsertion and showed mild mucosal disruption. Estimated blood loss was       minimal.      The exam of the esophagus was otherwise normal. Impression:            - Normal duodenal bulb, first portion of the duodenum                         and second portion of the duodenum.                        - Gastritis. Biopsied.                        - Medium-sized hiatal hernia.                        - Z-line regular.                        - Esophagogastric landmarks identified.                        -  Decreased vascular pattern mucosa in the esophagus.                        - Benign-appearing esophageal stenosis. Dilated.                        - Biopsies were taken with a cold forceps for                         evaluation of eosinophilic esophagitis. Recommendation:        - Patient has a contact number available for                         emergencies. The signs and symptoms of potential                         delayed complications were discussed with the patient.                         Return to normal activities tomorrow. Written                          discharge instructions were provided to the patient.                        - Discharge patient to home.                        - Resume previous diet.                        - Continue present medications.                        - increase proton pump inhibitor to twice a day. take                         20-30 min prior to breakfast and supper.                        - Await pathology results.                        - Repeat upper endoscopy in 3 weeks for retreatment.                        - Return to GI office as previously scheduled.                        - The findings and recommendations were discussed with                         the patient.                        - The findings and recommendations were discussed with                         the patient's family. Procedure Code(s):     --- Professional ---  [phone], Esophagogastroduodenoscopy, flexible,                         transoral; with transendoscopic balloon dilation of                         esophagus (less than 30 mm diameter)                        43239, 59, Esophagogastroduodenoscopy, flexible,                         transoral; with biopsy, single or multiple Diagnosis Code(s):     --- Professional ---                        K29.70, Gastritis, unspecified, without bleeding                        K44.9, Diaphragmatic hernia without obstruction or                         gangrene                        K22.89, Other specified disease of esophagus                        K22.2, Esophageal obstruction                        K21.9, Gastro-esophageal reflux disease without                         esophagitis CPT copyright 2022 [employer]. All rights reserved. The codes documented in this report are preliminary and upon coder review may  be revised to meet current compliance requirements. Attending Participation:      I personally performed the entire procedure. Elspeth Jungling,  DO [name] DO, DO 06/06/2024 11:55:21 AM This report has been signed electronically. Number of Addenda: 0 Note Initiated On: 06/06/2024 11:02 AM Estimated Blood Loss:  Estimated blood loss was minimal.      [facility]

## 2024-06-07 ENCOUNTER — Encounter: Payer: Self-pay | Admitting: Gastroenterology

## 2024-06-07 LAB — SURGICAL PATHOLOGY

## 2024-06-25 NOTE — Progress Notes (Deleted)
  Cardiology Office Note   Date:  06/25/2024  ID:  Russell Hansen, DOB 08/16/1981, MRN [id number] PCP: Steva Clotilda DEL, NP  [facility] Providers Cardiologist:  None { Click to update primary MD,subspecialty MD or APP then REFRESH:1}    History of Present Illness Russell Hansen is a 43 y.o. male PMH HTN, alcohol dependence who presents for further evaluation and management of chest discomfort.  Patient was seen in the ED on 04/27/2024 after developing chest pain that appeared to correlate with eating.  Troponin was negative.  ECG was unremarkable.  Overall, it was felt that his presentation was most consistent with a GI syndrome, so he was discharged from the ED.  ***.  Last LDL 63 05/2024.  Relevant CVD History -None   ROS: Pt denies any chest discomfort, jaw pain, arm pain, palpitations, syncope, presyncope, orthopnea, PND, or LE edema.  Studies Reviewed I have independently reviewed the patient's ECG, ***.  Physical Exam VS:  There were no vitals taken for this visit.       Wt Readings from Last 3 Encounters:  06/06/24 136 lb (61.7 kg)  05/17/24 147 lb (66.7 kg)  05/15/24 145 lb (65.8 kg)    GEN: No acute distress. NECK: No JVD; No carotid bruits. CARDIAC: ***RRR, no murmurs, rubs, gallops. RESPIRATORY:  Clear to auscultation. EXTREMITIES:  Warm and well-perfused. No edema.  ASSESSMENT AND PLAN Chest discomfort ***        {Are you ordering a CV Procedure (e.g. stress test, cath, DCCV, TEE, etc)?   Press F2        :[id number]}  Dispo: ***  Signed, Caron Poser, MD

## 2024-06-26 ENCOUNTER — Encounter: Payer: Self-pay | Admitting: Gastroenterology

## 2024-06-27 ENCOUNTER — Ambulatory Visit

## 2024-07-04 ENCOUNTER — Encounter
Admission: RE | Disposition: A | Discharge status: Home / Self Care | Payer: Self-pay | Source: Home / Self Care | Attending: Gastroenterology

## 2024-07-04 ENCOUNTER — Encounter: Payer: Self-pay | Admitting: Gastroenterology

## 2024-07-04 ENCOUNTER — Ambulatory Visit

## 2024-07-04 ENCOUNTER — Ambulatory Visit
Admission: RE | Admit: 2024-07-04 | Discharge: 2024-07-04 | Disposition: A | Discharge status: Home / Self Care | Attending: Gastroenterology | Admitting: Gastroenterology

## 2024-07-04 ENCOUNTER — Other Ambulatory Visit: Payer: Self-pay

## 2024-07-04 DIAGNOSIS — Z79899 Other long term (current) drug therapy: Secondary | ICD-10-CM | POA: Insufficient documentation

## 2024-07-04 DIAGNOSIS — Z09 Encounter for follow-up examination after completed treatment for conditions other than malignant neoplasm: Secondary | ICD-10-CM | POA: Insufficient documentation

## 2024-07-04 DIAGNOSIS — K219 Gastro-esophageal reflux disease without esophagitis: Secondary | ICD-10-CM | POA: Insufficient documentation

## 2024-07-04 DIAGNOSIS — K449 Diaphragmatic hernia without obstruction or gangrene: Secondary | ICD-10-CM | POA: Insufficient documentation

## 2024-07-04 DIAGNOSIS — I10 Essential (primary) hypertension: Secondary | ICD-10-CM | POA: Insufficient documentation

## 2024-07-04 DIAGNOSIS — K222 Esophageal obstruction: Secondary | ICD-10-CM | POA: Diagnosis not present

## 2024-07-04 HISTORY — PX: ESOPHAGOGASTRODUODENOSCOPY: SHX5428

## 2024-07-04 SURGERY — EGD (ESOPHAGOGASTRODUODENOSCOPY)
Anesthesia: General

## 2024-07-04 MED ORDER — LIDOCAINE HCL (CARDIAC) PF 100 MG/5ML IV SOSY
PREFILLED_SYRINGE | INTRAVENOUS | Status: DC | PRN
  Administered 2024-07-04: 100 mg via INTRAVENOUS

## 2024-07-04 MED ORDER — ESMOLOL HCL 100 MG/10ML IV SOLN
INTRAVENOUS | Status: AC
  Filled 2024-07-04: qty 10

## 2024-07-04 MED ORDER — SODIUM CHLORIDE 0.9 % IV SOLN: INTRAVENOUS | Status: DC

## 2024-07-04 MED ORDER — PROPOFOL 1000 MG/100ML IV EMUL
INTRAVENOUS | Status: AC
  Filled 2024-07-04: qty 100

## 2024-07-04 MED ORDER — ESMOLOL HCL 100 MG/10ML IV SOLN
INTRAVENOUS | Status: DC | PRN
Start: 2024-07-04 — End: 2024-07-04
  Administered 2024-07-04: 20 mg via INTRAVENOUS
  Administered 2024-07-04: 30 mg via INTRAVENOUS

## 2024-07-04 MED ORDER — LIDOCAINE HCL (PF) 2 % IJ SOLN
INTRAMUSCULAR | Status: AC
  Filled 2024-07-04: qty 5

## 2024-07-04 MED ORDER — PROPOFOL 10 MG/ML IV BOLUS
INTRAVENOUS | Status: DC | PRN
  Administered 2024-07-04 (×2): 50 mg via INTRAVENOUS
  Administered 2024-07-04: 200 mg via INTRAVENOUS
  Administered 2024-07-04: 100 mg via INTRAVENOUS

## 2024-07-04 NOTE — Anesthesia Postprocedure Evaluation (Signed)
 Anesthesia Post Note  Patient: Russell Hansen  Procedure(s) Performed: EGD (ESOPHAGOGASTRODUODENOSCOPY)  Patient location during evaluation: Endoscopy Anesthesia Type: General Level of consciousness: awake and alert Pain management: pain level controlled Vital Signs Assessment: post-procedure vital signs reviewed and stable Respiratory status: spontaneous breathing, nonlabored ventilation and respiratory function stable Cardiovascular status: blood pressure returned to baseline and stable Postop Assessment: no apparent nausea or vomiting Anesthetic complications: no   No notable events documented.   Last Vitals:  Vitals:   07/04/24 0813 07/04/24 0824  BP: 124/83 109/85  Pulse: 98 79  Resp: (!) 9 18  Temp:    SpO2: 100% 99%    Last Pain:  Vitals:   07/04/24 0824  TempSrc:   PainSc: 0-No pain                 Fairy POUR Shemica Meath

## 2024-07-04 NOTE — Op Note (Signed)
 [facility] Gastroenterology Patient Name: Russell Hansen Procedure Date: 07/04/2024 7:10 AM MRN: [id number] Account #: [id number] Date of Birth: 24-Sep-1981 Admit Type: Outpatient Age: 43 Room: [facility] ENDO ROOM 1 Gender: Male Note Status: Finalized Instrument Name: Upper GI Scope [id number] Procedure:             Upper GI endoscopy Indications:           gerd, gastritis, esophageal stenosis Providers:             Elspeth Ozell Onita ROSALEA, DO Referring MD:          Steva Kirsch Medicines:             Monitored Anesthesia Care Complications:         No immediate complications. Estimated blood loss:                         Minimal. Procedure:             Pre-Anesthesia Assessment:                        - Prior to the procedure, a History and Physical was                         performed, and patient medications and allergies were                         reviewed. The patient is competent. The risks and                         benefits of the procedure and the sedation options and                         risks were discussed with the patient. All questions                         were answered and informed consent was obtained.                         Patient identification and proposed procedure were                         verified by the physician, the nurse, the anesthetist                         and the technician in the endoscopy suite. Mental                         Status Examination: alert and oriented. Airway                         Examination: normal oropharyngeal airway and neck                         mobility. Respiratory Examination: clear to                         auscultation. CV Examination: RRR, no murmurs, no S3  or S4. Prophylactic Antibiotics: The patient does not                         require prophylactic antibiotics. Prior                         Anticoagulants: The patient has taken no anticoagulant                          or antiplatelet agents. ASA Grade Assessment: III - A                         patient with severe systemic disease. After reviewing                         the risks and benefits, the patient was deemed in                         satisfactory condition to undergo the procedure. The                         anesthesia plan was to use monitored anesthesia care                         (MAC). Immediately prior to administration of                         medications, the patient was re-assessed for adequacy                         to receive sedatives. The heart rate, respiratory                         rate, oxygen saturations, blood pressure, adequacy of                         pulmonary ventilation, and response to care were                         monitored throughout the procedure. The physical                         status of the patient was re-assessed after the                         procedure.                        After obtaining informed consent, the endoscope was                         passed under direct vision. Throughout the procedure,                         the patient's blood pressure, pulse, and oxygen                         saturations were monitored continuously. The Endoscope  was introduced through the mouth, and advanced to the                         second part of duodenum. The upper GI endoscopy was                         accomplished without difficulty. The patient tolerated                         the procedure fairly well. Findings:      The duodenal bulb, first portion of the duodenum and second portion of       the duodenum were normal. Estimated blood loss: none.      Localized granular mucosa was found in the gastric antrum. Estimated       blood loss: none.      A medium-sized hiatal hernia was present. Estimated blood loss: none.      Esophagogastric landmarks were identified: the gastroesophageal junction       was  found at 40 cm from the incisors.      The Z-line was regular. Estimated blood loss: none.      One benign-appearing, intrinsic moderate (circumferential scarring or       stenosis; an endoscope may pass) stenosis was found 40 cm from the       incisors. This stenosis measured 1.5 cm (inner diameter) x less than one       cm (in length). The stenosis was traversed. A TTS dilator was passed       through the scope. Dilation with a 15-16.5-18 mm balloon dilator was       performed to 15 mm and 16.5 mm. The dilation site was examined following       endoscope reinsertion and showed mild mucosal disruption. Estimated       blood loss was minimal.      The exam of the esophagus was otherwise normal. Impression:            - Normal duodenal bulb, first portion of the duodenum                         and second portion of the duodenum.                        - Granular gastric mucosa.                        - Medium-sized hiatal hernia.                        - Esophagogastric landmarks identified.                        - Z-line regular.                        - Benign-appearing esophageal stenosis. Dilated.                        - No specimens collected. Recommendation:        - Patient has a contact number available for  emergencies. The signs and symptoms of potential                         delayed complications were discussed with the patient.                         Return to normal activities tomorrow. Written                         discharge instructions were provided to the patient.                        - Discharge patient to home.                        - Soft diet today.                        - Continue present medications.                        - Can consider changing from pantoprazole  to                         omeprazole  to assess if better control of reflux                        - Repeat upper endoscopy PRN for retreatment.                        -  Return to GI office as previously scheduled.                        - The findings and recommendations were discussed with                         the patient. Procedure Code(s):     --- Professional ---                        [phone], Esophagogastroduodenoscopy, flexible,                         transoral; with transendoscopic balloon dilation of                         esophagus (less than 30 mm diameter) Diagnosis Code(s):     --- Professional ---                        K31.89, Other diseases of stomach and duodenum                        K44.9, Diaphragmatic hernia without obstruction or                         gangrene                        K22.2, Esophageal obstruction CPT copyright 2022 American Medical Association. All rights reserved. The codes documented in this report are preliminary and upon coder review may  be  revised to meet current compliance requirements. Attending Participation:      I personally performed the entire procedure. Elspeth Jungling, DO [name] DO, DO 07/04/2024 7:58:01 AM This report has been signed electronically. Number of Addenda: 0 Note Initiated On: 07/04/2024 7:10 AM Estimated Blood Loss:  Estimated blood loss was minimal.      [facility]

## 2024-07-04 NOTE — Transfer of Care (Signed)
 Immediate Anesthesia Transfer of Care Note  Patient: Russell Hansen  Procedure(s) Performed: EGD (ESOPHAGOGASTRODUODENOSCOPY)  Patient Location: Endoscopy Unit  Anesthesia Type:MAC  Level of Consciousness: awake and drowsy  Airway & Oxygen Therapy: Patient Spontanous Breathing and Patient connected to nasal cannula oxygen  Post-op Assessment: Report given to RN and Post -op Vital signs reviewed and stable  Post vital signs: Reviewed and stable  Last Vitals:  Vitals Value Taken Time  BP 117/99 07/04/24 08:00  Temp 36.1 C 07/04/24 08:00  Pulse 124 07/04/24 08:04  Resp 14 07/04/24 08:05  SpO2 100 % 07/04/24 08:04  Vitals shown include unfiled device data.  Last Pain:  Vitals:   07/04/24 0800  TempSrc: Temporal  PainSc: 0-No pain         Complications: No notable events documented.

## 2024-07-04 NOTE — H&P (Signed)
 Pre-Procedure H&P   Patient ID: Russell Hansen is a 43 y.o. male.  Gastroenterology Provider: Elspeth Russell Jungling, DO  Referring Provider: Wanda Gails, NP PCP: Steva Clotilda DEL, NP  Date: 07/04/2024  HPI [name] is a 43 y.o. male who presents today for Esophagogastroduodenoscopy for GERD, esophageal stenosis, gastritis .  Patient underwent EGD August 14 due to abnormal MRI with gastric wall thickening and reflux symptoms.  EGD demonstrated gastritis that was negative for H. pylori.  A medium size hiatal hernia was present.  Biopsies esophagus negative for EOE.  An esophageal stenosis likely related to reflux was appreciated and dilated to 15 mm with TTS balloon  Since then he has been on twice a day Protonix .  He does have some reflux symptoms, but the chest pain and other issues like difficulty swallowing have improved.  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   Vitamin D  deficiency 09/04/2017    Past Surgical History:  Procedure Laterality Date   CHONDROPLASTY Right 03/05/2018   Procedure: CHONDROPLASTY;  Surgeon: Tobie Priest, MD;  Location: [facility] ORS;  Service: Orthopedics;  Laterality: Right;   COLONOSCOPY N/A 06/06/2024   Procedure: COLONOSCOPY;  Surgeon: Jungling Elspeth Ozell, DO;  Location: [facility] ENDOSCOPY;  Service: Gastroenterology;  Laterality: N/A;   ESOPHAGOGASTRODUODENOSCOPY N/A 06/06/2024   Procedure: EGD  (ESOPHAGOGASTRODUODENOSCOPY);  Surgeon: Jungling Elspeth Ozell, DO;  Location: [facility] ENDOSCOPY;  Service: Gastroenterology;  Laterality: N/A;   EYE SURGERY     KNEE ARTHROSCOPY WITH MENISCAL REPAIR Right 03/05/2018   Procedure: KNEE ARTHROSCOPY WITH MENISCAL REPAIR;  Surgeon: Tobie Priest, MD;  Location: [facility] ORS;  Service: Orthopedics;  Laterality: Right;   KNEE SURGERY Bilateral 2003, 2013   ACL REPAIR   MOUTH SURGERY     dental implant   PROSTATE BIOPSY N/A 09/02/2021   Procedure: PROSTATE BIOPSY GRAYCE;  Surgeon: Kassie Russell SAUNDERS, MD;  Location: [facility] ORS;  Service: Urology;  Laterality: N/A;   WISDOM TOOTH EXTRACTION      Family History No h/o GI disease or malignancy  Review of Systems  Constitutional:  Negative for activity change, appetite change, chills, diaphoresis, fatigue, fever and unexpected weight change.  HENT:  Negative for trouble swallowing and voice change.   Respiratory:  Negative for shortness of breath and wheezing.   Cardiovascular:  Negative for chest pain, palpitations and leg swelling.  Gastrointestinal:  Negative for abdominal distention, abdominal pain, anal bleeding, blood in stool, constipation, diarrhea, nausea and vomiting.  Musculoskeletal:  Negative for arthralgias and myalgias.  Skin:  Negative for color change and pallor.  Neurological:  Negative for dizziness, syncope and weakness.  Psychiatric/Behavioral:  Negative for confusion. The patient is not nervous/anxious.   All other systems reviewed and are negative.    Medications No current facility-administered medications on file prior to encounter.   Current Outpatient Medications on File Prior to  Encounter  Medication Sig Dispense Refill   amLODipine  (NORVASC ) 2.5 MG tablet Take 3 tablets (7.5 mg total) by mouth daily. For hypertension. 30 tablet 0   cyanocobalamin  (VITAMIN B12) 1000 MCG tablet Take 1 tablet (1,000 mcg total) by mouth daily. For B-12 replacement 30 tablet 0   escitalopram   (LEXAPRO ) 5 MG tablet Take 1 tablet (5 mg total) by mouth daily. For depression 30 tablet 0   folic acid  (FOLVITE ) 1 MG tablet Take 1 tablet (1 mg total) by mouth daily. For Folate supplimentation. 30 tablet 0   gabapentin  (NEURONTIN ) 300 MG capsule Take 1 capsule (300 mg total) by mouth 3 (three) times daily. For anxiety/withdrawal syndrome. 90 capsule 0   ibuprofen  (ADVIL ) 800 MG tablet Take 1 tablet (800 mg total) by mouth every 8 (eight) hours as needed. (Patient taking differently: Take 800 mg by mouth every 8 (eight) hours as needed for mild pain (pain score 1-3).) 30 tablet 0   Multiple Vitamin (MULTIVITAMIN WITH MINERALS) TABS tablet Take 1 tablet by mouth daily. For vitamin supplementation.     pantoprazole  (PROTONIX ) 20 MG tablet Take 1 tablet (20 mg total) by mouth daily. Acid reflux. 30 tablet 0   propranolol  (INDERAL ) 10 MG tablet Take 1 tablet (10 mg total) by mouth 4 (four) times daily as needed (anxiety or fast heart rate). 120 tablet 0   thiamine  (VITAMIN B1) 100 MG tablet Take 1 tablet (100 mg total) by mouth daily. (May buy from over the counter): For thiamine  replacement. 30 tablet 0   diclofenac  Sodium (VOLTAREN ) 1 % GEL Apply 2 g topically 2 (two) times daily as needed (topical for pain.). (May buy from over the counter): For pain.     naproxen  (NAPROSYN ) 500 MG tablet Take 1 tablet (500 mg total) by mouth 2 (two) times daily with a meal. (May buy from over the counter): For pain     nicotine  (NICODERM CQ  - DOSED IN MG/24 HOURS) 14 mg/24hr patch Place 1 patch (14 mg total) onto the skin daily. (May buy from over the counter): For smoking cessation.      Pertinent medications related to GI and procedure were reviewed by me with the patient prior to the procedure   Current Facility-Administered Medications:    0.9 %  sodium chloride  infusion, , Intravenous, Continuous, Onita Elspeth Sharper, DO, Last Rate: 20 mL/hr at 07/04/24 0723, New Bag at 07/04/24 0723  sodium chloride  20  mL/hr at 07/04/24 9276       Allergies  Allergen Reactions   Doxycycline Itching   Norco [Hydrocodone -Acetaminophen ] Itching   Oxycodone  Itching   Allergies were reviewed by me prior to the procedure  Objective   Body mass index is 20.16 kg/m. Vitals:   07/04/24 0713  BP: (!) 153/103  Pulse: 73  Resp: 18  Temp: (!) 97 F (36.1 C)  TempSrc: Temporal  SpO2: 100%  Weight: 60.1 kg  Height: 5' 8 (1.727 m)     Physical Exam Vitals and nursing note reviewed.  Constitutional:      General: He is not in acute distress.    Appearance: Normal appearance. He is not ill-appearing, toxic-appearing or diaphoretic.  HENT:     Head: Normocephalic and atraumatic.     Nose: Nose normal.     Mouth/Throat:     Mouth: Mucous membranes are moist.     Pharynx: Oropharynx is clear.  Eyes:     General: No scleral icterus.    Extraocular Movements: Extraocular movements intact.  Cardiovascular:     Rate and Rhythm: Normal rate and regular rhythm.     Heart sounds: Normal heart sounds. No murmur heard.    No friction rub. No gallop.  Pulmonary:     Effort: Pulmonary effort is normal. No respiratory distress.     Breath sounds: Normal breath sounds. No wheezing, rhonchi or rales.  Abdominal:     General: Bowel sounds are normal. There is no distension.     Palpations: Abdomen is soft.     Tenderness: There is no abdominal tenderness. There is no guarding or rebound.  Musculoskeletal:     Cervical back: Neck supple.     Right lower leg: No edema.     Left lower leg: No edema.  Skin:    General: Skin is warm and dry.     Coloration: Skin is not jaundiced or pale.  Neurological:     General: No focal deficit present.     Mental Status: He is alert and oriented to person, place, and time. Mental status is at baseline.  Psychiatric:        Mood and Affect: Mood normal.        Behavior: Behavior normal.        Thought Content: Thought content normal.        Judgment: Judgment  normal.      Assessment:  [name] is a 43 y.o. male  who presents today for Esophagogastroduodenoscopy for GERD, esophageal stenosis, gastritis .  Plan:  Esophagogastroduodenoscopy with possible intervention today  Esophagogastroduodenoscopy with possible biopsy, control of bleeding, polypectomy, and interventions as necessary has been discussed with the patient/patient representative. Informed consent was obtained from the patient/patient representative after explaining the indication, nature, and risks of the procedure including but not limited to death, bleeding, perforation, missed neoplasm/lesions, cardiorespiratory compromise, and reaction to medications. Opportunity for questions was given and appropriate answers were provided. Patient/patient representative has verbalized understanding is amenable to undergoing the procedure.   Elspeth Russell Jungling, DO  [facility]  Portions of the record may have been created with voice recognition software. Occasional wrong-word or 'sound-a-like' substitutions may have occurred due to the inherent limitations of voice recognition software.  Read the chart carefully and recognize, using context, where substitutions may have occurred.

## 2024-07-04 NOTE — Anesthesia Preprocedure Evaluation (Signed)
 Anesthesia Evaluation  Patient identified by MRN, date of birth, ID band Patient awake    Reviewed: Allergy & Precautions, NPO status , Patient's Chart, lab work & pertinent test results  History of Anesthesia Complications (+) history of anesthetic complications  Airway Mallampati: III  TM Distance: >3 FB Neck ROM: full    Dental  (+) Chipped   Pulmonary neg shortness of breath, asthma , Current Smoker   Pulmonary exam normal        Cardiovascular Exercise Tolerance: Good hypertension, Normal cardiovascular exam     Neuro/Psych negative neurological ROS     GI/Hepatic ,GERD  Controlled,,(+) Hepatitis -  Endo/Other  negative endocrine ROS    Renal/GU Renal disease  negative genitourinary   Musculoskeletal   Abdominal   Peds  Hematology negative hematology ROS (+)   Anesthesia Other Findings Patient reports that they do not think that any food or pills are stuck in their throat at this time.  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/2017: Urine test positive for microalbuminuria 11/20/2020: Vasculogenic erectile dysfunction 09/04/2017: Vitamin D  deficiency  Past Surgical History: 03/05/2018: CHONDROPLASTY; Right     Comment:  Procedure: CHONDROPLASTY;  Surgeon: Tobie Priest, MD;                Location: [facility] ORS;  Service: Orthopedics;  Laterality:               Right; 06/06/2024: COLONOSCOPY; N/A     Comment:  Procedure:  COLONOSCOPY;  Surgeon: Onita Elspeth Sharper,              DO;  Location: [facility] ENDOSCOPY;  Service:               Gastroenterology;  Laterality: N/A; 06/06/2024: ESOPHAGOGASTRODUODENOSCOPY; N/A     Comment:  Procedure: EGD (ESOPHAGOGASTRODUODENOSCOPY);  Surgeon:               Onita Elspeth Sharper, DO;  Location: [facility] ENDOSCOPY;                Service: Gastroenterology;  Laterality: N/A; No date: EYE SURGERY 03/05/2018: KNEE ARTHROSCOPY WITH MENISCAL REPAIR; Right     Comment:  Procedure: KNEE ARTHROSCOPY WITH MENISCAL REPAIR;                Surgeon: Tobie Priest, MD;  Location: [facility] ORS;  Service:              Orthopedics;  Laterality: Right; 2003, 2013: KNEE SURGERY; Bilateral     Comment:  ACL REPAIR No date: MOUTH SURGERY     Comment:  dental implant 09/02/2021: PROSTATE BIOPSY; N/A     Comment:  Procedure: PROSTATE BIOPSY URONAV;  Surgeon: Kassie Sharper SAUNDERS, MD;  Location: [facility] ORS;  Service: Urology;                Laterality: N/A;  No date: WISDOM TOOTH EXTRACTION  BMI    Body Mass Index: 20.16 kg/m      Reproductive/Obstetrics negative OB ROS                              Anesthesia Physical Anesthesia Plan  ASA: 3  Anesthesia Plan: General   Post-op Pain Management:    Induction: Intravenous  PONV Risk Score and Plan: Propofol  infusion and TIVA  Airway Management Planned: Natural Airway and Nasal Cannula  Additional Equipment:   Intra-op Plan:   Post-operative Plan:   Informed Consent: I have reviewed the patients History and Physical, chart, labs and discussed the procedure including the risks, benefits and alternatives for the proposed anesthesia with the patient or authorized representative who has indicated his/her understanding and acceptance.     Dental Advisory Given  Plan Discussed with: Anesthesiologist, CRNA and Surgeon  Anesthesia Plan Comments: (Patient consented for risks of anesthesia including but  not limited to:  - adverse reactions to medications - risk of airway placement if required - damage to eyes, teeth, lips or other oral mucosa - nerve damage due to positioning  - sore throat or hoarseness - Damage to heart, brain, nerves, lungs, other parts of body or loss of life  Patient voiced understanding and assent.)        Anesthesia Quick Evaluation

## 2024-07-04 NOTE — Interval H&P Note (Signed)
 History and Physical Interval Note: Preprocedure H&P from 07/04/24  was reviewed and there was no interval change after seeing and examining the patient.  Written consent was obtained from the patient after discussion of risks, benefits, and alternatives. Patient has consented to proceed with Esophagogastroduodenoscopy with possible intervention   07/04/2024 7:35 AM  Russell Hansen  has presented today for surgery, with the diagnosis of Gastroesophageal reflux disease, unspecified whether esophagitis present (K21.9) Gastritis without bleeding, unspecified chronicity, unspecified gastritis type (K29.70) Esophageal stenosis (K22.2).  The various methods of treatment have been discussed with the patient and family. After consideration of risks, benefits and other options for treatment, the patient has consented to  Procedure(s) with comments: EGD (ESOPHAGOGASTRODUODENOSCOPY) (N/A) - wants to be first thing in the AM if possible as a surgical intervention.  The patient's history has been reviewed, patient examined, no change in status, stable for surgery.  I have reviewed the patient's chart and labs.  Questions were answered to the patient's satisfaction.     [name]

## 2024-08-06 ENCOUNTER — Other Ambulatory Visit: Payer: Self-pay

## 2024-08-06 ENCOUNTER — Emergency Department
Admission: EM | Admit: 2024-08-06 | Discharge: 2024-08-06 | Disposition: A | Discharge status: Home / Self Care | Attending: Emergency Medicine | Admitting: Emergency Medicine

## 2024-08-06 ENCOUNTER — Emergency Department

## 2024-08-06 DIAGNOSIS — J189 Pneumonia, unspecified organism: Secondary | ICD-10-CM | POA: Insufficient documentation

## 2024-08-06 DIAGNOSIS — D696 Thrombocytopenia, unspecified: Secondary | ICD-10-CM | POA: Diagnosis not present

## 2024-08-06 DIAGNOSIS — E876 Hypokalemia: Secondary | ICD-10-CM | POA: Insufficient documentation

## 2024-08-06 DIAGNOSIS — R509 Fever, unspecified: Secondary | ICD-10-CM | POA: Diagnosis present

## 2024-08-06 DIAGNOSIS — Z87891 Personal history of nicotine dependence: Secondary | ICD-10-CM | POA: Insufficient documentation

## 2024-08-06 LAB — CBC
HCT: 37 % — ABNORMAL LOW (ref 39.0–52.0)
Hemoglobin: 13.5 g/dL (ref 13.0–17.0)
MCH: 36.9 pg — ABNORMAL HIGH (ref 26.0–34.0)
MCHC: 36.5 g/dL — ABNORMAL HIGH (ref 30.0–36.0)
MCV: 101.1 fL — ABNORMAL HIGH (ref 80.0–100.0)
Platelets: 83 K/uL — ABNORMAL LOW (ref 150–400)
RBC: 3.66 MIL/uL — ABNORMAL LOW (ref 4.22–5.81)
RDW: 13.1 % (ref 11.5–15.5)
WBC: 9.9 K/uL (ref 4.0–10.5)
nRBC: 0 % (ref 0.0–0.2)

## 2024-08-06 LAB — BASIC METABOLIC PANEL WITH GFR
Anion gap: 14 (ref 5–15)
BUN: 5 mg/dL — ABNORMAL LOW (ref 6–20)
CO2: 22 mmol/L (ref 22–32)
Calcium: 8.6 mg/dL — ABNORMAL LOW (ref 8.9–10.3)
Chloride: 102 mmol/L (ref 98–111)
Creatinine, Ser: 0.7 mg/dL (ref 0.61–1.24)
GFR, Estimated: >60 mL/min (ref >60)
Glucose, Bld: 86 mg/dL (ref 70–99)
Potassium: 3.1 mmol/L — ABNORMAL LOW (ref 3.5–5.1)
Sodium: 138 mmol/L (ref 135–145)

## 2024-08-06 LAB — TROPONIN I (HIGH SENSITIVITY): Troponin I (High Sensitivity): 8 ng/L (ref <18)

## 2024-08-06 LAB — RESP PANEL BY RT-PCR (RSV, FLU A&B, COVID)  RVPGX2
Influenza A by PCR: NEGATIVE
Influenza B by PCR: NEGATIVE
Resp Syncytial Virus by PCR: NEGATIVE
SARS Coronavirus 2 by RT PCR: NEGATIVE

## 2024-08-06 LAB — GROUP A STREP BY PCR: Group A Strep by PCR: NOT DETECTED

## 2024-08-06 MED ORDER — ONDANSETRON HCL 4 MG/2ML IJ SOLN
4.0000 mg | Freq: Once | INTRAMUSCULAR | Status: AC
  Administered 2024-08-06: 4 mg via INTRAVENOUS
  Filled 2024-08-06: qty 2

## 2024-08-06 MED ORDER — AMOXICILLIN 500 MG PO CAPS: 500.0000 mg | ORAL_CAPSULE | Freq: Once | ORAL | Status: DC

## 2024-08-06 MED ORDER — AMOXICILLIN 500 MG PO CAPS
1000.0000 mg | ORAL_CAPSULE | Freq: Once | ORAL | Status: AC
  Administered 2024-08-06: 1000 mg via ORAL
  Filled 2024-08-06: qty 2

## 2024-08-06 MED ORDER — SODIUM CHLORIDE 0.9 % IV BOLUS
1000.0000 mL | Freq: Once | INTRAVENOUS | Status: AC
  Administered 2024-08-06: 1000 mL via INTRAVENOUS

## 2024-08-06 MED ORDER — KETOROLAC TROMETHAMINE 15 MG/ML IJ SOLN
15.0000 mg | Freq: Once | INTRAMUSCULAR | Status: AC
  Administered 2024-08-06: 15 mg via INTRAVENOUS
  Filled 2024-08-06: qty 1

## 2024-08-06 MED ORDER — ACETAMINOPHEN 500 MG PO TABS
1000.0000 mg | ORAL_TABLET | Freq: Once | ORAL | Status: AC
  Administered 2024-08-06: 1000 mg via ORAL
  Filled 2024-08-06: qty 2

## 2024-08-06 MED ORDER — AMOXICILLIN 500 MG PO CAPS: 1000.0000 mg | ORAL_CAPSULE | Freq: Three times a day (TID) | ORAL | 0 refills | Status: AC

## 2024-08-06 NOTE — ED Notes (Signed)
Patient given discharge instructions including prescriptions x1 and importance of follow up appt as needed with stated understanding. INT removed, cannula intact, pressure dressing applied. Patient stable and ambulatory with steady even gait on dispo.

## 2024-08-06 NOTE — Discharge Instructions (Addendum)
 Take amoxicillin for the full 7-day course as prescribed for pneumonia.  Take acetaminophen  650 mg and ibuprofen  400 mg every 6 hours for pain.  Take with food.  Your platelet levels were low on testing today.  Have your doctor recheck these levels within the next 1 to 2 weeks to make sure they get back to normal.  Thank you for choosing us  for your health care today!  Please see your primary doctor this week for a follow up appointment.   If you have any new, worsening, or unexpected symptoms call your doctor right away or come back to the emergency department for reevaluation.  It was my pleasure to care for you today.   Ginnie EDISON Cyrena, MD

## 2024-08-06 NOTE — ED Triage Notes (Signed)
 Pt reports chest pain cough congestion sore throat and chills that began yesterday

## 2024-08-06 NOTE — ED Provider Notes (Signed)
 [facility] Provider Note    Event Date/Time   First MD Initiated Contact with Patient 08/06/24 [phone]     (approximate)   History   Chest Pain   HPI  Russell Hansen is a 43 y.o. male   Past medical history of occasional cigar smoker, here with 3 days of cough, congestion, subjective fevers/chills, nausea vomiting diarrhea.  No known sick contacts.  Has had a productive cough.  Has been taking Motrin  and Tylenol .  He reports no abdominal pain.  He has no dysuria.   External Medical Documents Reviewed: Previous outpatient notes      Physical Exam   Triage Vital Signs: ED Triage Vitals [08/06/24 0445]  Encounter Vitals Group     BP (!) 124/91     Girls Systolic BP Percentile      Girls Diastolic BP Percentile      Boys Systolic BP Percentile      Boys Diastolic BP Percentile      Pulse Rate (!) 110     Resp 20     Temp 98.8 F (37.1 C)     Temp Source Oral     SpO2 98 %     Weight 135 lb (61.2 kg)     Height 5' 8 (1.727 m)     Head Circumference      Peak Flow      Pain Score 9     Pain Loc      Pain Education      Exclude from Growth Chart     Most recent vital signs: Vitals:   08/06/24 0548 08/06/24 0600  BP: (!) 115/96 122/83  Pulse: 89 86  Resp: 16 16  Temp:    SpO2: 99% 99%    General: Awake, no distress.  CV:  Good peripheral perfusion.  Resp:  Normal effort.  Abd:  No distention. Other:     ED Results / Procedures / Treatments   Labs (all labs ordered are listed, but only abnormal results are displayed) Labs Reviewed  BASIC METABOLIC PANEL WITH GFR - Abnormal; Notable for the following components:      Result Value   Potassium 3.1 (*)    BUN 5 (*)    Calcium 8.6 (*)    All other components within normal limits  CBC - Abnormal; Notable for the following components:   RBC 3.66 (*)    HCT 37.0 (*)    MCV 101.1 (*)    MCH 36.9 (*)    MCHC 36.5 (*)    Platelets 83 (*)    All other components  within normal limits  RESP PANEL BY RT-PCR (RSV, FLU A&B, COVID)  [id number]  GROUP A STREP BY PCR  TROPONIN I (HIGH SENSITIVITY)     I ordered and reviewed the above labs they are notable for troponin negative, no leukocytosis  EKG  ED ECG REPORT I, Ginnie Shams, the attending physician, personally viewed and interpreted this ECG.   Date: 08/06/2024  EKG Time: 0443  Rate: 110  Rhythm: sinus tachycardia  Axis: nl  Intervals:nl  ST&T Change: no stemi -T wave inversions in inferolateral leads    RADIOLOGY I independently reviewed and interpreted chest x-ray and see right sided infiltrate I also reviewed radiologist's formal read.   PROCEDURES:  Critical Care performed: No  Procedures   MEDICATIONS ORDERED IN ED: Medications  sodium chloride  0.9 % bolus 1,000 mL (1,000 mLs Intravenous New Bag/Given 08/06/24 0544)  ketorolac  (TORADOL )  15 MG/ML injection 15 mg (15 mg Intravenous Given 08/06/24 0546)  acetaminophen  (TYLENOL ) tablet 1,000 mg (1,000 mg Oral Given 08/06/24 9392)  ondansetron  (ZOFRAN ) injection 4 mg (4 mg Intravenous Given 08/06/24 0605)  amoxicillin (AMOXIL) capsule 1,000 mg (1,000 mg Oral Given 08/06/24 0607)    IMPRESSION / MDM / ASSESSMENT AND PLAN / ED COURSE  I reviewed the triage vital signs and the nursing notes.                                Patient's presentation is most consistent with acute presentation with potential threat to life or bodily function.  Differential diagnosis includes, but is not limited to, bacterial pneumonia, viral URI, viral gastroenteritis, sepsis   The patient is on the cardiac monitor to evaluate for evidence of arrhythmia and/or significant heart rate changes.  MDM:    Symptoms of viral URI as well as nausea vomiting diarrhea in the last several days in this 43 year old with new infiltrate on chest x-ray concerning for bacterial pneumonia, no recent hospitalizations or antibiotic use, will cover with amoxicillin.  Give  antipyretics and fluids as he looks slightly dehydrated in the setting of poor p.o. intake.  Fortunately vital signs have normalized, no leukocytosis, no other significant health history and not immunocompromised I doubt sepsis or life-threatening infection at this time.  No respiratory distress or hypoxemia so I think he can be treated as outpatient.  I considered hospitalization for admission or observation low clinical suspicion of sepsis, tolerating p.o., I think he can start CAP coverage as outpatient.        FINAL CLINICAL IMPRESSION(S) / ED DIAGNOSES   Final diagnoses:  Community acquired pneumonia of right lung, unspecified part of lung  Thrombocytopenia     Rx / DC Orders   ED Discharge Orders          Ordered    amoxicillin (AMOXIL) 500 MG capsule  3 times daily        08/06/24 0558             Note:  This document was prepared using Dragon voice recognition software and may include unintentional dictation errors.    Cyrena Mylar, MD 08/06/24 [phone]

## 2024-08-16 ENCOUNTER — Encounter: Admission: RE | Payer: Self-pay | Source: Home / Self Care

## 2024-08-16 ENCOUNTER — Ambulatory Visit: Admission: RE | Admit: 2024-08-16 | Source: Home / Self Care | Admitting: Gastroenterology

## 2024-08-16 SURGERY — EGD (ESOPHAGOGASTRODUODENOSCOPY)
Anesthesia: General

## 2024-08-26 ENCOUNTER — Ambulatory Visit: Admitting: Surgery

## 2024-08-26 ENCOUNTER — Encounter: Payer: Self-pay | Admitting: Surgery

## 2024-08-26 VITALS — BP 121/85 | HR 93 | Ht 68.0 in | Wt 128.4 lb

## 2024-08-26 DIAGNOSIS — K219 Gastro-esophageal reflux disease without esophagitis: Secondary | ICD-10-CM

## 2024-08-26 DIAGNOSIS — K7030 Alcoholic cirrhosis of liver without ascites: Secondary | ICD-10-CM

## 2024-08-26 DIAGNOSIS — K449 Diaphragmatic hernia without obstruction or gangrene: Secondary | ICD-10-CM | POA: Diagnosis not present

## 2024-08-26 MED ORDER — ONDANSETRON 4 MG PO TBDP: 4.0000 mg | ORAL_TABLET | Freq: Three times a day (TID) | ORAL | 1 refills | Status: AC | PRN

## 2024-08-26 NOTE — Patient Instructions (Addendum)
 We would like for you to have some lab work done. You may do this any time at [facility]. Enter in through the Medical Mall entrance and let them know that you are there for labs.   We will get you scheduled for a CT scan and Barium Swallow.  You will follow up with Dr Jordis after we get the results of these tests.  You are scheduled for a CT and Barium Swallow at [facility] on 09/09/24. You will need to arrive there by 8:30 am and enter in through the Medical Mall entrance and check in. You may have nothing to eat or drink for 2 hours prior.  If you need to reschedule your Scan, you may do so by calling [phone]. Please let us  know if you reschedule your scan as we have to get authorization from your insurance for this.     Hiatal Hernia  A hiatal hernia occurs when part of the stomach slides above the muscle that separates the abdomen from the chest (diaphragm). A person can be born with a hiatal hernia (congenital), or it may develop over time. In almost all cases of hiatal hernia, only the top part of the stomach pushes through the diaphragm. Many people have a hiatal hernia with no symptoms. The larger the hernia, the more likely it is that you will have symptoms. In some cases, a hiatal hernia allows stomach acid to flow back into the tube that carries food from your mouth to your stomach (esophagus). This may cause heartburn symptoms. The development of heartburn symptoms may mean that you have a condition called gastroesophageal reflux disease (GERD). What are the causes? This condition is caused by a weakness in the opening (hiatus) where the esophagus passes through the diaphragm to attach to the upper part of the stomach. A person may be born with a weakness in the hiatus, or a weakness can develop over time. What increases the risk? This condition is more likely to develop in: Older people. Age is a major risk factor for a hiatal hernia, especially if you are over the age  of 6. Pregnant women. People who are overweight. People who have frequent constipation. What are the signs or symptoms? Symptoms of this condition usually develop in the form of GERD symptoms. Symptoms include: Heartburn. Upset stomach (indigestion). Trouble swallowing. Coughing or wheezing. Wheezing is making high-pitched whistling sounds when you breathe. Sore throat. Chest pain. Nausea and vomiting. How is this diagnosed? This condition may be diagnosed during testing for GERD. Tests that may be done include: X-rays of your stomach or chest. An upper gastrointestinal (GI) series. This is an X-ray exam of your GI tract that is taken after you swallow a chalky liquid that shows up clearly on the X-ray. Endoscopy. This is a procedure to look into your stomach using a thin, flexible tube that has a tiny camera and light on the end of it. How is this treated? This condition may be treated by: Dietary and lifestyle changes to help reduce GERD symptoms. Medicines. These may include: Over-the-counter antacids. Medicines that make your stomach empty more quickly. Medicines that block the production of stomach acid (H2 blockers). Stronger medicines to reduce stomach acid (proton pump inhibitors). Surgery to repair the hernia, if other treatments are not helping. If you have no symptoms, you may not need treatment. Follow these instructions at home: Lifestyle and activity Do not use any products that contain nicotine  or tobacco. These products include cigarettes, chewing tobacco,  and vaping devices, such as e-cigarettes. If you need help quitting, ask your health care provider. Try to achieve and maintain a healthy body weight. Avoid putting pressure on your abdomen. Anything that puts pressure on your abdomen increases the amount of acid that may be pushed up into your esophagus. Avoid bending over, especially after eating. Raise the head of your bed by putting blocks under the legs. This  keeps your head and esophagus higher than your stomach. Do not wear tight clothing around your chest or stomach. Try not to strain when having a bowel movement, when urinating, or when lifting heavy objects. Eating and drinking Avoid foods that can worsen GERD symptoms. These may include: Fatty foods, like fried foods. Citrus fruits, like oranges or lemon. Other foods and drinks that contain acid, like orange juice or tomatoes. Spicy food. Chocolate. Eat frequent small meals instead of three large meals a day. This helps prevent your stomach from getting too full. Eat slowly. Do not lie down right after eating. Do not eat 1-2 hours before bed. Do not drink beverages with caffeine. These include cola, coffee, cocoa, and tea. Do not drink alcohol. General instructions Take over-the-counter and prescription medicines only as told by your health care provider. Keep all follow-up visits. Your health care provider will want to check that any new prescribed medicines are helping your symptoms. Contact a health care provider if: Your symptoms are not controlled with medicines or lifestyle changes. You are having trouble swallowing. You have coughing or wheezing that will not go away. Your pain is getting worse. Your pain spreads to your arms, neck, jaw, teeth, or back. You feel nauseous or you vomit. Get help right away if: You have shortness of breath. You vomit blood. You have bright red blood in your stools. You have black, tarry stools. These symptoms may be an emergency. Get help right away. Call 911. Do not wait to see if the symptoms will go away. Do not drive yourself to the hospital. Summary A hiatal hernia occurs when part of the stomach slides above the muscle that separates the abdomen from the chest. A person may be born with a weakness in the hiatus, or a weakness can develop over time. Symptoms of a hiatal hernia may include heartburn, trouble swallowing, or sore  throat. Management of a hiatal hernia includes eating frequent small meals instead of three large meals a day. Get help right away if you vomit blood, have bright red blood in your stools, or have black, tarry stools. This information is not intended to replace advice given to you by your health care provider. Make sure you discuss any questions you have with your health care provider. Document Revised: 12/07/2021 Document Reviewed: 12/07/2021 Elsevier Patient Education  2024 [employer].

## 2024-08-28 NOTE — Progress Notes (Signed)
 Patient ID: Russell Hansen, male   DOB: 02-19-1981, 43 y.o.   MRN: [id number]  HPI Russell Hansen is a 43 y.o. male seen in consultation at the request of Dr. Onita and Ms. London PA-C for hiatal hernia and dysphagia.  He reports that he has had dysphagia for solid and also developed muscle spasms and difficulty swallowing with associated chest pain that is intermittent dull and worsening when swallowing.  No fevers no chills no evidence of food impaction.  He did have an EGD recently by Dr. Onita that I personally reviewed showing a small sliding hiatal hernia as well as a stricture that was dilated.  He does have a history of EtOH abuse and has been in remission for 3 to 4 weeks.  Please note that this is a known issues that he seems to recur with his EtOH consumption.  He does have cirrhosis but no evidence of portal hypertension.  He did have a recent MRI 9 months ago showing hepatomegaly with some mild cirrhosis.  He does have thrombocytopenia that is chronic. His recent ultrasound shows normal biliary ducts without evidence of gallstones and no definitive evidence of ascites or cirrhosis  HPI  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   Vitamin D  deficiency 09/04/2017    Past Surgical History:  Procedure Laterality Date   CHONDROPLASTY Right 03/05/2018   Procedure: CHONDROPLASTY;  Surgeon: Tobie Priest, MD;  Location: [facility] ORS;  Service: Orthopedics;  Laterality: Right;   COLONOSCOPY N/A  06/06/2024   Procedure: COLONOSCOPY;  Surgeon: Onita Elspeth Sharper, DO;  Location: [facility] ENDOSCOPY;  Service: Gastroenterology;  Laterality: N/A;   ESOPHAGOGASTRODUODENOSCOPY N/A 06/06/2024   Procedure: EGD (ESOPHAGOGASTRODUODENOSCOPY);  Surgeon: Onita Elspeth Sharper, DO;  Location: [facility] ENDOSCOPY;  Service: Gastroenterology;  Laterality: N/A;   ESOPHAGOGASTRODUODENOSCOPY N/A 07/04/2024   Procedure: EGD (ESOPHAGOGASTRODUODENOSCOPY);  Surgeon: Onita Elspeth Sharper, DO;  Location: [facility] ENDOSCOPY;  Service: Gastroenterology;  Laterality: N/A;  wants to be first thing in the AM if possible   EYE SURGERY     KNEE ARTHROSCOPY WITH MENISCAL REPAIR Right 03/05/2018   Procedure: KNEE ARTHROSCOPY WITH MENISCAL REPAIR;  Surgeon: Tobie Priest, MD;  Location: [facility] ORS;  Service: Orthopedics;  Laterality: Right;   KNEE SURGERY Bilateral 2003, 2013   ACL REPAIR   MOUTH SURGERY     dental implant   PROSTATE BIOPSY N/A 09/02/2021   Procedure: PROSTATE BIOPSY GRAYCE;  Surgeon: Kassie Sharper SAUNDERS, MD;  Location: [facility] ORS;  Service: Urology;  Laterality: N/A;   WISDOM TOOTH EXTRACTION      Family History  Problem Relation Age of Onset   Kidney disease Father     Social History Social History   Tobacco Use   Smoking status: Some Days    Types: Cigars    Passive exposure: Current   Smokeless tobacco: Never  Vaping Use   Vaping status: Never Used  Substance Use Topics  Alcohol use: Yes    Alcohol/week: 5.0 standard drinks of alcohol    Types: 5 Shots of liquor per week    Comment: BEER AND WINE OCC-STOPPED HARD LIQOUR MARCH 2018   Drug use: Not Currently    Types: Marijuana    Comment: only in the vape    Allergies  Allergen Reactions   Doxycycline Itching   Norco [Hydrocodone -Acetaminophen ] Itching   Oxycodone  Itching    Current Outpatient Medications  Medication Sig Dispense Refill   amLODipine  (NORVASC ) 2.5 MG tablet Take 3 tablets (7.5 mg total) by mouth daily. For hypertension. 30  tablet 0   cyanocobalamin  (VITAMIN B12) 1000 MCG tablet Take 1 tablet (1,000 mcg total) by mouth daily. For B-12 replacement 30 tablet 0   diclofenac  Sodium (VOLTAREN ) 1 % GEL Apply 2 g topically 2 (two) times daily as needed (topical for pain.). (May buy from over the counter): For pain.     escitalopram  (LEXAPRO ) 5 MG tablet Take 1 tablet (5 mg total) by mouth daily. For depression 30 tablet 0   folic acid  (FOLVITE ) 1 MG tablet Take 1 tablet (1 mg total) by mouth daily. For Folate supplimentation. 30 tablet 0   gabapentin  (NEURONTIN ) 300 MG capsule Take 1 capsule (300 mg total) by mouth 3 (three) times daily. For anxiety/withdrawal syndrome. 90 capsule 0   ibuprofen  (ADVIL ) 800 MG tablet Take 1 tablet (800 mg total) by mouth every 8 (eight) hours as needed. (Patient taking differently: Take 800 mg by mouth every 8 (eight) hours as needed for mild pain (pain score 1-3).) 30 tablet 0   Multiple Vitamin (MULTIVITAMIN WITH MINERALS) TABS tablet Take 1 tablet by mouth daily. For vitamin supplementation.     naproxen  (NAPROSYN ) 500 MG tablet Take 1 tablet (500 mg total) by mouth 2 (two) times daily with a meal. (May buy from over the counter): For pain     nicotine  (NICODERM CQ  - DOSED IN MG/24 HOURS) 14 mg/24hr patch Place 1 patch (14 mg total) onto the skin daily. (May buy from over the counter): For smoking cessation.     ondansetron  (ZOFRAN -ODT) 4 MG disintegrating tablet Take 1 tablet (4 mg total) by mouth every 8 (eight) hours as needed for nausea or vomiting. 20 tablet 1   pantoprazole  (PROTONIX ) 20 MG tablet Take 1 tablet (20 mg total) by mouth daily. Acid reflux. 30 tablet 0   propranolol  (INDERAL ) 10 MG tablet Take 1 tablet (10 mg total) by mouth 4 (four) times daily as needed (anxiety or fast heart rate). 120 tablet 0   thiamine  (VITAMIN B1) 100 MG tablet Take 1 tablet (100 mg total) by mouth daily. (May buy from over the counter): For thiamine  replacement. 30 tablet 0   No current  facility-administered medications for this visit.     Review of Systems Full ROS  was asked and was negative except for the information on the HPI  Physical Exam Blood pressure 121/85, pulse 93, height 5' 8 (1.727 m), weight 128 lb 6.4 oz (58.2 kg), SpO2 98%. CONSTITUTIONAL: NAD. EYES: Pupils are equal, round, Sclera are non-icteric. EARS, NOSE, MOUTH AND THROAT: The oropharynx is clear. The oral mucosa is pink and moist. Hearing is intact to voice. LYMPH NODES:  Lymph nodes in the neck are normal. RESPIRATORY:  Lungs are clear. There is normal respiratory effort, with equal breath sounds bilaterally, and without pathologic use of accessory muscles. CARDIOVASCULAR: Heart is regular without murmurs, gallops, or rubs. GI: The abdomen is  soft, enlarged  and hard liver 3 cms below right costal margin, no obvious ascites or portal HTN. There are normal bowel sounds , no rebound GU: Rectal deferred.   MUSCULOSKELETAL: Normal muscle strength and tone. No cyanosis or edema.   SKIN: Turgor is good and there are no pathologic skin lesions or ulcers. NEUROLOGIC: Motor and sensation is grossly normal. Cranial nerves are grossly intact. PSYCH:  Oriented to person, place and time. Affect is normal.  Data Reviewed I have personally reviewed the patient's imaging, laboratory findings and medical records.    Assessment/Plan 43 year old male with history of cirrhosis, hepatomegaly and alcohol abuse now in remission for 3 to 4 weeks.  He does have significant dysphagia and significant muscle spasms.  This is consistent with a primary muscular esophageal disorder.  I do not doubt that he has got reflux related to the hiatal hernia but I do think that we will have to interrogate his esophageal function with a barium swallow and manometry.  I have discussed with Dr. Onita in detail about my thought process.  They will arrange manometry and I will go ahead and arrange barium swallow.  Discussed with them at  length regarding his disease process.  Importance of complete alcohol cessation.  Also would like to repeat the CT scan to see if there is any ascites and to evaluate the hiatal hernia.  Would like to also repeat LFTs and workup to assess his liver functionality and reserve.  At this time I do not anticipate any need for emergent or urgent surgical intervention.  We will be happy to follow him in a few weeks I personally spent a total of 60 minutes in the care of the patient today including performing a medically appropriate exam/evaluation, counseling and educating, placing orders, referring and communicating with other health care professionals, documenting clinical information in the EHR, independently interpreting and reviewing images studies and coordinating care.    Laneta Luna, MD FACS General Surgeon 08/28/2024, 12:06 PM

## 2024-09-04 ENCOUNTER — Ambulatory Visit: Attending: Cardiology | Admitting: Cardiology

## 2024-09-05 ENCOUNTER — Encounter: Payer: Self-pay | Admitting: Cardiology

## 2024-09-06 ENCOUNTER — Other Ambulatory Visit
Admission: RE | Admit: 2024-09-06 | Discharge: 2024-09-06 | Disposition: A | Discharge status: Home / Self Care | Attending: Surgery | Admitting: Surgery

## 2024-09-06 DIAGNOSIS — K7030 Alcoholic cirrhosis of liver without ascites: Secondary | ICD-10-CM | POA: Insufficient documentation

## 2024-09-06 LAB — CBC WITH DIFFERENTIAL/PLATELET
Abs Immature Granulocytes: 0.01 K/uL (ref 0.00–0.07)
Basophils Absolute: 0 K/uL (ref 0.0–0.1)
Basophils Relative: 1 %
Eosinophils Absolute: 0.2 K/uL (ref 0.0–0.5)
Eosinophils Relative: 3 %
HCT: 36.2 % — ABNORMAL LOW (ref 39.0–52.0)
Hemoglobin: 12.5 g/dL — ABNORMAL LOW (ref 13.0–17.0)
Immature Granulocytes: 0 %
Lymphocytes Relative: 29 %
Lymphs Abs: 1.7 K/uL (ref 0.7–4.0)
MCH: 36.1 pg — ABNORMAL HIGH (ref 26.0–34.0)
MCHC: 34.5 g/dL (ref 30.0–36.0)
MCV: 104.6 fL — ABNORMAL HIGH (ref 80.0–100.0)
Monocytes Absolute: 0.6 K/uL (ref 0.1–1.0)
Monocytes Relative: 11 %
Neutro Abs: 3.2 K/uL (ref 1.7–7.7)
Neutrophils Relative %: 56 %
Platelets: 155 K/uL (ref 150–400)
RBC: 3.46 MIL/uL — ABNORMAL LOW (ref 4.22–5.81)
RDW: 13.5 % (ref 11.5–15.5)
WBC: 5.7 K/uL (ref 4.0–10.5)
nRBC: 0 % (ref 0.0–0.2)

## 2024-09-06 LAB — PROTIME-INR
INR: 1.1 (ref 0.8–1.2)
Prothrombin Time: 14.6 s (ref 11.4–15.2)

## 2024-09-06 LAB — COMPREHENSIVE METABOLIC PANEL WITH GFR
ALT: 138 U/L — ABNORMAL HIGH (ref 0–44)
AST: 939 U/L — ABNORMAL HIGH (ref 15–41)
Albumin: 4.1 g/dL (ref 3.5–5.0)
Alkaline Phosphatase: 176 U/L — ABNORMAL HIGH (ref 38–126)
Anion gap: 13 (ref 5–15)
BUN: 5 mg/dL — ABNORMAL LOW (ref 6–20)
CO2: 26 mmol/L (ref 22–32)
Calcium: 9.2 mg/dL (ref 8.9–10.3)
Chloride: 103 mmol/L (ref 98–111)
Creatinine, Ser: 0.66 mg/dL (ref 0.61–1.24)
GFR, Estimated: >60 mL/min (ref >60)
Glucose, Bld: 100 mg/dL — ABNORMAL HIGH (ref 70–99)
Potassium: 3.9 mmol/L (ref 3.5–5.1)
Sodium: 141 mmol/L (ref 135–145)
Total Bilirubin: 1.4 mg/dL — ABNORMAL HIGH (ref 0.0–1.2)
Total Protein: 6.3 g/dL — ABNORMAL LOW (ref 6.5–8.1)

## 2024-09-09 ENCOUNTER — Ambulatory Visit
Admission: RE | Admit: 2024-09-09 | Discharge: 2024-09-09 | Disposition: A | Discharge status: Home / Self Care | Source: Ambulatory Visit | Attending: Surgery | Admitting: Surgery

## 2024-09-09 ENCOUNTER — Other Ambulatory Visit

## 2024-09-09 DIAGNOSIS — K449 Diaphragmatic hernia without obstruction or gangrene: Secondary | ICD-10-CM | POA: Diagnosis present

## 2024-09-09 DIAGNOSIS — K219 Gastro-esophageal reflux disease without esophagitis: Secondary | ICD-10-CM | POA: Insufficient documentation

## 2024-09-09 MED ORDER — IOHEXOL 300 MG/ML  SOLN
100.0000 mL | Freq: Once | INTRAMUSCULAR | Status: AC | PRN
  Administered 2024-09-09: 100 mL via INTRAVENOUS

## 2024-09-18 ENCOUNTER — Ambulatory Visit
Admission: RE | Admit: 2024-09-18 | Discharge: 2024-09-18 | Disposition: A | Discharge status: Home / Self Care | Source: Ambulatory Visit | Attending: Surgery | Admitting: Surgery

## 2024-09-18 DIAGNOSIS — K219 Gastro-esophageal reflux disease without esophagitis: Secondary | ICD-10-CM | POA: Diagnosis present

## 2024-09-18 DIAGNOSIS — K449 Diaphragmatic hernia without obstruction or gangrene: Secondary | ICD-10-CM | POA: Diagnosis present

## 2024-09-25 ENCOUNTER — Ambulatory Visit: Admitting: Surgery

## 2024-09-25 ENCOUNTER — Telehealth: Payer: Self-pay

## 2024-09-25 ENCOUNTER — Other Ambulatory Visit: Payer: Self-pay

## 2024-09-25 ENCOUNTER — Encounter: Payer: Self-pay | Admitting: Surgery

## 2024-09-25 VITALS — BP 114/80 | HR 90 | Ht 68.0 in | Wt 128.0 lb

## 2024-09-25 DIAGNOSIS — K449 Diaphragmatic hernia without obstruction or gangrene: Secondary | ICD-10-CM | POA: Diagnosis not present

## 2024-09-25 DIAGNOSIS — K219 Gastro-esophageal reflux disease without esophagitis: Secondary | ICD-10-CM

## 2024-09-25 DIAGNOSIS — K7030 Alcoholic cirrhosis of liver without ascites: Secondary | ICD-10-CM

## 2024-09-25 NOTE — Progress Notes (Signed)
 Outpatient Surgical Follow Up  09/25/2024  Russell Hansen is an 43 y.o. male.   Chief Complaint  Patient presents with   Follow-up    HPI:  Russell Hansen is a 43 y.o. male seen in F/u for hiatal hernia and dysphagia.  He report significant improvement in dysphagia.  He also states that he has remained sober PI has helped with GERD .  No fevers no chills no evidence of food impaction.    He does have a history of EtOH abuse and has been in remission for 6 weeks.  Please note that this is a known issues that he seems to recur with his EtOH consumption.  .   Platelet count is better suggesting improvement of some degree of portal HTN.  INR and LFT are ok , CT pers reviewed hepatomegaly but no overt cirrhosis and no ascites  LFTs with elevation of AST AST and total bilirubin of 1.4   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   Vitamin D  deficiency 09/04/2017    Past Surgical History:  Procedure Laterality Date   CHONDROPLASTY Right 03/05/2018   Procedure: CHONDROPLASTY;  Surgeon: Tobie Priest, MD;  Location: [facility] ORS;  Service: Orthopedics;  Laterality: Right;   COLONOSCOPY N/A 06/06/2024   Procedure: COLONOSCOPY;  Surgeon: Onita Elspeth Sharper, DO;  Location: [facility] ENDOSCOPY;  Service: Gastroenterology;  Laterality: N/A;   ESOPHAGOGASTRODUODENOSCOPY N/A 06/06/2024   Procedure: EGD (ESOPHAGOGASTRODUODENOSCOPY);  Surgeon: Onita Elspeth Sharper, DO;  Location: [facility] ENDOSCOPY;   Service: Gastroenterology;  Laterality: N/A;   ESOPHAGOGASTRODUODENOSCOPY N/A 07/04/2024   Procedure: EGD (ESOPHAGOGASTRODUODENOSCOPY);  Surgeon: Onita Elspeth Sharper, DO;  Location: [facility] ENDOSCOPY;  Service: Gastroenterology;  Laterality: N/A;  wants to be first thing in the AM if possible   EYE SURGERY     KNEE ARTHROSCOPY WITH MENISCAL REPAIR Right 03/05/2018   Procedure: KNEE ARTHROSCOPY WITH MENISCAL REPAIR;  Surgeon: Tobie Priest, MD;  Location: [facility] ORS;  Service: Orthopedics;  Laterality: Right;   KNEE SURGERY Bilateral 2003, 2013   ACL REPAIR   MOUTH SURGERY     dental implant   PROSTATE BIOPSY N/A 09/02/2021   Procedure: PROSTATE BIOPSY GRAYCE;  Surgeon: Kassie Sharper SAUNDERS, MD;  Location: [facility] ORS;  Service: Urology;  Laterality: N/A;   WISDOM TOOTH EXTRACTION      Family History  Problem Relation Age of Onset   Kidney disease Father     Social History:  reports that he has been smoking cigars. He has been exposed to tobacco smoke. He has never used smokeless tobacco. He reports current alcohol use of about 5.0 standard drinks of alcohol per week. He reports that he does not currently use drugs after having used the following drugs: Marijuana.  Allergies:  Allergies  Allergen Reactions   Doxycycline Itching   Norco [Hydrocodone -Acetaminophen ] Itching   Oxycodone  Itching    Medications reviewed.    ROS Full ROS performed and  is otherwise negative other than what is stated in HPI   BP 114/80   Pulse 90   Ht 5' 8 (1.727 m)   Wt 128 lb (58.1 kg)   SpO2 98%   BMI 19.46 kg/m   Physical Exam  CONSTITUTIONAL: NAD. EYES: Pupils are equal, round, Sclera are non-icteric. EARS, NOSE, MOUTH AND THROAT: The oropharynx is clear. The oral mucosa is pink and moist. Hearing is intact to voice. LYMPH NODES:  Lymph nodes in the neck are normal. RESPIRATORY:  Lungs are clear. There is normal respiratory effort, with equal breath sounds bilaterally, and without pathologic use  of accessory muscles. CARDIOVASCULAR: Heart is regular without murmurs, gallops, or rubs. GI: The abdomen is  soft, enlarged but softer liver 2 cms below right costal margin, no obvious ascites or portal HTN. There are normal bowel sounds , no rebound GU: Rectal deferred.   MUSCULOSKELETAL: Normal muscle strength and tone. No cyanosis or edema.   SKIN: Turgor is good and there are no pathologic skin lesions or ulcers. NEUROLOGIC: Motor and sensation is grossly normal. Cranial nerves are grossly intact. PSYCH:  Oriented to person, place and time. Affect is normal.   Assessment/Plan: Reflux and esophageal muscular disorder Improving overall w PPI and ETOH cessation He will need to keep an eye on LFTs continue abstinence, portal htn seems better but lft still high We will repeat LFT and labwork in a few weeks Will be happy to see him back in a few months I encourage him about his sobriety disCussed with him about manometry and he is in agreement I personally spent a total of 40 minutes in the care of the patient today including performing a medically appropriate exam/evaluation, counseling and educating, placing orders, referring and communicating with other health care professionals, documenting clinical information in the EHR, independently interpreting and reviewing images studies and coordinating care.   Laneta Luna, MD [facility] General Surgeon

## 2024-09-25 NOTE — Telephone Encounter (Signed)
 Called and spoke with the patient letting him know that Dr Jordis wants him to have repeat lab work done in 1-2 weeks at [facility]. The patient is amendable to this.

## 2024-09-25 NOTE — Patient Instructions (Addendum)
 Call [facility], Dr Onita about setting up your Esophageal Manometry.    Follow-up with our office in 2-3 months.   Please call and ask to speak with a nurse if you develop questions or concerns.  GERD in Adults: What to Know  Gastroesophageal reflux (GER) is when acid from your stomach flows up into your esophagus. Your esophagus is the part of your body that moves food from your mouth to your stomach. Normally, food goes down and stays in your stomach to be digested. But with GER, food and stomach acid may go back up. You may have a disease called gastroesophageal reflux disease (GERD) if the reflux: Happens often. Causes very bad symptoms. Makes your esophagus sore and swollen. Over time, GERD can make small holes called ulcers in the lining of your esophagus. What are the causes? GERD is caused by a problem with the muscle between your esophagus and stomach. This muscle is called the lower esophageal sphincter (LES). When it's weak or not normal, it doesn't close like it should. This means food and stomach acid can go back up into your esophagus. The muscle can be weak if: You smoke or use products with tobacco in them. You're pregnant. You have a type of hernia called a hiatal hernia. You eat certain foods and drinks. These include: Alcohol. Coffee. Chocolate. Onions. Peppermint. What increases the risk? Being overweight. Having a disease that affects your connective tissue. Taking NSAIDs, such as ibuprofen . What are the signs or symptoms? Heartburn. Trouble swallowing. Pain when you swallow. The feeling of having a lump in your throat. A bitter taste in your mouth. Bad breath. Having an upset or bloated stomach. Burping. Chest pain. Other conditions can also cause chest pain. Make sure you see your health care provider if you have chest pain. Wheezing. This is when you make high-pitched whistling sounds when you breathe, most often when you breathe out. A  long-term cough or a cough at night. How is this diagnosed? GERD may be diagnosed based on your medical history and a physical exam. You may also have tests. These may include: An endoscopy. This test looks at your stomach and esophagus with a small camera. A barium swallow test. This shows the shape and size of your esophagus and how well it's working. Tests of your esophagus to check for: Acid levels. Pressure. How is this treated? Treatment may depend on how bad your symptoms are. It may include: Changes to your diet and daily life. Medicines. Surgery. Follow these instructions at home: Eating and drinking Follow an eating plan as told by your provider. You may need to avoid certain foods and drinks. These may include: Coffee and tea, with or without caffeine. Alcohol. Energy drinks and sports drinks. Fizzy drinks or sodas. Chocolate and cocoa. Peppermint and mint flavorings. Garlic and onions. Horseradish. Spicy and acidic foods. These include: Peppers. Chili powder and curry powder. Vinegar. Hot sauces and BBQ sauce. Citrus fruits and juices. These include: Oranges. Lemons. Limes. Tomato-based foods. These include: Red sauce and pizza with red sauce. Chili. Salsa. Fried and fatty foods. These include: Donuts. French fries. Potato chips. High-fat dressings. High-fat meats. These include: Hot dogs and sausage. Rib eye steak. Ham and bacon. High-fat dairy items. These include: Whole milk. Butter. Cream cheese. Eat small meals often. Avoid eating big meals. Avoid drinking lots of liquid with your meals. Try not to eat meals during the 2-3 hours before bedtime. Try not to lie down right after you eat. Do  not exercise right after you eat. Lifestyle  If you're overweight, lose an amount of weight that's healthy for you. Ask your provider about a safe weight loss goal. Do not smoke, vape, or use nicotine  or tobacco. Wear loose clothes. Do not wear things that  are tight around your waist. When you sleep, try: Raising the head of your bed about 6 inches (15 cm). You can use a wedge to do this. Lying down on your left side. Try to lower your stress. If you need help doing this, ask your provider. General instructions Take your medicines only as told. Do not take aspirin  or ibuprofen  unless you're told to. Watch for any changes in your symptoms. Do not bend over if it makes your symptoms worse. Contact a health care provider if: You have new symptoms. You have trouble: Drinking. Swallowing. Eating. It hurts to swallow. You have wheezing. You have a cough that won't go away. Your voice is hoarse. Your symptoms don't get better with treatment. Get help right away if: You have pain all of a sudden in your: Arm. Neck. Jaw. Teeth. Back. You feel sweaty, dizzy, or light-headed all of a sudden. You faint. You have chest pain or shortness of breath. You vomit and the vomit is: Green, yellow, or black. Looks like blood or coffee grounds. Your poop is red, bloody, or black. These symptoms may be an emergency. Call 911 right away. Do not wait to see if the symptoms will go away. Do not drive yourself to the hospital. This information is not intended to replace advice given to you by your health care provider. Make sure you discuss any questions you have with your health care provider. Document Revised: 08/22/2023 Document Reviewed: 03/08/2023 [employer] Patient Education  2024 [employer].

## 2024-10-30 ENCOUNTER — Other Ambulatory Visit: Payer: Self-pay

## 2024-10-30 DIAGNOSIS — Z87898 Personal history of other specified conditions: Secondary | ICD-10-CM

## 2024-11-07 ENCOUNTER — Other Ambulatory Visit: Payer: Self-pay

## 2024-11-07 ENCOUNTER — Encounter: Payer: Self-pay | Admitting: Urology

## 2024-11-07 DIAGNOSIS — Z87898 Personal history of other specified conditions: Secondary | ICD-10-CM

## 2024-11-08 ENCOUNTER — Ambulatory Visit: Payer: Self-pay | Admitting: Urology

## 2024-11-08 LAB — PSA: Prostate Specific Ag, Serum: 3.3 ng/mL (ref 0.0–4.0)

## 2024-11-26 ENCOUNTER — Ambulatory Visit: Admitting: Urology

## 2024-12-11 ENCOUNTER — Ambulatory Visit: Admitting: Surgery
